# Patient Record
Sex: Male | Born: 1940
Health system: Southern US, Community
[De-identification: ages and names within clinical notes are randomized; demographics above are authoritative.]

## PROBLEM LIST (undated history)

## (undated) DIAGNOSIS — I4891 Unspecified atrial fibrillation: Secondary | ICD-10-CM

## (undated) DIAGNOSIS — N419 Inflammatory disease of prostate, unspecified: Secondary | ICD-10-CM

## (undated) DIAGNOSIS — I509 Heart failure, unspecified: Secondary | ICD-10-CM

## (undated) DIAGNOSIS — I639 Cerebral infarction, unspecified: Secondary | ICD-10-CM

## (undated) DIAGNOSIS — I8390 Asymptomatic varicose veins of unspecified lower extremity: Secondary | ICD-10-CM

## (undated) DIAGNOSIS — I2510 Atherosclerotic heart disease of native coronary artery without angina pectoris: Secondary | ICD-10-CM

## (undated) DIAGNOSIS — I714 Abdominal aortic aneurysm, without rupture, unspecified: Secondary | ICD-10-CM

## (undated) DIAGNOSIS — I10 Essential (primary) hypertension: Secondary | ICD-10-CM

## (undated) DIAGNOSIS — E079 Disorder of thyroid, unspecified: Secondary | ICD-10-CM

## (undated) DIAGNOSIS — I499 Cardiac arrhythmia, unspecified: Secondary | ICD-10-CM

## (undated) HISTORY — PX: INGUINAL HERNIA REPAIR: SUR1180

## (undated) HISTORY — DX: Atherosclerotic heart disease of native coronary artery without angina pectoris: I25.10

## (undated) HISTORY — DX: Asymptomatic varicose veins of unspecified lower extremity: I83.90

## (undated) HISTORY — DX: Unspecified atrial fibrillation: I48.91

## (undated) HISTORY — DX: Abdominal aortic aneurysm, without rupture, unspecified: I71.40

## (undated) HISTORY — DX: Inflammatory disease of prostate, unspecified: N41.9

## (undated) HISTORY — PX: ABLATION: SHX5711

## (undated) HISTORY — DX: Disorder of thyroid, unspecified: E07.9

## (undated) HISTORY — PX: TONSILLECTOMY AND ADENOIDECTOMY: SUR1326

## (undated) HISTORY — DX: Cardiac arrhythmia, unspecified: I49.9

## (undated) HISTORY — PX: ABDOMINAL AORTIC ANEURYSM REPAIR: SUR1152

## (undated) HISTORY — DX: Cerebral infarction, unspecified: I63.9

## (undated) HISTORY — DX: Heart failure, unspecified: I50.9

## (undated) HISTORY — DX: Abdominal aortic aneurysm, without rupture: I71.4

## (undated) HISTORY — DX: Essential (primary) hypertension: I10

---

## 1967-09-05 DIAGNOSIS — I639 Cerebral infarction, unspecified: Secondary | ICD-10-CM

## 1967-09-05 HISTORY — DX: Cerebral infarction, unspecified: I63.9

## 2006-10-15 ENCOUNTER — Ambulatory Visit: Payer: Self-pay | Admitting: Vascular Surgery

## 2006-11-20 ENCOUNTER — Ambulatory Visit: Payer: Self-pay | Admitting: Vascular Surgery

## 2006-11-21 ENCOUNTER — Inpatient Hospital Stay (HOSPITAL_COMMUNITY): Admission: RE | Admit: 2006-11-21 | Discharge: 2006-11-22 | Payer: Self-pay | Admitting: Vascular Surgery

## 2006-11-21 ENCOUNTER — Ambulatory Visit: Payer: Self-pay | Admitting: Vascular Surgery

## 2006-11-21 HISTORY — PX: CAROTID ENDARTERECTOMY: SUR193

## 2006-11-28 ENCOUNTER — Ambulatory Visit: Payer: Self-pay | Admitting: Vascular Surgery

## 2006-11-30 ENCOUNTER — Ambulatory Visit: Payer: Self-pay | Admitting: Vascular Surgery

## 2006-12-31 ENCOUNTER — Encounter: Admission: RE | Admit: 2006-12-31 | Discharge: 2006-12-31 | Payer: Self-pay | Admitting: Vascular Surgery

## 2006-12-31 ENCOUNTER — Ambulatory Visit: Payer: Self-pay | Admitting: Vascular Surgery

## 2007-02-12 ENCOUNTER — Ambulatory Visit: Payer: Self-pay | Admitting: Vascular Surgery

## 2007-06-28 ENCOUNTER — Encounter: Admission: RE | Admit: 2007-06-28 | Discharge: 2007-06-28 | Payer: Self-pay | Admitting: Vascular Surgery

## 2007-06-28 ENCOUNTER — Ambulatory Visit: Payer: Self-pay | Admitting: Vascular Surgery

## 2008-06-26 ENCOUNTER — Encounter: Admission: RE | Admit: 2008-06-26 | Discharge: 2008-06-26 | Payer: Self-pay

## 2008-06-26 ENCOUNTER — Ambulatory Visit: Payer: Self-pay | Admitting: Vascular Surgery

## 2009-07-16 ENCOUNTER — Ambulatory Visit: Payer: Self-pay | Admitting: Vascular Surgery

## 2011-01-17 NOTE — Procedures (Signed)
ENDOVASCULAR STENT GRAFT EXAM   INDICATION:  Followup of an abdominal aortic aneurysm repair endo stent.   HISTORY:                           DUPLEX EVALUATION   AAA Sac Size:                 5.5 CM AP             4.6 CM TRV  Previous Sac Size:            5.2 (CT) CM AP        CM TRV  Evidence of an endoleak?      No                    No   Velocity Criteria:  Proximal Aorta                66 cm/sec  Proximal Stent Graft          57 cm/sec  Main Body Stent Graft-Mid     55 cm/sec  Right Limb-Proximal           82 cm/sec  Right Limb-Distal             102 cm/sec  Left Limb-Proximal            87 cm/sec  Left Limb-Distal              88 cm/sec  Patent Renal Arteries?        Yes                   Yes   IMPRESSION:  1. No evidence of endo leak.  2. Patent graft with no evidence of stenosis.  3. Ankle brachial indices are within normal limits.   ___________________________________________  Larina Earthly, M.D.   CJ/MEDQ  D:  07/16/2009  T:  07/16/2009  Job:  [id number]

## 2011-01-17 NOTE — Assessment & Plan Note (Signed)
OFFICE VISIT   [name], [name]  DOB:  August 27, 1941                                       07/16/2009  ZOXWR#:[id number]   Allen [name] presents today for followup of his infrarenal abdominal  aortic aneurysm stent graft repair.  Surgery was in March 2008.  He has  no symptoms referable to his aneurysm.  He does have continued  difficulty with atrial fibrillation and is on chronic Coumadin for this.  He did have a TIA at that time of attempted ablation in the past as  well.  He also has history regarding thyroid management.  His review of  systems is otherwise unchanged.  He does not smoke having quit 15 years  ago.  He does have 2-4 alcohol drinks per week.   PHYSICAL EXAM:  Well-developed well-nourished white male appearing  stated age of 76.  He is in no acute distress.  Blood pressure is 127/84 pulse 59, temperature is 98.0.  HEENT:  His pupils are equal, round, and reactive to light.  His  extraocular movements are intact.  Conjunctivae are nonicteric.  NECK:  No JVD.  He has no bruits present.  He has no adenopathy.  LUNGS:  Clear bilaterally.  No rales, rhonchi, or wheezes.  CARDIOVASCULAR:  Heart is irregular with no murmurs present.  He does  have 2+ radial, 2+ femoral, 2+ popliteal, and 2+ dorsalis pedis pulses  bilaterally.  ABDOMINAL EXAM:  Soft, nontender.  No masses.  I do not feel an aneurysm  pulsation.  NEUROLOGICAL:  He is grossly intact with no focal weakness.   He did have questions regarding apparent .  She did have marked  calcification in his arteries at the time of cardiac catheterization.  I  explained that as long as he is not having deposits of plaque inside the  arteries that although this is not normal this should not cause any  difficulty.  He did undergo ultrasound today and I have reviewed this  and discussed this at length with [name].  I explained if this  shows no evidence of endoleak with all flow inside the  graft, he is not  having significant change his aneurysm size, and his ankle-arm index is  normal bilaterally.  I explained the significance of the ongoing  surveillance of his aneurysm and have recommended that we see him again  in 1 year with CAT scan for further evaluation.  He does have some calf  cramping and I explained that with normal ankle-arm indices that this is  not related to claudication.  He will see [place] again in 1 year.   Larina Earthly, M.D.  Electronically Signed   TFE/MEDQ  D:  07/16/2009  T:  07/19/2009  Job:  [id number]   cc:   Desmond Dike, M.D.

## 2011-01-17 NOTE — Assessment & Plan Note (Signed)
OFFICE VISIT   Allen, [name]  DOB:  1941-06-20                                       06/28/2007  ZOXWR#:[id number]   Allen [name] is in today for follow-up of his gore stent graft repair  of his abdominal aortic aneurysm 11/21/06. He continues to do quite  well. He reports that he has had some cardiac issues.  Underwent a  cardiac catheterization revealing a severe calcification but no stenosis  in his coronary vessels.  He has had no symptoms related to his  aneurysm.   PHYSICAL EXAMINATION:  Blood pressure is 117/77, pulse 72, respirations  16.  His radial pulses are 2+, femoral pulses are 2+, he has 2+  posterior tibial pulses bilaterally.  His popliteal pulses are normal  with no evidence of popliteal artery aneurysms.  Abdominal exam reveals  no evidence of pulsatile mass and no tenderness.  I reviewed his CAT  scan with him.  This shows maximal size of his aneurysm sack at 5.3 cm  which is slightly down from his preoperative study.  His stent graft is  in good positioning with no evidence of endo leak.  I am quite pleased  with his early follow-up and will see him again in one year with repeat  CAT scan at that time.   Larina Earthly, M.D.  Electronically Signed   TFE/MEDQ  D:  06/28/2007  T:  07/02/2007  Job:  [id number]   cc:   Debroah Baller, M.D.  Desmond Dike

## 2011-01-17 NOTE — Assessment & Plan Note (Signed)
OFFICE VISIT   Allen [name], Allen [name]  DOB:  1941-07-04                                       06/26/2008  NWGNF#:[id number]   The patient presents today for continued followup of his stent graft  repair of his infrarenal abdominal aortic aneurysm with a Gore Excluder  graft.  This was placed in March 2008.  Since my last visit with him, he  has had difficulty with cardiac arrhythmia, underwent percutaneous  treatment of this, but suffered slight stroke.  He reports that he has  had a finding of severe coronary calcific plaque, but no focal stenosis.  He is seen to be treated for hypertension.  He has no symptoms referable  to his aneurysm.   SOCIAL HISTORY:  He is divorced.  He is a retired Psychologist, occupational.  He quit  smoking 14 years ago and has 2 to 4 drinks per week.   REVIEW OF SYSTEMS:  Otherwise, negative.  He has no known allergies.   PHYSICAL EXAM:  He is a well developed, well nourished white male  appearing stated age of 62.  Blood pressure is 126/81, pulse 62,  respirations 18.  His groin incisions are well-healed bilaterally.  He  has 2+ femoral, 2+ popliteal, and 2+ posterior tibial pulses.  Abdominal  exam reveals no tenderness and no palpable masses.   He did undergo CAT scan today and I have reviewed this with him.  This  shows no evidence of endoleak.  He does have good positioning of his  stent graft and no enlargement of his aneurysm sac.  There does appear  to be slight shrinkage down to 5.2 cm.  I discussed all of this with the  patient.  I am quite pleased with his initial result.  We will see him  again in 1 year with ultrasound review of his aorta, and then in 2 years  for repeat CT scan.   Larina Earthly, M.D.  Electronically Signed   TFE/MEDQ  D:  06/26/2008  T:  06/29/2008  Job:  [id number]   cc:   Debroah Baller, M.D.  Dr. Desmond Dike

## 2011-01-20 NOTE — Op Note (Signed)
NAME:  ATHAN, CASALINO NO.:  [id number]   MEDICAL RECORD NO.:  [id number]           PATIENT TYPE:   LOCATION:                                 FACILITY:   PHYSICIAN:  Larina Earthly, M.D.         DATE OF BIRTH:   DATE OF PROCEDURE:  DATE OF DISCHARGE:                               OPERATIVE REPORT   PREOPERATIVE DIAGNOSIS:  Infrarenal abdominal aortic aneurysm.   POSTOPERATIVE DIAGNOSIS:  Infrarenal abdominal aortic aneurysm.   PROCEDURE:  Excluder stent graft repair of abdominal aortic aneurysm  using a 2-piece device.   SURGEON:  Dr. Tawanna Cooler Early.   ASSISTANT:  Dr. Fabienne Bruns.   ANESTHESIA:  General endotracheal.   COMPLICATIONS:  None.   DISPOSITION:  To recovery room in stable condition.   PROCEDURE IN DETAIL:  The patient was taken to the operating room and  placed in the supine position where the abdomen and both groins were  prepped and draped in the usual sterile fashion.  Oblique incisions were  made over the inguinal ligaments bilateral and carried down to isolate  the common femoral arteries bilaterally.  The left femoral cutdown  exposure and repair will be dictated as a separate note by Dr. Fabienne Bruns.  An 8-French sheath was passed through, with a Seldinger  technique, up to the level of the suprarenal aorta with a guidewire from  the right groin.  A marker pigtail catheter was positioned at the level  of the renal arteries and arteriogram was obtained showing positioning  of the renal arteries and the iliac artery and the iliac artery  bifurcation bilaterally.  A 26 diameter x 14-mm x 16-cm main body graft  was chosen.  This was positioned through the right groin.  A pigtail  catheter had an Amplatz Super Stiff wire placed through it and the  pigtail catheter was removed.  An 18-French sheath was passed over the  Amplatz Super Stiff wire and the main body device was positioned at the  level of the renal arteries.  A pigtail catheter  was positioned through  the left groin and again the arteriogram was exposed to show the level  of the renal arteries.  The main body device was deployed at the level  of the left renal artery which was the lowest renal artery.  Next the  pigtail catheter was pulled back down into the aneurysmal sac and a  pigtail catheter was exchanged for a headhunter catheter and a guidewire  and the contra-lateral gate was cannulated with the guidewire.  A  pigtail catheter was positioned over this and twirled in the main body  of the graft to confirm that this was indeed in the graft.  The contra-  lateral limb was 16-mm diameter x 11.5-cm length.  This was positioned  with the appropriate overlap and was deployed into the left iliac.  The  positioning of the hypogastric had been determined through a sheath  injection.  A CODA balloon was used to gently dilate the proximal distal  and junction anastomosis.  Completion of arteriogram  was obtained with  the pigtail catheter showing excellent positioning with good flow into  the native left renal artery and no evidence of Type 1 endo leak.  There  was a late blush from collaterals in the aneurysm sac.  The guidewire  and sheath were removed and the common femoral arteries were repaired  bilaterally with a running 5-0 and 6-0 Prolene sutures.  After  completion of this, the patient had been given  6000 units of intravenous heparin and this was reversed with 50 mg of  Protamine.  Wounds were irrigated with saline.  Excess cautery wounds  were closed with 3-0 Vicryl in the subcutaneous and subcuticular tissue.  Benzoin and Steri-Strips were applied.  The patient was taken to the  recovery room in stable condition.      Larina Earthly, M.D.  Electronically Signed     TFE/MEDQ  D:  11/21/2006  T:  11/21/2006  Job:  [id number]   cc:   Debroah Baller, M.D.  Norman Herrlich, MD  Desmond Dike, MD

## 2011-01-20 NOTE — Discharge Summary (Signed)
NAME:  Allen [name], STAN NO.:  [id number]   MEDICAL RECORD NO.:  [id number]          PATIENT TYPE:  INP   LOCATION:  2008                         FACILITY:  [facility]   PHYSICIAN:  Larina Earthly, M.D.    DATE OF BIRTH:  01/19/1941   DATE OF ADMISSION:  11/21/2006  DATE OF DISCHARGE:                               DISCHARGE SUMMARY   HISTORY OF PRESENT ILLNESS:  The patient is a 70 year old white male was  seen by Dr. Arbie Cookey on October 15, 2006, for evaluation of abdominal  aortic aneurysm, which was found on incidental findings during an  evaluation for chronic prostatitis.  A CT scan revealed an infrarenal  5.2 cm aneurysm.  It permeated at the level of the aortic bifurcation.  The patient elected after full discussion of all of his options to  undergo a skin graft repair and he was admitted this hospitalization for  the procedure.   PAST MEDICAL HISTORY:  1. Abdominal aortic aneurysm.  2. Chronic prostatitis.  3. Hypertension.  4. History of congestive heart failure.  5. Hypothyroidism.   PAST SURGICAL HISTORY:  1. Right inguinal hernia repair x4.  2. Tonsillectomy and adenoidectomy.  3. Circumcision.  4. History of some time of ablation that is nonspecific in the history      and physical.   ALLERGIES:  NO KNOWN DRUG ALLERGIES.   MEDICATIONS:  Prior to admission:  1. Lisinopril 20 mg daily.  2. Flomax 0.4 mg daily.  3. Aspirin 81 mg daily.  4. Metamucil 1 tablespoon q.day.  5. Synthroid 0.15 mg daily.  6. Valtrex 500 mg q.day.  7. Metoprolol 25 mg daily.   Family history, social history, review of systems, and physical  examination, please see the history and physical done at the time of  admission.   HOSPITAL COURSE:  The patient was admitted on November 21, 2006, taken to  the operating room at which time he underwent the placement of an  abdominal aortic aneurysm, gore excluder endoprosthetic stent graft.  This procedure was performed by Dr. Arbie Cookey,  tolerated well, and he was  taken to the post anesthesia care unit in stable condition.   POSTOPERATIVE HOSPITAL COURSE:  The patient has done remarkably well.  He has remained hemodynamically stable.   LABORATORY DATA:  Show a white blood cell count 13.4, hemoglobin and  hematocrit of 12 and 35 respectively with a platelet count of 213,000  postop day 1.  Electrolytes, BUN, and creatinine are all within normal  limits.  The incisions are healing well without evidence of infection or  hematoma.  The patient has palpable pedal pulses.  He has tolerated  routine advancement activity for level of postoperative convalescence  using standard protocols.  His oxygen has been weaned and he maintains  excellent saturations on room air.  He is afebrile.  He is tolerating  diet.  He is passing urine following the removal of his Foley catheter.  His overall status is felt to be stable for discharge on today's date  November 22, 2006.   CONDITION ON DISCHARGE:  Stable and improving.  DISCHARGE MEDICATIONS:  1. Include lisinopril 20 mg daily.  2. Synthroid 0.15 mg daily.  3. Valtrex 500 mg daily.  4. Metoprolol 25 mg daily.  5. Aspirin 1 daily as previously dosed.  6. Metamucil home dose.  7. Flomax 0.4 mg daily for pain.  8. Ultram 50 mg every 6 hours p.r.n.   FOLLOWUP:  Followup will be arranged for the patient to see Dr. Arbie Cookey at  the [facility] office.  This appointment will be called for the patient.   FINAL DIAGNOSIS:  Infrarenal abdominal aortic aneurysm now status post  stent graft repair.  Other diagnoses as previously left per the history.      Rowe Clack, P.A.-C.      Larina Earthly, M.D.  Electronically Signed    WEG/MEDQ  D:  11/22/2006  T:  11/22/2006  Job:  [id number]   cc:   Aundra Dubin. Revankar, M.D.  Geanie Berlin, M.D.

## 2011-01-20 NOTE — H&P (Signed)
NAME:  Allen [name], Allen [name] NO.:  [id number]   MEDICAL RECORD NO.:  [id number]          PATIENT TYPE:  INP   LOCATION:  NA                           FACILITY:  [facility]   PHYSICIAN:  Constance Holster, PA    DATE OF BIRTH:  [birth year]   DATE OF ADMISSION:  DATE OF DISCHARGE:                              HISTORY & PHYSICAL   PRIMARY DOCTOR:  Dr. Loralee Pacas.   CARDIOLOGIST:  Dr. Tomie China.   CHIEF COMPLAINT:  Abdominal aortic aneurysm.   HISTORY AND PHYSICAL:  This is a 70 year old Caucasian male who is seen  by Dr. Arbie Cookey on October 15, 2006, for evaluation of his abdominal  aortic aneurysm.  This was an incidental finding from a workup for  chronic prostatitis.  The patient was having a CT scan done which  revealed a 5.2-cm infrarenal abdominal aortic aneurysm.  This terminates  at the level of the aortic bifurcation.  Dr. Arbie Cookey recommended elective  repair of his abdominal aortic aneurysm.  After discussing open versus  stent graft, the decision was made for stent graft repair.  The patient  remains generally asymptomatic.  He denies any abdominal pain, no  nausea, no vomiting, hematochezia, hematemesis, claudication symptoms,  peripheral edema, dysuria, hematuria, reflux symptoms, angina,  palpitations, and TIA/CVA symptoms.  The patient does state that he has  had back pain for the past 10 years.  He notes this back pain when his  blood pressure is high.  He does not know if this is related to the  aneurysm or not.  The patient does also complain of constipation.  He  does have a history of arrhythmia.  The patient is unclear of the  arrhythmia.  However, it is thought to be atrial fibrillation, as he is  status post ablation.   The patient underwent a stress echocardiogram on October 24, 2006, at  [facility] which was negative inducible ischemia.  His left  ventricular function was within normal limits.  The patient presents  today for history and physical to  undergo elective repair of his  abdominal aortic aneurysm.  He will also have carotid Dopplers and ABIs  today in our office.   PAST MEDICAL HISTORY:  1. Abdominal aortic aneurysm.  2. Chronic prostatitis.  3. Hypertension.  4. Mild coronary artery disease with normal stress echocardiogram in      February of 2008.  5. History of congestive heart failure.  6. Hypothyroidism.   PAST SURGICAL HISTORY:  1. Right inguinal hernia repair times 4.  2. T and A.  3. Circumcision at age 39.  4. Ablation.   ALLERGIES:  No known drug allergies.   MEDICATIONS:  1. Lisinopril 20 mg p.o. daily.  2. Flomax 0.4 mg p.o. daily.  3. Aspirin 81 mg p.o. daily.  4. Metamucil 1 tablespoon daily.  5. Synthroid 0.15 mcg daily.  6. Valtrex 500 mg p.o. daily.  7. Metoprolol 25 mg p.o. daily.   REVIEW OF SYSTEMS:  See HPI for pertinent positive's and negative's.  Otherwise, negative for chronic obstructive pulmonary disease,  cerebrovascular accident, and myocardial infarction.  SOCIAL HISTORY:  The patient is single and lives alone.  He is a former  tobacco user.  However, he quit 12 years ago.  The patient does drink 4-  5 drinks per week.  He does still drive, and he lives in [place], [place].   FAMILY HISTORY:  Positive for cerebral aneurysm in his father.   PHYSICAL EXAMINATION:  VITAL SIGNS:  Blood pressure 103/80, heart rate  72, respirations 16.  GENERAL:  This is a 70 year old Caucasian male in non-acute distress.  HEENT:  Normocephalic and atraumatic.  Pupils are equal, round, and  reactive to light and accommodation.  Extraocular movements are intact.  Oral mucosa is pink and moist.  Sclerae are anicteric.  NECK:  Supple.  No carotid bruits heard on auscultation.  RESPIRATORY:  Symmetrical on aspiration.  Clear to auscultation  bilaterally.  CARDIAC:  Regular rate and rhythm.  No murmurs, rubs, or gallops.  ABDOMEN:  Soft, nontender, and nondistended.  Normoactive bowel  sounds  times 4.  GU/RECTAL:  Deferred.  EXTREMITIES:  No lower extremity edema.  Lower extremities are warm  bilaterally.  He has 2+ radial, femoral, popliteal, and posterior tibial  pulses bilaterally.  NEUROLOGIC:  Nonfocal.  Alert and oriented times 4.  Gait is steady.  Muscle strength is 5+ bilaterally and throughout.  Deep tendon reflexes  are 2+ and symmetrical.   ASSESSMENT:  A 5.2-cm infrarenal abdominal aortic aneurysm,  asymptomatic.   PLAN:  1. We will admit the patient to [facility] on November 21, 2006      under Dr. Bosie Helper service.  The patient will undergo abdominal      aortic Gore stent graft repair.  2. The risks and benefits were explained to the patient in great      detail.  Dr. Arbie Cookey has seen and evaluated the patient prior to his      admission and is in agreement with the above.      Constance Holster, PA     JMW/MEDQ  D:  11/20/2006  T:  11/20/2006  Job:  [id number]   cc:   Larina Earthly, M.D.

## 2011-01-20 NOTE — Op Note (Signed)
NAME:  Allen [name], Allen [name] NO.:  [id number]   MEDICAL RECORD NO.:  [id number]          PATIENT TYPE:  INP   LOCATION:  2550                         FACILITY:  [facility]   PHYSICIAN:  Janetta Hora. Fields, MD  DATE OF BIRTH:  12-14-1940   DATE OF PROCEDURE:  11/21/2006  DATE OF DISCHARGE:                               OPERATIVE REPORT   PROCEDURE:  Exposure and repair of left common femoral artery for  endovascular stent grafting.   PREOPERATIVE DIAGNOSIS:  Abdominal aortic aneurysm.   POSTOPERATIVE DIAGNOSIS:  Abdominal aortic aneurysm.   ANESTHESIA:  General.   OPERATIVE DETAILS:  After obtaining informed consent, the patient was  taken to the operating room.  The patient was placed in the supine  position on the operating table.  After induction of general anesthesia  and endotracheal intubation, a Foley catheter was placed.  Next, the  patient was prepped from the nipples to the knees.  An oblique incision  was made in the left groin just above the inguinal crease.  The incision  was carried down through the subcutaneous tissues down to the level of  the left common femoral artery.  This was dissected free  circumferentially.  Vessel loops were placed proximal and distal to the  site of cannulation.  Next, the left common femoral artery was  cannulated with the Majestic needle.  Guidewires and sheaths were placed  during the endovascular stent graft repair.  This is dictated separately  as an operative note by Dr. Arbie Cookey.  Next, after all sheaths and  guidewires were removed, the left common femoral artery was clamped  proximally with a Henley clamp.  It was controlled distally with a  vessel loop.  The arteriotomy was repaired using a running 6-0 Prolene  suture.  Just prior to completion of the anastomosis, this was forward  bled, back bled, and thoroughly flushed.  The anastomosis was secured,  clamps were released. There was good pulsatile flow in the femoral  artery immediately.  One additional 6-0 Prolene suture was placed as a  repair suture.  Next, the groin was closed in multiple layers with 2-0  and 3-0 Vicryl sutures.  The skin was then closed with 4-0 Vicryl  subcuticular stitch.  The patient tolerated the procedure well and there  were no complications.  Instrument, sponge and needle counts were  correct at the end of the case.  The patient was taken to the recovery  room in stable condition.      Janetta Hora. Fields, MD  Electronically Signed     CEF/MEDQ  D:  11/21/2006  T:  11/21/2006  Job:  [id number]

## 2011-09-05 HISTORY — PX: INGUINAL HERNIA REPAIR: SUR1180

## 2011-09-06 DIAGNOSIS — Z1211 Encounter for screening for malignant neoplasm of colon: Secondary | ICD-10-CM | POA: Diagnosis not present

## 2011-09-07 DIAGNOSIS — R109 Unspecified abdominal pain: Secondary | ICD-10-CM | POA: Diagnosis not present

## 2011-09-07 DIAGNOSIS — I2510 Atherosclerotic heart disease of native coronary artery without angina pectoris: Secondary | ICD-10-CM | POA: Diagnosis not present

## 2011-09-07 DIAGNOSIS — K4090 Unilateral inguinal hernia, without obstruction or gangrene, not specified as recurrent: Secondary | ICD-10-CM | POA: Diagnosis not present

## 2011-09-11 DIAGNOSIS — L820 Inflamed seborrheic keratosis: Secondary | ICD-10-CM | POA: Diagnosis not present

## 2011-09-11 DIAGNOSIS — D649 Anemia, unspecified: Secondary | ICD-10-CM | POA: Diagnosis not present

## 2011-09-20 DIAGNOSIS — R109 Unspecified abdominal pain: Secondary | ICD-10-CM | POA: Diagnosis not present

## 2011-09-20 DIAGNOSIS — Z79899 Other long term (current) drug therapy: Secondary | ICD-10-CM | POA: Diagnosis not present

## 2011-09-20 DIAGNOSIS — I10 Essential (primary) hypertension: Secondary | ICD-10-CM | POA: Diagnosis not present

## 2011-09-20 DIAGNOSIS — R339 Retention of urine, unspecified: Secondary | ICD-10-CM | POA: Diagnosis not present

## 2011-09-20 DIAGNOSIS — Z7901 Long term (current) use of anticoagulants: Secondary | ICD-10-CM | POA: Diagnosis not present

## 2011-09-20 DIAGNOSIS — K4091 Unilateral inguinal hernia, without obstruction or gangrene, recurrent: Secondary | ICD-10-CM | POA: Diagnosis not present

## 2011-09-20 DIAGNOSIS — R110 Nausea: Secondary | ICD-10-CM | POA: Diagnosis not present

## 2011-09-20 DIAGNOSIS — I4891 Unspecified atrial fibrillation: Secondary | ICD-10-CM | POA: Diagnosis not present

## 2011-09-25 DIAGNOSIS — N419 Inflammatory disease of prostate, unspecified: Secondary | ICD-10-CM | POA: Diagnosis not present

## 2011-09-25 DIAGNOSIS — R338 Other retention of urine: Secondary | ICD-10-CM | POA: Diagnosis not present

## 2011-09-25 DIAGNOSIS — N400 Enlarged prostate without lower urinary tract symptoms: Secondary | ICD-10-CM | POA: Diagnosis not present

## 2011-10-02 DIAGNOSIS — Z7901 Long term (current) use of anticoagulants: Secondary | ICD-10-CM | POA: Diagnosis not present

## 2011-10-02 DIAGNOSIS — I4892 Unspecified atrial flutter: Secondary | ICD-10-CM | POA: Diagnosis not present

## 2011-10-09 DIAGNOSIS — Z7901 Long term (current) use of anticoagulants: Secondary | ICD-10-CM | POA: Diagnosis not present

## 2011-10-09 DIAGNOSIS — I4891 Unspecified atrial fibrillation: Secondary | ICD-10-CM | POA: Diagnosis not present

## 2011-10-09 DIAGNOSIS — I4892 Unspecified atrial flutter: Secondary | ICD-10-CM | POA: Diagnosis not present

## 2011-10-23 DIAGNOSIS — Z7901 Long term (current) use of anticoagulants: Secondary | ICD-10-CM | POA: Diagnosis not present

## 2011-10-23 DIAGNOSIS — I4892 Unspecified atrial flutter: Secondary | ICD-10-CM | POA: Diagnosis not present

## 2011-11-20 DIAGNOSIS — Z7901 Long term (current) use of anticoagulants: Secondary | ICD-10-CM | POA: Diagnosis not present

## 2011-11-20 DIAGNOSIS — I4891 Unspecified atrial fibrillation: Secondary | ICD-10-CM | POA: Diagnosis not present

## 2011-11-27 DIAGNOSIS — N401 Enlarged prostate with lower urinary tract symptoms: Secondary | ICD-10-CM | POA: Diagnosis not present

## 2011-11-27 DIAGNOSIS — R21 Rash and other nonspecific skin eruption: Secondary | ICD-10-CM | POA: Diagnosis not present

## 2011-11-27 DIAGNOSIS — N411 Chronic prostatitis: Secondary | ICD-10-CM | POA: Diagnosis not present

## 2011-12-15 DIAGNOSIS — I4891 Unspecified atrial fibrillation: Secondary | ICD-10-CM | POA: Diagnosis not present

## 2011-12-15 DIAGNOSIS — Z7901 Long term (current) use of anticoagulants: Secondary | ICD-10-CM | POA: Diagnosis not present

## 2012-01-18 DIAGNOSIS — I4891 Unspecified atrial fibrillation: Secondary | ICD-10-CM | POA: Diagnosis not present

## 2012-01-18 DIAGNOSIS — Z7901 Long term (current) use of anticoagulants: Secondary | ICD-10-CM | POA: Diagnosis not present

## 2012-02-15 DIAGNOSIS — Z7901 Long term (current) use of anticoagulants: Secondary | ICD-10-CM | POA: Diagnosis not present

## 2012-02-15 DIAGNOSIS — I4892 Unspecified atrial flutter: Secondary | ICD-10-CM | POA: Diagnosis not present

## 2012-02-29 DIAGNOSIS — N401 Enlarged prostate with lower urinary tract symptoms: Secondary | ICD-10-CM | POA: Diagnosis not present

## 2012-02-29 DIAGNOSIS — N419 Inflammatory disease of prostate, unspecified: Secondary | ICD-10-CM | POA: Diagnosis not present

## 2012-02-29 DIAGNOSIS — Z125 Encounter for screening for malignant neoplasm of prostate: Secondary | ICD-10-CM | POA: Diagnosis not present

## 2012-03-04 DIAGNOSIS — H2510 Age-related nuclear cataract, unspecified eye: Secondary | ICD-10-CM | POA: Diagnosis not present

## 2012-03-18 DIAGNOSIS — I4891 Unspecified atrial fibrillation: Secondary | ICD-10-CM | POA: Diagnosis not present

## 2012-03-18 DIAGNOSIS — I4892 Unspecified atrial flutter: Secondary | ICD-10-CM | POA: Diagnosis not present

## 2012-03-18 DIAGNOSIS — I428 Other cardiomyopathies: Secondary | ICD-10-CM | POA: Diagnosis not present

## 2012-03-18 DIAGNOSIS — E785 Hyperlipidemia, unspecified: Secondary | ICD-10-CM | POA: Diagnosis not present

## 2012-03-19 DIAGNOSIS — I10 Essential (primary) hypertension: Secondary | ICD-10-CM | POA: Diagnosis not present

## 2012-03-19 DIAGNOSIS — I4892 Unspecified atrial flutter: Secondary | ICD-10-CM | POA: Diagnosis not present

## 2012-03-19 DIAGNOSIS — E785 Hyperlipidemia, unspecified: Secondary | ICD-10-CM | POA: Diagnosis not present

## 2012-04-10 DIAGNOSIS — D126 Benign neoplasm of colon, unspecified: Secondary | ICD-10-CM | POA: Diagnosis not present

## 2012-04-10 DIAGNOSIS — Z1211 Encounter for screening for malignant neoplasm of colon: Secondary | ICD-10-CM | POA: Diagnosis not present

## 2012-04-10 DIAGNOSIS — Z86010 Personal history of colonic polyps: Secondary | ICD-10-CM | POA: Diagnosis not present

## 2012-04-18 DIAGNOSIS — Z7901 Long term (current) use of anticoagulants: Secondary | ICD-10-CM | POA: Diagnosis not present

## 2012-04-18 DIAGNOSIS — I4892 Unspecified atrial flutter: Secondary | ICD-10-CM | POA: Diagnosis not present

## 2012-04-22 DIAGNOSIS — I4892 Unspecified atrial flutter: Secondary | ICD-10-CM | POA: Diagnosis not present

## 2012-04-22 DIAGNOSIS — Z7901 Long term (current) use of anticoagulants: Secondary | ICD-10-CM | POA: Diagnosis not present

## 2012-04-24 DIAGNOSIS — K921 Melena: Secondary | ICD-10-CM | POA: Diagnosis not present

## 2012-04-29 DIAGNOSIS — K921 Melena: Secondary | ICD-10-CM | POA: Diagnosis not present

## 2012-05-13 DIAGNOSIS — Z7901 Long term (current) use of anticoagulants: Secondary | ICD-10-CM | POA: Diagnosis not present

## 2012-05-13 DIAGNOSIS — I4891 Unspecified atrial fibrillation: Secondary | ICD-10-CM | POA: Diagnosis not present

## 2012-05-28 DIAGNOSIS — Z7901 Long term (current) use of anticoagulants: Secondary | ICD-10-CM | POA: Diagnosis not present

## 2012-05-28 DIAGNOSIS — I4891 Unspecified atrial fibrillation: Secondary | ICD-10-CM | POA: Diagnosis not present

## 2012-05-28 DIAGNOSIS — G459 Transient cerebral ischemic attack, unspecified: Secondary | ICD-10-CM | POA: Diagnosis not present

## 2012-05-31 DIAGNOSIS — K5900 Constipation, unspecified: Secondary | ICD-10-CM | POA: Diagnosis not present

## 2012-05-31 DIAGNOSIS — N419 Inflammatory disease of prostate, unspecified: Secondary | ICD-10-CM | POA: Diagnosis not present

## 2012-05-31 DIAGNOSIS — N401 Enlarged prostate with lower urinary tract symptoms: Secondary | ICD-10-CM | POA: Diagnosis not present

## 2012-06-11 DIAGNOSIS — Z7901 Long term (current) use of anticoagulants: Secondary | ICD-10-CM | POA: Diagnosis not present

## 2012-06-11 DIAGNOSIS — I4891 Unspecified atrial fibrillation: Secondary | ICD-10-CM | POA: Diagnosis not present

## 2012-06-11 DIAGNOSIS — I4892 Unspecified atrial flutter: Secondary | ICD-10-CM | POA: Diagnosis not present

## 2012-07-02 DIAGNOSIS — I4891 Unspecified atrial fibrillation: Secondary | ICD-10-CM | POA: Diagnosis not present

## 2012-07-02 DIAGNOSIS — Z7901 Long term (current) use of anticoagulants: Secondary | ICD-10-CM | POA: Diagnosis not present

## 2012-07-09 DIAGNOSIS — I4892 Unspecified atrial flutter: Secondary | ICD-10-CM | POA: Diagnosis not present

## 2012-07-09 DIAGNOSIS — Z7901 Long term (current) use of anticoagulants: Secondary | ICD-10-CM | POA: Diagnosis not present

## 2012-07-09 DIAGNOSIS — I4891 Unspecified atrial fibrillation: Secondary | ICD-10-CM | POA: Diagnosis not present

## 2012-07-15 ENCOUNTER — Encounter: Payer: Self-pay | Admitting: Vascular Surgery

## 2012-07-16 DIAGNOSIS — Z23 Encounter for immunization: Secondary | ICD-10-CM | POA: Diagnosis not present

## 2012-07-16 DIAGNOSIS — E039 Hypothyroidism, unspecified: Secondary | ICD-10-CM | POA: Diagnosis not present

## 2012-07-23 ENCOUNTER — Telehealth: Payer: Self-pay | Admitting: Vascular Surgery

## 2012-07-23 DIAGNOSIS — I4892 Unspecified atrial flutter: Secondary | ICD-10-CM | POA: Diagnosis not present

## 2012-07-23 DIAGNOSIS — I4891 Unspecified atrial fibrillation: Secondary | ICD-10-CM | POA: Diagnosis not present

## 2012-07-23 DIAGNOSIS — Z7901 Long term (current) use of anticoagulants: Secondary | ICD-10-CM | POA: Diagnosis not present

## 2012-07-23 NOTE — Telephone Encounter (Addendum)
Message copied by Shari Prows on Tue Jul 23, 2012  3:39 PM ------      Message from: Standley Dakins M      Created: Tue Jul 23, 2012 10:40 AM      Regarding: Schedule CTA       As per TFE/Chart audit pt needs a cta abd/pelvis follow up EVAR.  Patient is expecting your call.                  Juliette Alcide  I scheduled an appt for the above pt for 08/20/12 at 1:30pm. I have not scheduled the cta because I need further information from the patient. And also requested an order be placed into epic by Joyce Gross. I left a message for this patient regarding the above information as well as his insurance information,allergies,weight and diabetes as are required to schedule a cta. AWT

## 2012-07-24 ENCOUNTER — Other Ambulatory Visit: Payer: Self-pay | Admitting: *Deleted

## 2012-07-24 DIAGNOSIS — Z48812 Encounter for surgical aftercare following surgery on the circulatory system: Secondary | ICD-10-CM

## 2012-07-24 DIAGNOSIS — I714 Abdominal aortic aneurysm, without rupture: Secondary | ICD-10-CM

## 2012-07-29 DIAGNOSIS — I714 Abdominal aortic aneurysm, without rupture: Secondary | ICD-10-CM | POA: Diagnosis not present

## 2012-08-13 ENCOUNTER — Ambulatory Visit: Payer: Self-pay | Admitting: Vascular Surgery

## 2012-08-14 ENCOUNTER — Encounter: Payer: Self-pay | Admitting: Vascular Surgery

## 2012-08-19 ENCOUNTER — Encounter: Payer: Self-pay | Admitting: Vascular Surgery

## 2012-08-20 ENCOUNTER — Ambulatory Visit (INDEPENDENT_AMBULATORY_CARE_PROVIDER_SITE_OTHER): Payer: Medicare Other | Admitting: Vascular Surgery

## 2012-08-20 ENCOUNTER — Other Ambulatory Visit: Payer: Self-pay | Admitting: *Deleted

## 2012-08-20 ENCOUNTER — Ambulatory Visit
Admission: RE | Admit: 2012-08-20 | Discharge: 2012-08-20 | Disposition: A | Discharge status: Home / Self Care | Payer: Medicare Other | Source: Ambulatory Visit | Attending: Vascular Surgery | Admitting: Vascular Surgery

## 2012-08-20 ENCOUNTER — Encounter: Payer: Self-pay | Admitting: Vascular Surgery

## 2012-08-20 VITALS — BP 120/83 | HR 70 | Resp 18 | Ht 72.0 in | Wt 202.8 lb

## 2012-08-20 DIAGNOSIS — Z48812 Encounter for surgical aftercare following surgery on the circulatory system: Secondary | ICD-10-CM | POA: Diagnosis not present

## 2012-08-20 DIAGNOSIS — I714 Abdominal aortic aneurysm, without rupture, unspecified: Secondary | ICD-10-CM | POA: Insufficient documentation

## 2012-08-20 DIAGNOSIS — I83893 Varicose veins of bilateral lower extremities with other complications: Secondary | ICD-10-CM

## 2012-08-20 HISTORY — DX: Abdominal aortic aneurysm, without rupture, unspecified: I71.40

## 2012-08-20 MED ORDER — IOHEXOL 350 MG/ML SOLN
80.0000 mL | Freq: Once | INTRAVENOUS | Status: AC | PRN
  Administered 2012-08-20: 80 mL via INTRAVENOUS

## 2012-08-20 NOTE — Progress Notes (Signed)
The patient has today for followup of his stent graft repair of abdominal aortic aneurysm in 2008. He underwent a CT scan this morning and he is here for discussion of this today. He has no symptoms referable to this. His main problem is been recurrent inguinal hernias he has had multiple surgeries bilaterally regarding this. He has no symptoms regarding his aneurysm.  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 He reports these have become progressively painful over the last several years. He does have discomfort over these with prolonged standing and reports an achy sensation specifically over the veins at night after he has been on his feet for a great deal of time. He has not worn compression garments. He does not have any history of DVT or other difficulties. He does have some swelling in the left leg.  Past Medical History  Diagnosis Date  . Hypertension   . CVA (cerebral infarction) 1969  . Irregular heart beat   . AAA (abdominal aortic aneurysm)   . CHF (congestive heart failure)   . Prostatitis   . CAD (coronary artery disease)   . Thyroid disease     Hypothyrodidism  . Atrial fibrillation   . Varicose veins     History  Substance Use Topics  . Smoking status: Former Smoker    Types: Cigarettes    Quit date: 09/04/1993  . Smokeless tobacco: Never Used  . Alcohol Use: 0.0 oz/week    2-4 Glasses of wine per week    Family History  Problem Relation Age of Onset  . Heart disease Father     No Known Allergies  Current outpatient prescriptions:diclofenac (VOLTAREN) 75 MG EC tablet, Take 75 mg by mouth 2 (two) times daily. Takes 1 tablet 1 day after intercourse., Disp: , Rfl: ;  Levothyroxine Sodium (SYNTHROID PO), Take 175 mcg by mouth daily. , Disp: , Rfl: ;  lisinopril (PRINIVIL,ZESTRIL) 10 MG tablet, Take 10 mg by mouth daily., Disp: , Rfl: ;  metoprolol tartrate (LOPRESSOR) 25 MG tablet, Take 25 mg by mouth 2 (two) times daily.,  Disp: , Rfl:  sildenafil (VIAGRA) 25 MG tablet, Take 25 mg by mouth as needed., Disp: , Rfl: ;  silodosin (RAPAFLO) 8 MG CAPS capsule, Take 8 mg by mouth daily with breakfast. Pt. States he takes 1 tablet daily for 3 days after intercourse., Disp: , Rfl: ;  simvastatin (ZOCOR) 10 MG tablet, Take 10 mg by mouth at bedtime., Disp: , Rfl: ;  Sulfamethoxazole-Trimethoprim (BACTRIM PO), Take by mouth daily., Disp: , Rfl:  valACYclovir (VALTREX) 500 MG tablet, Take 400 mg by mouth 2 (two) times daily. Takes 1/2 tablet twice daily., Disp: , Rfl: ;  warfarin (COUMADIN) 4 MG tablet, Take 4 mg by mouth daily., Disp: , Rfl: ;  aspirin 81 MG tablet, Take 81 mg by mouth daily., Disp: , Rfl: ;  psyllium (METAMUCIL) 58.6 % powder, Take 1 packet by mouth 3 (three) times daily., Disp: , Rfl: ;  Tamsulosin HCl (FLOMAX) 0.4 MG CAPS, Take by mouth., Disp: , Rfl:   BP 120/83  Pulse 70  Resp 18  Ht 6' (1.829 m)  Wt 202 lb 12.8 oz (91.989 kg)  BMI 27.50 kg/m2  Body mass index is 27.50 kg/(m^2).       Physical exam: Well-developed well-nourished white male appearing stated age in no acute distress Heart regular rhythm without murmur Chest clear bilaterally without wheezes Neurologically he is grossly intact Abdomen soft nontender  he does have a nonpulsatile a palpable aneurysm. Femoral and popliteal pulses are 2+ bilaterally with no evidence of peripheral aneurysm He does have marked varicosities in his medial and posterior thigh extending to the popliteal space and down into his leg  CT scan was reviewed with the patient. This reveals continued diminished size and is negative aneurysm sac. This is now down to 3.9. He was 5.3 initially. There is no evidence of endoleak  Impression and plan stable followup of stent graft repair of abdominal aneurysm. I have recommended that we drop back to every 2 years CT scan for evaluation of this.  Patient does have symptomatic venous hypertension varicosities in his left  leg. We fitted today with thigh-high graduated compression garments 2030 mm mercury and instructed him on the use of these. We will see him again in 3 months to determine if this is successful treatment. He will also undergo formal venous duplex of his left leg at that time for further evaluation

## 2012-08-21 DIAGNOSIS — I4891 Unspecified atrial fibrillation: Secondary | ICD-10-CM | POA: Diagnosis not present

## 2012-08-21 DIAGNOSIS — Z7901 Long term (current) use of anticoagulants: Secondary | ICD-10-CM | POA: Diagnosis not present

## 2012-08-21 NOTE — Addendum Note (Signed)
Addended by: Sharee Pimple on: 08/21/2012 09:47 AM   Modules accepted: Orders

## 2012-09-05 DIAGNOSIS — Z79899 Other long term (current) drug therapy: Secondary | ICD-10-CM | POA: Diagnosis not present

## 2012-09-05 DIAGNOSIS — Z7901 Long term (current) use of anticoagulants: Secondary | ICD-10-CM | POA: Diagnosis not present

## 2012-09-05 DIAGNOSIS — I4891 Unspecified atrial fibrillation: Secondary | ICD-10-CM | POA: Diagnosis not present

## 2012-09-05 DIAGNOSIS — I4892 Unspecified atrial flutter: Secondary | ICD-10-CM | POA: Diagnosis not present

## 2012-09-09 DIAGNOSIS — N401 Enlarged prostate with lower urinary tract symptoms: Secondary | ICD-10-CM | POA: Diagnosis not present

## 2012-09-09 DIAGNOSIS — N419 Inflammatory disease of prostate, unspecified: Secondary | ICD-10-CM | POA: Diagnosis not present

## 2012-09-10 DIAGNOSIS — E039 Hypothyroidism, unspecified: Secondary | ICD-10-CM | POA: Diagnosis not present

## 2012-09-10 DIAGNOSIS — E7800 Pure hypercholesterolemia, unspecified: Secondary | ICD-10-CM | POA: Diagnosis not present

## 2012-09-10 DIAGNOSIS — I10 Essential (primary) hypertension: Secondary | ICD-10-CM | POA: Diagnosis not present

## 2012-09-26 DIAGNOSIS — I10 Essential (primary) hypertension: Secondary | ICD-10-CM | POA: Diagnosis not present

## 2012-09-26 DIAGNOSIS — I4891 Unspecified atrial fibrillation: Secondary | ICD-10-CM | POA: Diagnosis not present

## 2012-09-26 DIAGNOSIS — I4892 Unspecified atrial flutter: Secondary | ICD-10-CM | POA: Diagnosis not present

## 2012-09-26 DIAGNOSIS — E785 Hyperlipidemia, unspecified: Secondary | ICD-10-CM | POA: Diagnosis not present

## 2012-09-26 DIAGNOSIS — I8390 Asymptomatic varicose veins of unspecified lower extremity: Secondary | ICD-10-CM | POA: Diagnosis not present

## 2012-10-07 DIAGNOSIS — I4891 Unspecified atrial fibrillation: Secondary | ICD-10-CM | POA: Diagnosis not present

## 2012-10-07 DIAGNOSIS — Z7901 Long term (current) use of anticoagulants: Secondary | ICD-10-CM | POA: Diagnosis not present

## 2012-10-07 DIAGNOSIS — I4892 Unspecified atrial flutter: Secondary | ICD-10-CM | POA: Diagnosis not present

## 2012-10-28 DIAGNOSIS — I4892 Unspecified atrial flutter: Secondary | ICD-10-CM | POA: Diagnosis not present

## 2012-10-28 DIAGNOSIS — I4891 Unspecified atrial fibrillation: Secondary | ICD-10-CM | POA: Diagnosis not present

## 2012-10-28 DIAGNOSIS — Z7901 Long term (current) use of anticoagulants: Secondary | ICD-10-CM | POA: Diagnosis not present

## 2012-11-11 DIAGNOSIS — N419 Inflammatory disease of prostate, unspecified: Secondary | ICD-10-CM | POA: Diagnosis not present

## 2012-11-11 DIAGNOSIS — R3911 Hesitancy of micturition: Secondary | ICD-10-CM | POA: Diagnosis not present

## 2012-11-11 DIAGNOSIS — R351 Nocturia: Secondary | ICD-10-CM | POA: Diagnosis not present

## 2012-11-18 ENCOUNTER — Encounter: Payer: Self-pay | Admitting: Vascular Surgery

## 2012-11-19 ENCOUNTER — Ambulatory Visit (INDEPENDENT_AMBULATORY_CARE_PROVIDER_SITE_OTHER): Payer: Medicare Other | Admitting: Vascular Surgery

## 2012-11-19 ENCOUNTER — Encounter (INDEPENDENT_AMBULATORY_CARE_PROVIDER_SITE_OTHER): Payer: Medicare Other | Admitting: *Deleted

## 2012-11-19 ENCOUNTER — Encounter: Payer: Self-pay | Admitting: Vascular Surgery

## 2012-11-19 ENCOUNTER — Encounter: Payer: Medicare Other | Admitting: *Deleted

## 2012-11-19 ENCOUNTER — Other Ambulatory Visit: Payer: Self-pay | Admitting: *Deleted

## 2012-11-19 VITALS — BP 131/78 | HR 59 | Resp 18 | Ht 72.0 in | Wt 204.2 lb

## 2012-11-19 DIAGNOSIS — I83893 Varicose veins of bilateral lower extremities with other complications: Secondary | ICD-10-CM | POA: Diagnosis not present

## 2012-11-19 HISTORY — DX: Varicose veins of bilateral lower extremities with other complications: I83.893

## 2012-11-19 NOTE — Progress Notes (Signed)
Problems with Activities of Daily Living Secondary to Leg Pain  1. Allen [name] states that prolonged standing is very difficult for him due to leg pain.    2. Allen [name] states that he has difficulty falling asleep due to leg pain.  3.  Allen [name] states that traveling in automobile is very difficult due to leg pain.   Failure of  Conservative Therapy:  1. Worn 20-30 mm Hg thigh high compression hose >3 months with no relief of symptoms.  2. Frequently elevates legs-no relief of symptoms  3. Taken Ibuprofen 600 Mg TID with no relief of symptoms.  Patient tends to have bilateral lower surety discomfort despite graduated compression garment use. This is mainly over the large varicosities in his left medial thigh and right pretibial region. This causes him discomfort with prolonged standing and also an achy sensation when he goes to bed at night following long day of standing.  In reviewing his bilateral duplex. He does have gross reflux throughout his enlarged saphenous veins bilaterally and into these varicosities bilaterally.  I have recommended staged bilateral laser ablation of his great saphenous vein. The left leg is more symptomatic so we would proceed with this first. He understands this is under local anesthesia with tumescent anesthesia. He understands the slight risk of DVT associated with the procedure. He may require a staged Phlebectomy of the in large varicosities if he does not have complete resolution of his symptoms. We'll schedule this at his earliest convenience

## 2012-11-21 ENCOUNTER — Other Ambulatory Visit: Payer: Self-pay | Admitting: *Deleted

## 2012-11-21 DIAGNOSIS — I83893 Varicose veins of bilateral lower extremities with other complications: Secondary | ICD-10-CM

## 2012-11-26 ENCOUNTER — Other Ambulatory Visit: Payer: Self-pay | Admitting: *Deleted

## 2012-11-26 DIAGNOSIS — N401 Enlarged prostate with lower urinary tract symptoms: Secondary | ICD-10-CM | POA: Diagnosis not present

## 2012-11-26 DIAGNOSIS — R21 Rash and other nonspecific skin eruption: Secondary | ICD-10-CM | POA: Diagnosis not present

## 2012-11-26 DIAGNOSIS — I83893 Varicose veins of bilateral lower extremities with other complications: Secondary | ICD-10-CM

## 2012-11-27 ENCOUNTER — Encounter: Payer: Self-pay | Admitting: Vascular Surgery

## 2012-11-28 ENCOUNTER — Encounter: Payer: Self-pay | Admitting: Vascular Surgery

## 2012-11-28 ENCOUNTER — Ambulatory Visit (INDEPENDENT_AMBULATORY_CARE_PROVIDER_SITE_OTHER): Payer: Medicare Other | Admitting: Vascular Surgery

## 2012-11-28 ENCOUNTER — Other Ambulatory Visit: Payer: Self-pay | Admitting: *Deleted

## 2012-11-28 VITALS — BP 134/81 | HR 60 | Resp 16 | Ht 72.0 in | Wt 204.0 lb

## 2012-11-28 DIAGNOSIS — I83893 Varicose veins of bilateral lower extremities with other complications: Secondary | ICD-10-CM

## 2012-11-28 HISTORY — PX: ENDOVENOUS ABLATION SAPHENOUS VEIN W/ LASER: SUR449

## 2012-11-28 NOTE — Progress Notes (Signed)
Laser Ablation Procedure      Date: 11/28/2012    Giles Currie DOB:1941/05/19  Consent signed: Yes  Surgeon:T.F. Dwana Garin  Procedure: Laser Ablation: left Greater Saphenous Vein  BP 134/81  Pulse 60  Resp 16  Ht 6' (1.829 m)  Wt 204 lb (92.534 kg)  BMI 27.66 kg/m2  Start time: 8:35am   End time: 9:35am  Tumescent Anesthesia: 450 cc 0.9% NaCl with 50 cc Lidocaine HCL with 1% Epi and 15 cc 8.4% NaHCO3  Local Anesthesia: 3 cc Lidocaine HCL and NaHCO3 (ratio 2:1)  Continuous Mode: 15 WATTS [employer] 2667 JOULES Total Time2:57       Patient tolerated procedure well: Yes  Notes: Mr. Frith has not taken Coumadin since 11-23-2012. His last dose of Coumadin was on 11-22-2012.  Description of Procedure:  After marking the course of the saphenous vein and the secondary varicosities in the standing position, the patient was placed on the operating table in the supine position, and the left leg was prepped and draped in sterile fashion. Local anesthetic was administered, and under ultrasound guidance the saphenous vein was accessed with a micro needle and guide wire; then the micro puncture sheath was placed. A guide wire was inserted to the saphenofemoral junction, followed by a 5 french sheath.  The position of the sheath and then the laser fiber below the junction was confirmed using the ultrasound and visualization of the aiming beam.  Tumescent anesthesia was administered along the course of the saphenous vein using ultrasound guidance. Protective laser glasses were placed on the patient, and the laser was fired at at 15 watt continuous mode.  For a total of 2667 joules.  A steri strip was applied to the puncture site.       ABD pads and thigh high compression stockings were applied.  Ace wrap bandages were applied  at the top of the saphenofemoral junction.  Blood loss was less than 15 cc.  The patient ambulated out of the operating room having tolerated the procedure  well.  Uneventful laser ablation from proximal calf to just below the saphenofemoral junction

## 2012-11-29 ENCOUNTER — Telehealth: Payer: Self-pay | Admitting: *Deleted

## 2012-11-29 NOTE — Telephone Encounter (Signed)
11/29/2012  Time: 10:33 AM   Patient Name: Allen [name]  Patient of: T.F. Early  Procedure:Laser Ablation left greater saphenous vein  11-28-2012  Reached patient at home and checked  His status  Yes    Comments/Actions Taken: Mr. Arya states no problems with pain, bleeding, or swelling.  Reviewed post procedural instructions with him.  Reminded him of post laser ablation duplex and follow up appointment with Dr. Arbie Cookey on 12-05-2012.  Confirmed procedure date for endovenous laser ablation of second (right) leg for 12-26-2012.     @SIGNATURE @

## 2012-12-04 ENCOUNTER — Encounter: Payer: Self-pay | Admitting: Vascular Surgery

## 2012-12-05 ENCOUNTER — Ambulatory Visit (INDEPENDENT_AMBULATORY_CARE_PROVIDER_SITE_OTHER): Payer: Medicare Other | Admitting: Vascular Surgery

## 2012-12-05 ENCOUNTER — Encounter (INDEPENDENT_AMBULATORY_CARE_PROVIDER_SITE_OTHER): Payer: Medicare Other | Admitting: *Deleted

## 2012-12-05 ENCOUNTER — Encounter: Payer: Self-pay | Admitting: Vascular Surgery

## 2012-12-05 VITALS — BP 128/77 | HR 50 | Ht 72.0 in | Wt 205.7 lb

## 2012-12-05 DIAGNOSIS — I83893 Varicose veins of bilateral lower extremities with other complications: Secondary | ICD-10-CM

## 2012-12-05 DIAGNOSIS — Z48812 Encounter for surgical aftercare following surgery on the circulatory system: Secondary | ICD-10-CM | POA: Diagnosis not present

## 2012-12-05 NOTE — Progress Notes (Signed)
Patient presents today for followup of laser ablation of his left great saphenous vein from mid calf to saphenofemoral junction one week ago. He has been compliant with his graduated compression garments and use of ibuprofen. Has minimal discomfort associated with this  Past Medical History  Diagnosis Date  . Hypertension   . CVA (cerebral infarction) 1969  . Irregular heart beat   . AAA (abdominal aortic aneurysm)   . CHF (congestive heart failure)   . Prostatitis   . CAD (coronary artery disease)   . Thyroid disease     Hypothyrodidism  . Atrial fibrillation   . Varicose veins     History  Substance Use Topics  . Smoking status: Former Smoker    Types: Cigarettes    Quit date: 09/04/1993  . Smokeless tobacco: Never Used  . Alcohol Use: 0.0 oz/week    2-4 Glasses of wine per week    Family History  Problem Relation Age of Onset  . Heart disease Father     No Known Allergies  Current outpatient prescriptions:acyclovir (ZOVIRAX) 800 MG tablet, Take 400 mg by mouth 2 (two) times daily., Disp: , Rfl: ;  aspirin 81 MG tablet, Take 81 mg by mouth daily., Disp: , Rfl: ;  diclofenac (VOLTAREN) 75 MG EC tablet, Take 75 mg by mouth 2 (two) times daily. Takes 1 tablet 1 day after intercourse., Disp: , Rfl: ;  Levothyroxine Sodium (SYNTHROID PO), Take 175 mcg by mouth daily. , Disp: , Rfl:  lisinopril (PRINIVIL,ZESTRIL) 10 MG tablet, Take 20 mg by mouth daily. , Disp: , Rfl: ;  metoprolol tartrate (LOPRESSOR) 25 MG tablet, Take 25 mg by mouth 2 (two) times daily., Disp: , Rfl: ;  sildenafil (VIAGRA) 25 MG tablet, Take 25 mg by mouth as needed., Disp: , Rfl: ;  simvastatin (ZOCOR) 10 MG tablet, Take 10 mg by mouth at bedtime., Disp: , Rfl:  valACYclovir (VALTREX) 500 MG tablet, Take 400 mg by mouth 2 (two) times daily. Takes 1/2 tablet twice daily., Disp: , Rfl: ;  warfarin (COUMADIN) 4 MG tablet, Take 4 mg by mouth daily., Disp: , Rfl: ;  amoxicillin-clavulanate (AUGMENTIN) 500-125 MG per  tablet, Take 1 tablet by mouth 2 (two) times daily., Disp: , Rfl: ;  FINASTERIDE PO, Take 5 mg by mouth daily., Disp: , Rfl:  psyllium (METAMUCIL) 58.6 % powder, Take 1 packet by mouth 3 (three) times daily., Disp: , Rfl: ;  silodosin (RAPAFLO) 8 MG CAPS capsule, Take 8 mg by mouth daily with breakfast. Pt. States he takes 1 tablet daily for 3 days after intercourse., Disp: , Rfl: ;  Sulfamethoxazole-Trimethoprim (BACTRIM PO), Take by mouth daily., Disp: , Rfl: ;  Tamsulosin HCl (FLOMAX) 0.4 MG CAPS, Take by mouth., Disp: , Rfl:   BP 128/77  Pulse 50  Ht 6' (1.829 m)  Wt 205 lb 11.2 oz (93.305 kg)  BMI 27.89 kg/m2  SpO2 97%  Body mass index is 27.89 kg/(m^2).       Physical exam: Well-developed well-nourished gentleman in no acute distress. His ablation site shows minimal bruising and very mild erythema. The insertion site for the IV at the mid calf is healed.  Venous duplex today reveals closure of the saphenous vein from the insertion site mid calf to approximate 1 cm below the saphenofemoral junction. There is no evidence of DVT  Impression and plan: Excellent [facility] result from ablation of left great saphenous vein. He does have some decompression in the varicosities in his medial  thigh. He will continue his compression garment for one additional week. He is scheduled for similar treatment to the right great saphenous vein on 12/26/2012.

## 2012-12-09 DIAGNOSIS — N419 Inflammatory disease of prostate, unspecified: Secondary | ICD-10-CM | POA: Diagnosis not present

## 2012-12-09 DIAGNOSIS — N401 Enlarged prostate with lower urinary tract symptoms: Secondary | ICD-10-CM | POA: Diagnosis not present

## 2012-12-25 ENCOUNTER — Encounter: Payer: Self-pay | Admitting: Vascular Surgery

## 2012-12-26 ENCOUNTER — Ambulatory Visit (INDEPENDENT_AMBULATORY_CARE_PROVIDER_SITE_OTHER): Payer: Medicare Other | Admitting: Vascular Surgery

## 2012-12-26 ENCOUNTER — Encounter: Payer: Self-pay | Admitting: Vascular Surgery

## 2012-12-26 VITALS — BP 129/77 | HR 56 | Resp 16 | Ht 72.5 in | Wt 204.0 lb

## 2012-12-26 DIAGNOSIS — I83893 Varicose veins of bilateral lower extremities with other complications: Secondary | ICD-10-CM

## 2012-12-26 HISTORY — PX: ENDOVENOUS ABLATION SAPHENOUS VEIN W/ LASER: SUR449

## 2012-12-26 NOTE — Progress Notes (Signed)
Laser Ablation Procedure      Date: 12/26/2012    Allen [name] DOB:1940/09/14  Consent signed: Yes  Surgeon:T.F. Taige Housman  Procedure: Laser Ablation: right Greater Saphenous Vein  BP 129/77  Pulse 56  Resp 16  Ht 6' 0.5" (1.842 m)  Wt 204 lb (92.534 kg)  BMI 27.27 kg/m2  Start time: 8:40AM   End time: 9:35AM  Tumescent Anesthesia: 475 cc 0.9% NaCl with 50 cc Lidocaine HCL with 1% Epi and 15 cc 8.4% NaHCO3  Local Anesthesia: 4 cc Lidocaine HCL and NaHCO3 (ratio 2:1)  Continuous Mode: 15 Watts [employer] 2506 Joules Total Time2:47       Patient tolerated procedure well: Yes  Notes: [name] last dose of Coumadin was on 12-20-2012.  Description of Procedure:  After marking the course of the saphenous vein and the secondary varicosities in the standing position, the patient was placed on the operating table in the supine position, and the right leg was prepped and draped in sterile fashion. Local anesthetic was administered, and under ultrasound guidance the saphenous vein was accessed with a micro needle and guide wire; then the micro puncture sheath was placed. A guide wire was inserted to the saphenofemoral junction, followed by a 5 french sheath.  The position of the sheath and then the laser fiber below the junction was confirmed using the ultrasound and visualization of the aiming beam.  Tumescent anesthesia was administered along the course of the saphenous vein using ultrasound guidance. Protective laser glasses were placed on the patient, and the laser was fired at at 15 watt continuous mode.  For a total of 2506 joules.  A steri strip was applied to the puncture site.      ABD pads and thigh high compression stockings were applied.  Ace wrap bandages were applied  at the top of the saphenofemoral junction.  Blood loss was less than 15 cc.  The patient ambulated out of the operating room having tolerated the procedure well.  Followup Uneventful ablation of his  right great saphenous vein. He had similar treatment of left leg several weeks ago with no difficulty. This was from his proximal calf to just below the saphenofemoral junction. Will be seen again in one week for

## 2012-12-31 ENCOUNTER — Telehealth: Payer: Self-pay | Admitting: *Deleted

## 2012-12-31 NOTE — Telephone Encounter (Signed)
12/31/2012  Time: 1:50 PM   Patient Name: Allen [name]  Patient of: T.F. Early  Procedure:Laser Ablation right greater saphenous vein  12-26-2012  Reached patient at home and checked  His status  Yes    Comments/Actions Taken: [name] states that he has no problems with pain, swelling, or bleeding of right leg.  States he slept well last night.  Reviewed post procedural instructions and reminded him of follow up post laser ablation duplex and appointment with Dr. Arbie Cookey on 01-02-2013.    @SIGNATURE @

## 2013-01-01 ENCOUNTER — Encounter: Payer: Self-pay | Admitting: Vascular Surgery

## 2013-01-02 ENCOUNTER — Encounter (INDEPENDENT_AMBULATORY_CARE_PROVIDER_SITE_OTHER): Payer: Medicare Other | Admitting: *Deleted

## 2013-01-02 ENCOUNTER — Encounter: Payer: Self-pay | Admitting: Vascular Surgery

## 2013-01-02 ENCOUNTER — Ambulatory Visit (INDEPENDENT_AMBULATORY_CARE_PROVIDER_SITE_OTHER): Payer: Medicare Other | Admitting: Vascular Surgery

## 2013-01-02 VITALS — BP 142/89 | HR 56 | Resp 18 | Ht 72.5 in | Wt 204.0 lb

## 2013-01-02 DIAGNOSIS — Z48812 Encounter for surgical aftercare following surgery on the circulatory system: Secondary | ICD-10-CM

## 2013-01-02 DIAGNOSIS — I83893 Varicose veins of bilateral lower extremities with other complications: Secondary | ICD-10-CM | POA: Diagnosis not present

## 2013-01-02 DIAGNOSIS — I4891 Unspecified atrial fibrillation: Secondary | ICD-10-CM | POA: Diagnosis not present

## 2013-01-02 DIAGNOSIS — Z7901 Long term (current) use of anticoagulants: Secondary | ICD-10-CM | POA: Diagnosis not present

## 2013-01-02 NOTE — Progress Notes (Signed)
The patient presents today for followup of his laser ablation of his right great saphenous vein. He undergone similar treatment to his left great saphenous vein several weeks prior. He has minimal discomfort associated with this. He has been compliant with his compression garments.  Distal exam he has a minimal bruising associated with this. He does continue to have some tributary varicosities in his left thigh.  Venous duplex today reveals successful ablation of his right great saphenous vein from the distal insertion to just below the saphenofemoral junction.  Reviewed the study with the patient. He will wear his compression for one additional week and then an as-needed basis. He is still having some discomfort over tributary varicosities on the left. We will see him again in 3 months to determine if stab phlebectomy will be required.

## 2013-01-06 DIAGNOSIS — I4891 Unspecified atrial fibrillation: Secondary | ICD-10-CM | POA: Diagnosis not present

## 2013-01-06 DIAGNOSIS — Z7901 Long term (current) use of anticoagulants: Secondary | ICD-10-CM | POA: Diagnosis not present

## 2013-01-09 DIAGNOSIS — Z7901 Long term (current) use of anticoagulants: Secondary | ICD-10-CM | POA: Diagnosis not present

## 2013-01-09 DIAGNOSIS — R351 Nocturia: Secondary | ICD-10-CM | POA: Diagnosis not present

## 2013-01-09 DIAGNOSIS — N419 Inflammatory disease of prostate, unspecified: Secondary | ICD-10-CM | POA: Diagnosis not present

## 2013-01-09 DIAGNOSIS — I4891 Unspecified atrial fibrillation: Secondary | ICD-10-CM | POA: Diagnosis not present

## 2013-01-13 DIAGNOSIS — I4891 Unspecified atrial fibrillation: Secondary | ICD-10-CM | POA: Diagnosis not present

## 2013-01-13 DIAGNOSIS — Z7901 Long term (current) use of anticoagulants: Secondary | ICD-10-CM | POA: Diagnosis not present

## 2013-01-20 DIAGNOSIS — Z7901 Long term (current) use of anticoagulants: Secondary | ICD-10-CM | POA: Diagnosis not present

## 2013-01-20 DIAGNOSIS — I4892 Unspecified atrial flutter: Secondary | ICD-10-CM | POA: Diagnosis not present

## 2013-01-20 DIAGNOSIS — I4891 Unspecified atrial fibrillation: Secondary | ICD-10-CM | POA: Diagnosis not present

## 2013-02-03 DIAGNOSIS — Z7901 Long term (current) use of anticoagulants: Secondary | ICD-10-CM | POA: Diagnosis not present

## 2013-02-03 DIAGNOSIS — I4891 Unspecified atrial fibrillation: Secondary | ICD-10-CM | POA: Diagnosis not present

## 2013-03-03 ENCOUNTER — Encounter: Payer: Self-pay | Admitting: Vascular Surgery

## 2013-03-03 DIAGNOSIS — Z125 Encounter for screening for malignant neoplasm of prostate: Secondary | ICD-10-CM | POA: Diagnosis not present

## 2013-03-03 DIAGNOSIS — N401 Enlarged prostate with lower urinary tract symptoms: Secondary | ICD-10-CM | POA: Diagnosis not present

## 2013-03-03 DIAGNOSIS — N419 Inflammatory disease of prostate, unspecified: Secondary | ICD-10-CM | POA: Diagnosis not present

## 2013-03-04 ENCOUNTER — Encounter: Payer: Self-pay | Admitting: Vascular Surgery

## 2013-03-04 ENCOUNTER — Ambulatory Visit (INDEPENDENT_AMBULATORY_CARE_PROVIDER_SITE_OTHER): Payer: Medicare Other | Admitting: Vascular Surgery

## 2013-03-04 VITALS — BP 125/79 | HR 58 | Resp 18 | Ht 72.5 in | Wt 206.2 lb

## 2013-03-04 DIAGNOSIS — R109 Unspecified abdominal pain: Secondary | ICD-10-CM | POA: Diagnosis not present

## 2013-03-04 DIAGNOSIS — I83893 Varicose veins of bilateral lower extremities with other complications: Secondary | ICD-10-CM | POA: Diagnosis not present

## 2013-03-04 DIAGNOSIS — T148XXA Other injury of unspecified body region, initial encounter: Secondary | ICD-10-CM | POA: Diagnosis not present

## 2013-03-04 NOTE — Progress Notes (Signed)
Here today for followup of staged bilateral laser ablation of great saphenous veins. He has remained quite active. He does have improvement of his symptoms but continues to have a large plexus varicosities in his left posterior thigh extending past the popliteal space into his calf. This is causing him discomfort with prolonged standing despite continued use of elevation and compression. He has no symptoms of DVT.  Past Medical History  Diagnosis Date  . Hypertension   . CVA (cerebral infarction) 1969  . Irregular heart beat   . AAA (abdominal aortic aneurysm)   . CHF (congestive heart failure)   . Prostatitis   . CAD (coronary artery disease)   . Thyroid disease     Hypothyrodidism  . Atrial fibrillation   . Varicose veins     History  Substance Use Topics  . Smoking status: Former Smoker    Types: Cigarettes    Quit date: 09/04/1993  . Smokeless tobacco: Never Used  . Alcohol Use: 0.0 oz/week    2-4 Glasses of wine per week    Family History  Problem Relation Age of Onset  . Heart disease Father     No Known Allergies  Current outpatient prescriptions:acyclovir (ZOVIRAX) 800 MG tablet, Take 400 mg by mouth 2 (two) times daily., Disp: , Rfl: ;  FINASTERIDE PO, Take 5 mg by mouth daily., Disp: , Rfl: ;  Levothyroxine Sodium (SYNTHROID PO), Take 175 mcg by mouth daily. , Disp: , Rfl: ;  lisinopril (PRINIVIL,ZESTRIL) 10 MG tablet, Take 20 mg by mouth daily. , Disp: , Rfl:  metoprolol tartrate (LOPRESSOR) 25 MG tablet, Take 25 mg by mouth 2 (two) times daily., Disp: , Rfl: ;  sildenafil (VIAGRA) 25 MG tablet, Take 25 mg by mouth as needed., Disp: , Rfl: ;  silodosin (RAPAFLO) 8 MG CAPS capsule, Take 8 mg by mouth daily with breakfast. Pt. States he takes 1 tablet daily for 3 days after intercourse., Disp: , Rfl: ;  simvastatin (ZOCOR) 10 MG tablet, Take 10 mg by mouth at bedtime., Disp: , Rfl:  warfarin (COUMADIN) 4 MG tablet, Take 4 mg by mouth daily., Disp: , Rfl: ;   amoxicillin-clavulanate (AUGMENTIN) 500-125 MG per tablet, Take 1 tablet by mouth 2 (two) times daily., Disp: , Rfl: ;  aspirin 81 MG tablet, Take 81 mg by mouth daily., Disp: , Rfl: ;  diclofenac (VOLTAREN) 75 MG EC tablet, Take 75 mg by mouth 2 (two) times daily. Takes 1 tablet 1 day after intercourse., Disp: , Rfl:  psyllium (METAMUCIL) 58.6 % powder, Take 1 packet by mouth 3 (three) times daily., Disp: , Rfl: ;  Sulfamethoxazole-Trimethoprim (BACTRIM PO), Take by mouth daily., Disp: , Rfl: ;  Tamsulosin HCl (FLOMAX) 0.4 MG CAPS, Take by mouth., Disp: , Rfl: ;  valACYclovir (VALTREX) 500 MG tablet, Take 400 mg by mouth 2 (two) times daily. Takes 1/2 tablet twice daily., Disp: , Rfl:   BP 125/79  Pulse 58  Resp 18  Ht 6' 0.5" (1.842 m)  Wt 206 lb 3.2 oz (93.532 kg)  BMI 27.57 kg/m2  Body mass index is 27.57 kg/(m^2).       Physical exam well-developed gentleman in no acute distress Respirations nonlabored Neurologically he is grossly intact Palpable 2+ dorsalis pedis pulses bilaterally Large plexus of veins in the posterior left thigh popliteal space and calf  Impression and plan improved venous hypertension symptoms related to his laser ablation with persistent pain over the tributary varicosities. I have recommended stab phlebectomy of  these varicosities for relief of his symptoms. He understands this is an outpatient procedure. He will hold his Coumadin treatment for several days around the procedure. We will coordinate this at his earliest convenience

## 2013-03-05 DIAGNOSIS — H2589 Other age-related cataract: Secondary | ICD-10-CM | POA: Diagnosis not present

## 2013-03-06 DIAGNOSIS — I4891 Unspecified atrial fibrillation: Secondary | ICD-10-CM | POA: Diagnosis not present

## 2013-03-06 DIAGNOSIS — I4892 Unspecified atrial flutter: Secondary | ICD-10-CM | POA: Diagnosis not present

## 2013-03-06 DIAGNOSIS — Z7901 Long term (current) use of anticoagulants: Secondary | ICD-10-CM | POA: Diagnosis not present

## 2013-03-12 ENCOUNTER — Encounter: Payer: Self-pay | Admitting: Vascular Surgery

## 2013-03-13 ENCOUNTER — Encounter: Payer: Self-pay | Admitting: Vascular Surgery

## 2013-03-13 ENCOUNTER — Ambulatory Visit (INDEPENDENT_AMBULATORY_CARE_PROVIDER_SITE_OTHER): Payer: Medicare Other | Admitting: Vascular Surgery

## 2013-03-13 VITALS — BP 123/79 | HR 57 | Resp 16 | Ht 72.0 in | Wt 204.0 lb

## 2013-03-13 DIAGNOSIS — I83893 Varicose veins of bilateral lower extremities with other complications: Secondary | ICD-10-CM | POA: Diagnosis not present

## 2013-03-13 HISTORY — PX: OTHER SURGICAL HISTORY: SHX169

## 2013-03-13 NOTE — Progress Notes (Signed)
Stab Phlebectomy Procedure      Date: 03/13/2013    Allen [name] DOB:09-10-1940  Consent signed: Yes  Surgeon:T.F. Early    BP 123/79  Pulse 57  Resp 16  Ht 6' (1.829 m)  Wt 204 lb (92.534 kg)  BMI 27.66 kg/m2  Start time: 8:35am   End time: 9:30am  Tumescent Anesthesia: 200 cc 0.9% NaCl with 50 cc Lidocaine HCL with 1% Epi and 15 cc 8.4% NaHCO3  Local Anesthesia: 1 cc Lidocaine HCL and NaHCO3 (ratio 2:1)      Stab Phlebectomy: 10-20 incisions Sites: Thigh and Calf left leg  Patient tolerated procedure well: Yes  Notes: [name] states that his last dose of Coumadin was on 03-06-2013.  Description of Procedure:  After marking the course of the saphenous vein and the secondary varicosities in the standing position, the patient was placed on the operating table in the prone position, and the left leg was prepped and draped in sterile fashion. Local anesthetic was administered, The patient was then put into Trendelenburg position.  Local anesthetic was utilized overlying the marked varicosities.  Greater than 10-20 stab wounds were made using the tip of an 11 blade; and using the vein hook,  The phlebectomies were performed using a hemostat to avulse these varicosities.  Adequate hemostasis was achieved, and steri strips were applied to the stab wound.      ABD pads and thigh high compression stockings were applied.  Ace wrap bandages were applied over the phlebectomy sites.   Blood loss was less than 15 cc.  The patient ambulated out of the operating room having tolerated the procedure well.

## 2013-03-14 ENCOUNTER — Encounter: Payer: Self-pay | Admitting: Vascular Surgery

## 2013-03-20 DIAGNOSIS — I4891 Unspecified atrial fibrillation: Secondary | ICD-10-CM | POA: Diagnosis not present

## 2013-03-20 DIAGNOSIS — Z7901 Long term (current) use of anticoagulants: Secondary | ICD-10-CM | POA: Diagnosis not present

## 2013-03-24 ENCOUNTER — Telehealth: Payer: Self-pay | Admitting: *Deleted

## 2013-03-24 NOTE — Telephone Encounter (Signed)
03/24/2013  Time: 4:23 PM   Patient Name: Allen [name]  Patient of: T.F. Early  Procedure:stab phlebectomy 10-20 incisions left leg left  Reached patient at home and checked  His status  Yes    Comments/Actions Taken: [name] states that he is having no problems with bleeding or oozing. He states that he is experiencing mild/moderate pain around stab phlebectomy sites left leg. Encouraged him to elevate his left leg, keep compression dressing on left leg as directed, take Tylenol as needed for discomfort, use ice compression on left leg prn pain. Reviewed post procedural instructions with him and reminded him of follow up appointment with Dr. Arbie Cookey on 03-25-2013.     @SIGNATURE @

## 2013-03-25 ENCOUNTER — Ambulatory Visit: Payer: Medicare Other | Admitting: Vascular Surgery

## 2013-03-25 DIAGNOSIS — I4891 Unspecified atrial fibrillation: Secondary | ICD-10-CM | POA: Diagnosis not present

## 2013-03-25 DIAGNOSIS — Z7901 Long term (current) use of anticoagulants: Secondary | ICD-10-CM | POA: Diagnosis not present

## 2013-04-02 DIAGNOSIS — E785 Hyperlipidemia, unspecified: Secondary | ICD-10-CM | POA: Diagnosis not present

## 2013-04-02 DIAGNOSIS — Z7901 Long term (current) use of anticoagulants: Secondary | ICD-10-CM | POA: Diagnosis not present

## 2013-04-02 DIAGNOSIS — I4891 Unspecified atrial fibrillation: Secondary | ICD-10-CM | POA: Diagnosis not present

## 2013-04-02 DIAGNOSIS — I10 Essential (primary) hypertension: Secondary | ICD-10-CM | POA: Diagnosis not present

## 2013-04-03 DIAGNOSIS — I2510 Atherosclerotic heart disease of native coronary artery without angina pectoris: Secondary | ICD-10-CM | POA: Diagnosis not present

## 2013-04-03 DIAGNOSIS — R109 Unspecified abdominal pain: Secondary | ICD-10-CM | POA: Diagnosis not present

## 2013-04-03 DIAGNOSIS — I10 Essential (primary) hypertension: Secondary | ICD-10-CM | POA: Diagnosis not present

## 2013-04-03 DIAGNOSIS — J069 Acute upper respiratory infection, unspecified: Secondary | ICD-10-CM | POA: Diagnosis not present

## 2013-04-03 DIAGNOSIS — E039 Hypothyroidism, unspecified: Secondary | ICD-10-CM | POA: Diagnosis not present

## 2013-04-08 ENCOUNTER — Ambulatory Visit: Payer: Medicare Other | Admitting: Vascular Surgery

## 2013-04-15 DIAGNOSIS — Z7901 Long term (current) use of anticoagulants: Secondary | ICD-10-CM | POA: Diagnosis not present

## 2013-04-15 DIAGNOSIS — I4891 Unspecified atrial fibrillation: Secondary | ICD-10-CM | POA: Diagnosis not present

## 2013-04-16 DIAGNOSIS — D126 Benign neoplasm of colon, unspecified: Secondary | ICD-10-CM | POA: Diagnosis not present

## 2013-04-16 DIAGNOSIS — K5730 Diverticulosis of large intestine without perforation or abscess without bleeding: Secondary | ICD-10-CM | POA: Diagnosis not present

## 2013-04-16 DIAGNOSIS — Z86010 Personal history of colonic polyps: Secondary | ICD-10-CM | POA: Diagnosis not present

## 2013-04-24 DIAGNOSIS — N401 Enlarged prostate with lower urinary tract symptoms: Secondary | ICD-10-CM | POA: Diagnosis not present

## 2013-04-24 DIAGNOSIS — N419 Inflammatory disease of prostate, unspecified: Secondary | ICD-10-CM | POA: Diagnosis not present

## 2013-04-28 DIAGNOSIS — Z7901 Long term (current) use of anticoagulants: Secondary | ICD-10-CM | POA: Diagnosis not present

## 2013-04-28 DIAGNOSIS — I4891 Unspecified atrial fibrillation: Secondary | ICD-10-CM | POA: Diagnosis not present

## 2013-05-08 DIAGNOSIS — N419 Inflammatory disease of prostate, unspecified: Secondary | ICD-10-CM | POA: Diagnosis not present

## 2013-05-08 DIAGNOSIS — N401 Enlarged prostate with lower urinary tract symptoms: Secondary | ICD-10-CM | POA: Diagnosis not present

## 2013-05-09 DIAGNOSIS — Z7901 Long term (current) use of anticoagulants: Secondary | ICD-10-CM | POA: Diagnosis not present

## 2013-05-09 DIAGNOSIS — I4891 Unspecified atrial fibrillation: Secondary | ICD-10-CM | POA: Diagnosis not present

## 2013-06-03 DIAGNOSIS — Z23 Encounter for immunization: Secondary | ICD-10-CM | POA: Diagnosis not present

## 2013-06-07 DIAGNOSIS — R079 Chest pain, unspecified: Secondary | ICD-10-CM | POA: Diagnosis not present

## 2013-06-07 DIAGNOSIS — I4891 Unspecified atrial fibrillation: Secondary | ICD-10-CM | POA: Diagnosis not present

## 2013-06-07 DIAGNOSIS — R002 Palpitations: Secondary | ICD-10-CM | POA: Diagnosis not present

## 2013-06-07 DIAGNOSIS — E7800 Pure hypercholesterolemia, unspecified: Secondary | ICD-10-CM | POA: Diagnosis not present

## 2013-06-07 DIAGNOSIS — R5381 Other malaise: Secondary | ICD-10-CM | POA: Diagnosis not present

## 2013-06-07 DIAGNOSIS — I10 Essential (primary) hypertension: Secondary | ICD-10-CM | POA: Diagnosis not present

## 2013-06-09 DIAGNOSIS — Z7901 Long term (current) use of anticoagulants: Secondary | ICD-10-CM | POA: Diagnosis not present

## 2013-06-09 DIAGNOSIS — I4891 Unspecified atrial fibrillation: Secondary | ICD-10-CM | POA: Diagnosis not present

## 2013-06-12 DIAGNOSIS — R52 Pain, unspecified: Secondary | ICD-10-CM | POA: Diagnosis not present

## 2013-06-12 DIAGNOSIS — I10 Essential (primary) hypertension: Secondary | ICD-10-CM | POA: Diagnosis not present

## 2013-06-12 DIAGNOSIS — I4891 Unspecified atrial fibrillation: Secondary | ICD-10-CM | POA: Diagnosis not present

## 2013-06-25 DIAGNOSIS — I4891 Unspecified atrial fibrillation: Secondary | ICD-10-CM | POA: Diagnosis not present

## 2013-07-03 DIAGNOSIS — E039 Hypothyroidism, unspecified: Secondary | ICD-10-CM | POA: Diagnosis not present

## 2013-07-07 DIAGNOSIS — Z7901 Long term (current) use of anticoagulants: Secondary | ICD-10-CM | POA: Diagnosis not present

## 2013-07-07 DIAGNOSIS — I4891 Unspecified atrial fibrillation: Secondary | ICD-10-CM | POA: Diagnosis not present

## 2013-07-14 DIAGNOSIS — I4891 Unspecified atrial fibrillation: Secondary | ICD-10-CM | POA: Diagnosis not present

## 2013-07-16 DIAGNOSIS — Z7901 Long term (current) use of anticoagulants: Secondary | ICD-10-CM | POA: Diagnosis not present

## 2013-07-16 DIAGNOSIS — I4891 Unspecified atrial fibrillation: Secondary | ICD-10-CM | POA: Diagnosis not present

## 2013-08-07 DIAGNOSIS — N419 Inflammatory disease of prostate, unspecified: Secondary | ICD-10-CM | POA: Diagnosis not present

## 2013-08-07 DIAGNOSIS — R351 Nocturia: Secondary | ICD-10-CM | POA: Diagnosis not present

## 2013-08-13 DIAGNOSIS — I4891 Unspecified atrial fibrillation: Secondary | ICD-10-CM | POA: Diagnosis not present

## 2013-08-13 DIAGNOSIS — Z7901 Long term (current) use of anticoagulants: Secondary | ICD-10-CM | POA: Diagnosis not present

## 2013-09-10 DIAGNOSIS — I4891 Unspecified atrial fibrillation: Secondary | ICD-10-CM | POA: Diagnosis not present

## 2013-09-10 DIAGNOSIS — I4892 Unspecified atrial flutter: Secondary | ICD-10-CM | POA: Diagnosis not present

## 2013-09-10 DIAGNOSIS — Z7901 Long term (current) use of anticoagulants: Secondary | ICD-10-CM | POA: Diagnosis not present

## 2013-09-12 DIAGNOSIS — E785 Hyperlipidemia, unspecified: Secondary | ICD-10-CM | POA: Diagnosis not present

## 2013-09-12 DIAGNOSIS — I10 Essential (primary) hypertension: Secondary | ICD-10-CM | POA: Diagnosis not present

## 2013-09-12 DIAGNOSIS — I4891 Unspecified atrial fibrillation: Secondary | ICD-10-CM | POA: Diagnosis not present

## 2013-09-12 DIAGNOSIS — E039 Hypothyroidism, unspecified: Secondary | ICD-10-CM | POA: Diagnosis not present

## 2013-09-12 DIAGNOSIS — I4892 Unspecified atrial flutter: Secondary | ICD-10-CM | POA: Diagnosis not present

## 2013-09-12 DIAGNOSIS — Z8673 Personal history of transient ischemic attack (TIA), and cerebral infarction without residual deficits: Secondary | ICD-10-CM | POA: Diagnosis not present

## 2013-09-18 DIAGNOSIS — I4891 Unspecified atrial fibrillation: Secondary | ICD-10-CM | POA: Diagnosis not present

## 2013-09-24 DIAGNOSIS — I4891 Unspecified atrial fibrillation: Secondary | ICD-10-CM | POA: Diagnosis not present

## 2013-10-08 DIAGNOSIS — I4891 Unspecified atrial fibrillation: Secondary | ICD-10-CM | POA: Diagnosis not present

## 2013-10-08 DIAGNOSIS — Z7901 Long term (current) use of anticoagulants: Secondary | ICD-10-CM | POA: Diagnosis not present

## 2013-10-08 DIAGNOSIS — I959 Hypotension, unspecified: Secondary | ICD-10-CM | POA: Diagnosis not present

## 2013-10-08 DIAGNOSIS — E785 Hyperlipidemia, unspecified: Secondary | ICD-10-CM | POA: Diagnosis not present

## 2013-10-08 DIAGNOSIS — E039 Hypothyroidism, unspecified: Secondary | ICD-10-CM | POA: Diagnosis not present

## 2013-10-08 DIAGNOSIS — I10 Essential (primary) hypertension: Secondary | ICD-10-CM | POA: Diagnosis not present

## 2013-10-10 DIAGNOSIS — L570 Actinic keratosis: Secondary | ICD-10-CM | POA: Diagnosis not present

## 2013-10-10 DIAGNOSIS — L820 Inflamed seborrheic keratosis: Secondary | ICD-10-CM | POA: Diagnosis not present

## 2013-10-10 DIAGNOSIS — L988 Other specified disorders of the skin and subcutaneous tissue: Secondary | ICD-10-CM | POA: Diagnosis not present

## 2013-10-16 DIAGNOSIS — E785 Hyperlipidemia, unspecified: Secondary | ICD-10-CM | POA: Diagnosis not present

## 2013-10-16 DIAGNOSIS — I428 Other cardiomyopathies: Secondary | ICD-10-CM | POA: Diagnosis not present

## 2013-10-18 DIAGNOSIS — I4891 Unspecified atrial fibrillation: Secondary | ICD-10-CM | POA: Diagnosis not present

## 2013-10-18 DIAGNOSIS — R079 Chest pain, unspecified: Secondary | ICD-10-CM | POA: Diagnosis not present

## 2013-10-20 DIAGNOSIS — I4891 Unspecified atrial fibrillation: Secondary | ICD-10-CM | POA: Diagnosis not present

## 2013-10-23 DIAGNOSIS — I4891 Unspecified atrial fibrillation: Secondary | ICD-10-CM | POA: Diagnosis not present

## 2013-10-23 DIAGNOSIS — Z7901 Long term (current) use of anticoagulants: Secondary | ICD-10-CM | POA: Diagnosis not present

## 2013-11-03 DIAGNOSIS — I4891 Unspecified atrial fibrillation: Secondary | ICD-10-CM | POA: Diagnosis not present

## 2013-11-03 DIAGNOSIS — Z7901 Long term (current) use of anticoagulants: Secondary | ICD-10-CM | POA: Diagnosis not present

## 2013-11-06 DIAGNOSIS — H547 Unspecified visual loss: Secondary | ICD-10-CM | POA: Diagnosis not present

## 2013-11-13 DIAGNOSIS — I4892 Unspecified atrial flutter: Secondary | ICD-10-CM | POA: Diagnosis not present

## 2013-11-13 DIAGNOSIS — E785 Hyperlipidemia, unspecified: Secondary | ICD-10-CM | POA: Diagnosis not present

## 2013-11-13 DIAGNOSIS — E039 Hypothyroidism, unspecified: Secondary | ICD-10-CM | POA: Diagnosis not present

## 2013-11-13 DIAGNOSIS — I959 Hypotension, unspecified: Secondary | ICD-10-CM | POA: Diagnosis not present

## 2013-11-13 DIAGNOSIS — I10 Essential (primary) hypertension: Secondary | ICD-10-CM | POA: Diagnosis not present

## 2013-11-13 DIAGNOSIS — I4891 Unspecified atrial fibrillation: Secondary | ICD-10-CM | POA: Diagnosis not present

## 2013-11-25 DIAGNOSIS — Z7901 Long term (current) use of anticoagulants: Secondary | ICD-10-CM | POA: Diagnosis not present

## 2013-11-25 DIAGNOSIS — I4891 Unspecified atrial fibrillation: Secondary | ICD-10-CM | POA: Diagnosis not present

## 2013-11-26 DIAGNOSIS — N419 Inflammatory disease of prostate, unspecified: Secondary | ICD-10-CM | POA: Diagnosis not present

## 2013-11-26 DIAGNOSIS — K5900 Constipation, unspecified: Secondary | ICD-10-CM | POA: Diagnosis not present

## 2013-11-26 DIAGNOSIS — N138 Other obstructive and reflux uropathy: Secondary | ICD-10-CM | POA: Diagnosis not present

## 2013-11-26 DIAGNOSIS — N401 Enlarged prostate with lower urinary tract symptoms: Secondary | ICD-10-CM | POA: Diagnosis not present

## 2013-12-23 DIAGNOSIS — I4891 Unspecified atrial fibrillation: Secondary | ICD-10-CM | POA: Diagnosis not present

## 2013-12-23 DIAGNOSIS — Z7901 Long term (current) use of anticoagulants: Secondary | ICD-10-CM | POA: Diagnosis not present

## 2014-01-01 DIAGNOSIS — I10 Essential (primary) hypertension: Secondary | ICD-10-CM | POA: Diagnosis not present

## 2014-01-01 DIAGNOSIS — R109 Unspecified abdominal pain: Secondary | ICD-10-CM | POA: Diagnosis not present

## 2014-01-01 DIAGNOSIS — Z8673 Personal history of transient ischemic attack (TIA), and cerebral infarction without residual deficits: Secondary | ICD-10-CM | POA: Diagnosis not present

## 2014-01-01 DIAGNOSIS — R1084 Generalized abdominal pain: Secondary | ICD-10-CM | POA: Diagnosis not present

## 2014-01-07 DIAGNOSIS — R1031 Right lower quadrant pain: Secondary | ICD-10-CM | POA: Diagnosis not present

## 2014-01-07 DIAGNOSIS — K5730 Diverticulosis of large intestine without perforation or abscess without bleeding: Secondary | ICD-10-CM | POA: Diagnosis not present

## 2014-01-07 DIAGNOSIS — N400 Enlarged prostate without lower urinary tract symptoms: Secondary | ICD-10-CM | POA: Diagnosis not present

## 2014-01-08 DIAGNOSIS — R1031 Right lower quadrant pain: Secondary | ICD-10-CM | POA: Diagnosis not present

## 2014-01-08 DIAGNOSIS — R109 Unspecified abdominal pain: Secondary | ICD-10-CM | POA: Diagnosis not present

## 2014-01-15 DIAGNOSIS — R1031 Right lower quadrant pain: Secondary | ICD-10-CM | POA: Diagnosis not present

## 2014-01-15 DIAGNOSIS — R109 Unspecified abdominal pain: Secondary | ICD-10-CM | POA: Diagnosis not present

## 2014-01-20 DIAGNOSIS — I4891 Unspecified atrial fibrillation: Secondary | ICD-10-CM | POA: Diagnosis not present

## 2014-01-20 DIAGNOSIS — Z7901 Long term (current) use of anticoagulants: Secondary | ICD-10-CM | POA: Diagnosis not present

## 2014-02-19 DIAGNOSIS — I4891 Unspecified atrial fibrillation: Secondary | ICD-10-CM | POA: Diagnosis not present

## 2014-02-19 DIAGNOSIS — R109 Unspecified abdominal pain: Secondary | ICD-10-CM | POA: Diagnosis not present

## 2014-02-19 DIAGNOSIS — R1031 Right lower quadrant pain: Secondary | ICD-10-CM | POA: Diagnosis not present

## 2014-02-19 DIAGNOSIS — Z7901 Long term (current) use of anticoagulants: Secondary | ICD-10-CM | POA: Diagnosis not present

## 2014-02-26 DIAGNOSIS — N401 Enlarged prostate with lower urinary tract symptoms: Secondary | ICD-10-CM | POA: Diagnosis not present

## 2014-02-26 DIAGNOSIS — N138 Other obstructive and reflux uropathy: Secondary | ICD-10-CM | POA: Diagnosis not present

## 2014-02-26 DIAGNOSIS — N419 Inflammatory disease of prostate, unspecified: Secondary | ICD-10-CM | POA: Diagnosis not present

## 2014-02-26 DIAGNOSIS — R351 Nocturia: Secondary | ICD-10-CM | POA: Diagnosis not present

## 2014-03-05 DIAGNOSIS — Z125 Encounter for screening for malignant neoplasm of prostate: Secondary | ICD-10-CM | POA: Diagnosis not present

## 2014-03-19 DIAGNOSIS — I4891 Unspecified atrial fibrillation: Secondary | ICD-10-CM | POA: Diagnosis not present

## 2014-03-19 DIAGNOSIS — Z7901 Long term (current) use of anticoagulants: Secondary | ICD-10-CM | POA: Diagnosis not present

## 2014-03-26 DIAGNOSIS — I4891 Unspecified atrial fibrillation: Secondary | ICD-10-CM | POA: Diagnosis not present

## 2014-03-28 DIAGNOSIS — R5381 Other malaise: Secondary | ICD-10-CM | POA: Diagnosis not present

## 2014-03-28 DIAGNOSIS — R5383 Other fatigue: Secondary | ICD-10-CM | POA: Diagnosis not present

## 2014-03-28 DIAGNOSIS — M79609 Pain in unspecified limb: Secondary | ICD-10-CM | POA: Diagnosis not present

## 2014-03-30 DIAGNOSIS — E785 Hyperlipidemia, unspecified: Secondary | ICD-10-CM | POA: Diagnosis not present

## 2014-03-30 DIAGNOSIS — M25519 Pain in unspecified shoulder: Secondary | ICD-10-CM | POA: Diagnosis not present

## 2014-03-30 DIAGNOSIS — Z7901 Long term (current) use of anticoagulants: Secondary | ICD-10-CM | POA: Diagnosis not present

## 2014-03-30 DIAGNOSIS — I10 Essential (primary) hypertension: Secondary | ICD-10-CM | POA: Diagnosis not present

## 2014-03-30 DIAGNOSIS — I4891 Unspecified atrial fibrillation: Secondary | ICD-10-CM | POA: Diagnosis not present

## 2014-04-01 DIAGNOSIS — I10 Essential (primary) hypertension: Secondary | ICD-10-CM | POA: Diagnosis not present

## 2014-04-04 DIAGNOSIS — R0789 Other chest pain: Secondary | ICD-10-CM | POA: Diagnosis not present

## 2014-04-20 DIAGNOSIS — I4892 Unspecified atrial flutter: Secondary | ICD-10-CM | POA: Diagnosis not present

## 2014-04-20 DIAGNOSIS — I428 Other cardiomyopathies: Secondary | ICD-10-CM | POA: Diagnosis not present

## 2014-04-20 DIAGNOSIS — Z7901 Long term (current) use of anticoagulants: Secondary | ICD-10-CM | POA: Diagnosis not present

## 2014-04-20 DIAGNOSIS — I4891 Unspecified atrial fibrillation: Secondary | ICD-10-CM | POA: Diagnosis not present

## 2014-05-18 DIAGNOSIS — Z7901 Long term (current) use of anticoagulants: Secondary | ICD-10-CM | POA: Diagnosis not present

## 2014-05-18 DIAGNOSIS — I4891 Unspecified atrial fibrillation: Secondary | ICD-10-CM | POA: Diagnosis not present

## 2014-05-19 ENCOUNTER — Ambulatory Visit: Payer: Medicare Other | Admitting: Family

## 2014-05-25 ENCOUNTER — Encounter: Payer: Self-pay | Admitting: Family

## 2014-05-26 ENCOUNTER — Ambulatory Visit (INDEPENDENT_AMBULATORY_CARE_PROVIDER_SITE_OTHER): Payer: Medicare Other | Admitting: Family

## 2014-05-26 ENCOUNTER — Encounter: Payer: Self-pay | Admitting: Family

## 2014-05-26 VITALS — BP 132/80 | HR 48 | Resp 14 | Ht 72.0 in | Wt 207.0 lb

## 2014-05-26 DIAGNOSIS — I83893 Varicose veins of bilateral lower extremities with other complications: Secondary | ICD-10-CM

## 2014-05-26 NOTE — Progress Notes (Addendum)
Established Varicose Veins, S/P EVAR  History of Present Illness  Allen [name] is a 73 y.o. (11-16-1940) male patient of Dr. Donnetta Hutching who is s/p stab Phlebectomy: 10-20 incisions Sites: Thigh and Calf left leg on 03/13/13. He is also s/p EVAR several years ago. Pt does not know why he is here other than he was advised that he needs to be seen. After some investigation it appears that it has been over 2 years since he has been evaluated re his EVAR and needs a medical evaluation before his health insurance will cover the needed CTA of abdomen/pelvis. The patient denies abdominal or back pain, denies claudication symptoms with walking, denies non healing wounds. He does not have diabetes, he quit smoking in 1995. He takes coumadin, noted history of atrial fibrillation. He takes a daily statin.   Past Medical History  Diagnosis Date  . Hypertension   . CVA (cerebral infarction) 1969  . Irregular heart beat   . AAA (abdominal aortic aneurysm)   . CHF (congestive heart failure)   . Prostatitis   . CAD (coronary artery disease)   . Thyroid disease     Hypothyrodidism  . Atrial fibrillation   . Varicose veins     Social History History  Substance Use Topics  . Smoking status: Former Smoker    Types: Cigarettes    Quit date: 09/04/1993  . Smokeless tobacco: Never Used  . Alcohol Use: 0.0 oz/week    2-4 Glasses of wine per week    Family History Family History  Problem Relation Age of Onset  . Stroke Father   . Angina Mother   . Hypertension Sister   . Heart disease Sister     Surgical History Past Surgical History  Procedure Laterality Date  . Carotid endarterectomy  November 21, 2006    Left cea  . Inguinal hernia repair      X's  4  . Tonsillectomy and adenoidectomy    . Ablation    . Abdominal aortic aneurysm repair    . Inguinal hernia repair  09-2011    left side  . Endovenous ablation saphenous vein w/ laser Left 11-28-2012    left greater saphenous vein by  Curt Jews MD  . Endovenous ablation saphenous vein w/ laser Right 12-26-2012    right gretaer saphenous vein by Curt Jews MD  . Stab phlebectomy Left 03-13-2013    10-20 incisions by Curt Jews MD    No Known Allergies  Current Outpatient Prescriptions  Medication Sig Dispense Refill  . acyclovir (ZOVIRAX) 800 MG tablet Take 400 mg by mouth 2 (two) times daily.      . Levothyroxine Sodium (SYNTHROID PO) Take 175 mcg by mouth daily.       . metoprolol tartrate (LOPRESSOR) 25 MG tablet Take 25 mg by mouth 2 (two) times daily.      . propafenone (RYTHMOL) 150 MG tablet Take 150 mg by mouth 2 (two) times daily.      . sildenafil (VIAGRA) 25 MG tablet Take 25 mg by mouth as needed.      . silodosin (RAPAFLO) 8 MG CAPS capsule Take 8 mg by mouth daily with breakfast. Pt. States he takes 1 tablet daily for 3 days after intercourse.      . simvastatin (ZOCOR) 10 MG tablet Take 10 mg by mouth at bedtime.      Marland Kitchen warfarin (COUMADIN) 4 MG tablet Take 4 mg by mouth daily.      Marland Kitchen  amoxicillin-clavulanate (AUGMENTIN) 500-125 MG per tablet Take 1 tablet by mouth 2 (two) times daily.      Marland Kitchen aspirin 81 MG tablet Take 81 mg by mouth daily.      . diclofenac (VOLTAREN) 75 MG EC tablet Take 75 mg by mouth 2 (two) times daily. Takes 1 tablet 1 day after intercourse.      Marland Kitchen FINASTERIDE PO Take 5 mg by mouth daily.      Marland Kitchen lisinopril (PRINIVIL,ZESTRIL) 10 MG tablet Take 20 mg by mouth daily.       . psyllium (METAMUCIL) 58.6 % powder Take 1 packet by mouth 3 (three) times daily.      . Sulfamethoxazole-Trimethoprim (BACTRIM PO) Take by mouth daily.      . Tamsulosin HCl (FLOMAX) 0.4 MG CAPS Take by mouth.      . valACYclovir (VALTREX) 500 MG tablet Take 400 mg by mouth 2 (two) times daily. Takes 1/2 tablet twice daily.       No current facility-administered medications for this visit.    On ROS today: See HPI for pertinent positives and negatives.  Physical Examination  Filed Vitals:   05/26/14 1135    BP: 132/80  Pulse: 48  Resp: 14  Height: 6' (1.829 m)  Weight: 207 lb (93.895 kg)  SpO2: 99%   Body mass index is 28.07 kg/(m^2).  General: A&O x 3, WD.  Pulmonary: Sym exp, good air movt, CTAB, no rales, rhonchi, or wheezing.  Cardiac: RRR, Nl S1, S2, no detected murmur.  Vascular: Vessel Right Left  Radial 2+Palpable 2+Palpable  Carotid palpable without bruit Palpable without bruit  Aorta Not palpable N/A  Femoral 2+Palpable 2+Palpable  Popliteal Not palpable Not palpable  PT 2+Palpable 2+Palpable  DP 2+Palpable 2+Palpable   Gastrointestinal: soft, NTND, -G/R, - HSM, - masses, - CVAT B.  Musculoskeletal: M/S 5/5 throughout, Extremities without ischemic changes.  Neurologic: Pain and light touch intact in extremities, Motor exam as listed above.   Medical Decision Making  Allen [name] is a 73 y.o. male who presents s/p EVAR several years ago and needs routine surveillance of his EVAR site by CTA that is already scheduled for 09/01/14. After some effort, CTA of abdomen/pelvis performed on 01/07/14 at [facility] result was obtained which revealed stable post op changes are seen from stent graft repair of abdominal aortic aneurysm. The native aneurysm show further decrease in size measuring 3.7 cm compared to 4.0 cm previously on 08/20/12. Post op changes in the suprapubic anterior abdominal wall are also stable. This result was reviewed by and discussed with Dr. Donnetta Hutching. Patient will be notified that his stent graft repair of the AAA has decreased in size, is stable, and he will not need to return until May, 2017 at which time he is advised to schedule a Duplex of the stent graft and an appointment with me this same day following the Duplex to discuss the results and allow me to evaluate him.   Clemon Chambers, RN, MSN, FNP-C [facility] [place] Office: [phone]  Clinic MD: Early  05/26/2014, 11:52 AM

## 2014-05-29 ENCOUNTER — Telehealth: Payer: Self-pay | Admitting: Vascular Surgery

## 2014-05-29 ENCOUNTER — Encounter: Payer: Self-pay | Admitting: *Deleted

## 2014-05-29 ENCOUNTER — Other Ambulatory Visit: Payer: Self-pay | Admitting: *Deleted

## 2014-05-29 DIAGNOSIS — I714 Abdominal aortic aneurysm, without rupture, unspecified: Secondary | ICD-10-CM

## 2014-05-29 DIAGNOSIS — N419 Inflammatory disease of prostate, unspecified: Secondary | ICD-10-CM | POA: Diagnosis not present

## 2014-05-29 DIAGNOSIS — N401 Enlarged prostate with lower urinary tract symptoms: Secondary | ICD-10-CM | POA: Diagnosis not present

## 2014-05-29 DIAGNOSIS — Z48812 Encounter for surgical aftercare following surgery on the circulatory system: Secondary | ICD-10-CM

## 2014-05-29 NOTE — Telephone Encounter (Signed)
I cancelled the CT and f/u visit for 08/2014 and scheduled his EVAR duplex and f/u exam for 01/04/2016.  A letter was mailed to Allen [name] today.

## 2014-05-29 NOTE — Telephone Encounter (Signed)
Message copied by Rufina Falco on Fri May 29, 2014 10:45 AM ------      Message from: Viann Fish      Created: Thu May 28, 2014  7:38 PM      Regarding: cancel CTA abdomen/pelvis for for 09/01/14, etc       Zigmund Daniel,       Please call pt and let him know that Dr. Donnetta Hutching reviewed the CT result of his abdomen/pelvis done May of this year, and that the aortic stent graft site is stable, the aneurysm sac has decreased in size which is a good thing.      He will not need to have another CT in December, this needs to be canceled, and he does not need to return to see me or Dr. Donnetta Hutching in December.      He should follow up in May, 2017, with a stent graft Duplex of his abdomen here in our office non invasive vascular lab, and see me afterward to discuss the result and allow me to evaluate him.            Rip Harbour,      Please schedule his Duplex as above,            Thank you,      Vinnie Level                   ------

## 2014-06-12 DIAGNOSIS — Z7901 Long term (current) use of anticoagulants: Secondary | ICD-10-CM | POA: Diagnosis not present

## 2014-06-12 DIAGNOSIS — I4891 Unspecified atrial fibrillation: Secondary | ICD-10-CM | POA: Diagnosis not present

## 2014-06-22 DIAGNOSIS — Z23 Encounter for immunization: Secondary | ICD-10-CM | POA: Diagnosis not present

## 2014-06-23 DIAGNOSIS — I10 Essential (primary) hypertension: Secondary | ICD-10-CM | POA: Diagnosis not present

## 2014-06-23 DIAGNOSIS — Z8673 Personal history of transient ischemic attack (TIA), and cerebral infarction without residual deficits: Secondary | ICD-10-CM | POA: Diagnosis not present

## 2014-06-23 DIAGNOSIS — I4891 Unspecified atrial fibrillation: Secondary | ICD-10-CM | POA: Diagnosis not present

## 2014-06-23 DIAGNOSIS — I4892 Unspecified atrial flutter: Secondary | ICD-10-CM | POA: Diagnosis not present

## 2014-06-23 IMAGING — CT CT CTA ABD/PEL W/CM AND/OR W/O CM
2 of 9 series · 13 of 46 positions shown, 17 images · IV contrast (80CC OMNI 350)
Comparison: CT abdomen pelvis - 08/22/2011;

CLINICAL DATA: Post stent graft repair of AAA 7 years ago

CT ANGIOGRAPHY ABDOMEN AND PELVIS WITH CONTRAST AND WITHOUT
CONTRAST

[Series 4: angio · axial · 0.81mm/px · z∈[-455,-28]mm · 11 of 259 slices shown, 15 images]
[im 17/259  soft-tissue]
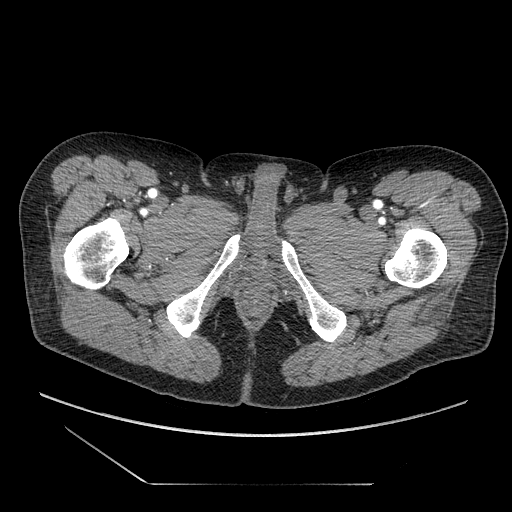
[im 17/259  bone]
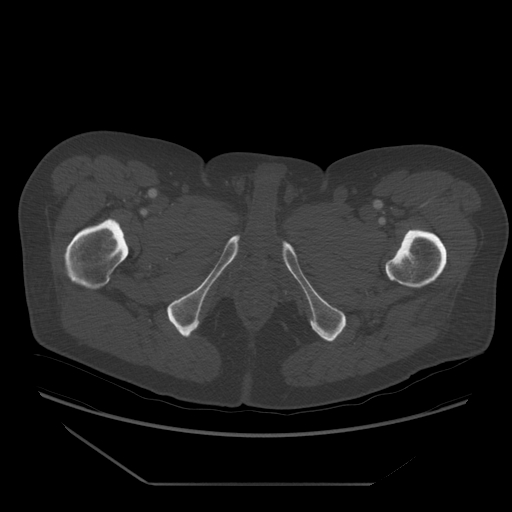
[im 49/259  soft-tissue]
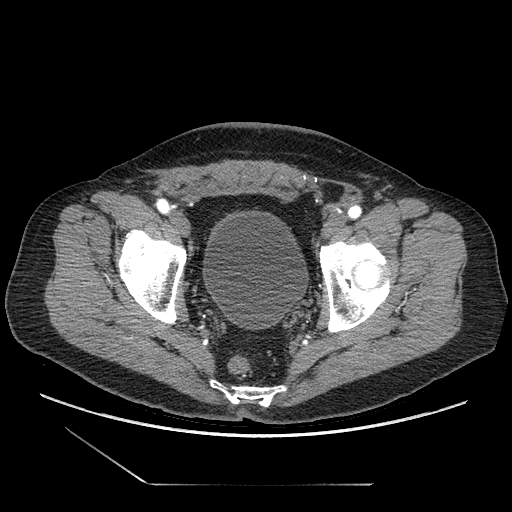
[im 81/259  soft-tissue]
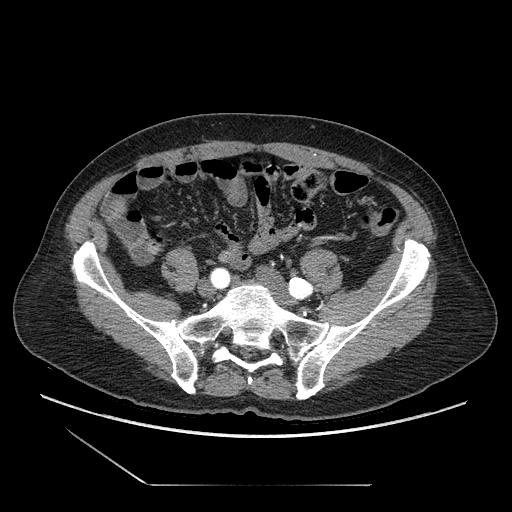
[im 97/259  soft-tissue]
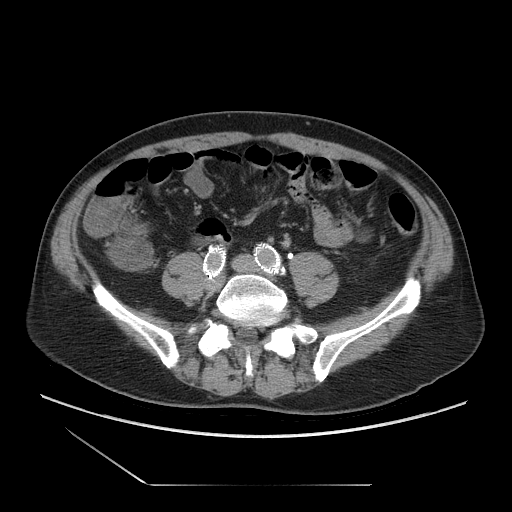
[im 130/259  soft-tissue]
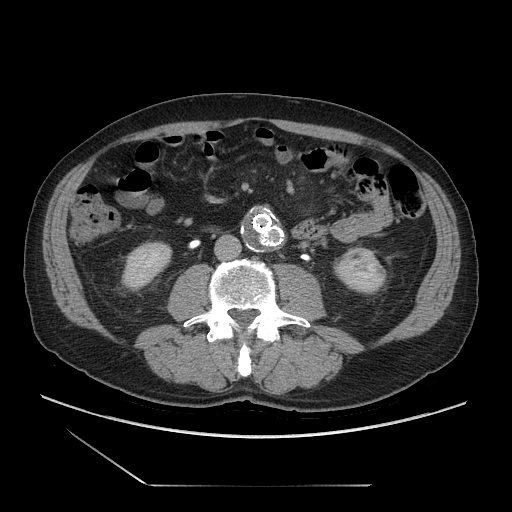
[im 162/259  soft-tissue]
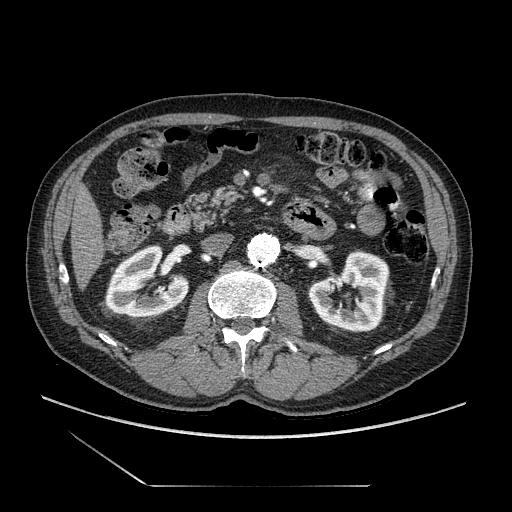
[im 178/259  soft-tissue]
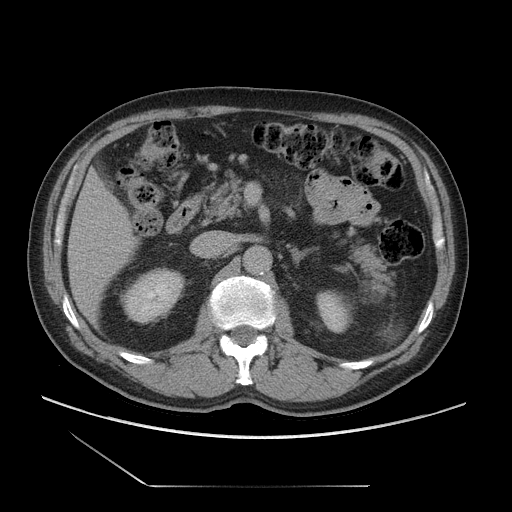
[im 194/259  lung]
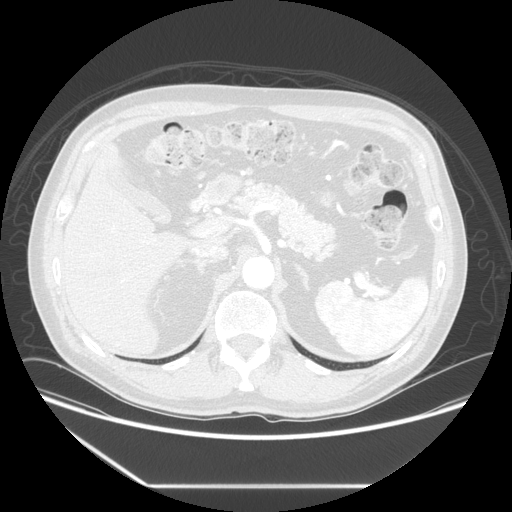
[im 210/259  soft-tissue]
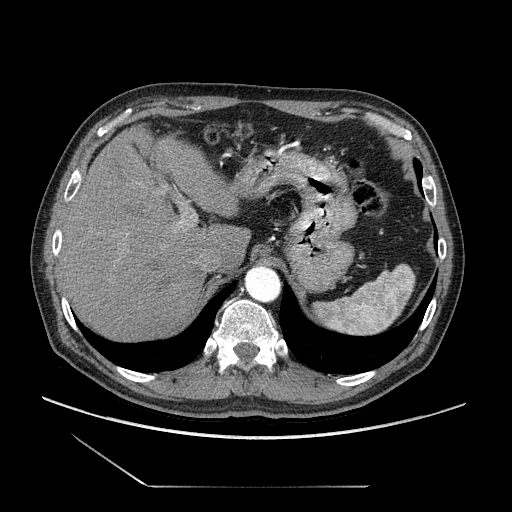
[im 210/259  lung]
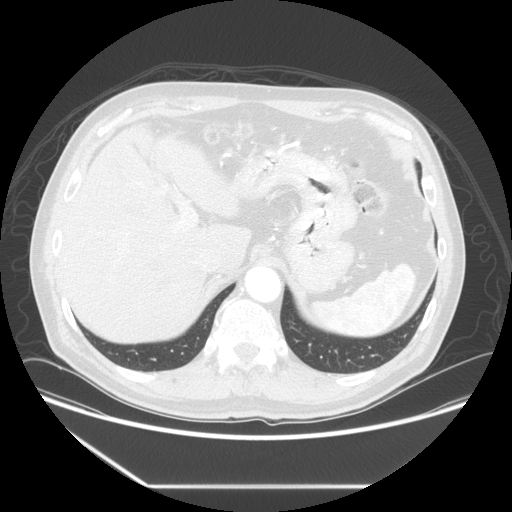
[im 226/259  lung]
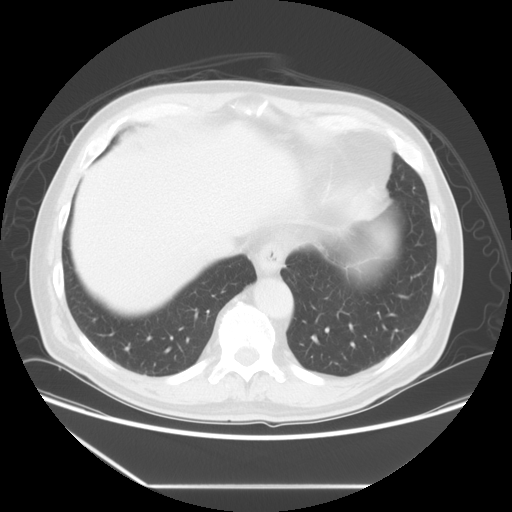
[im 242/259  soft-tissue]
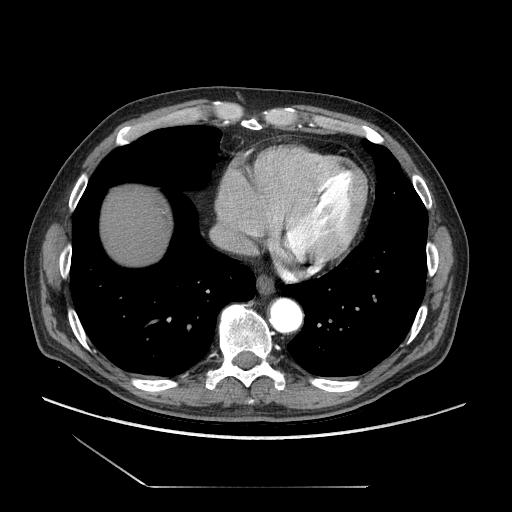
[im 242/259  lung]
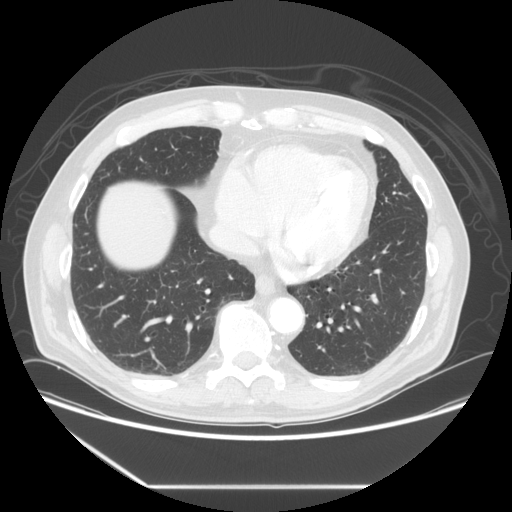
[im 242/259  bone]
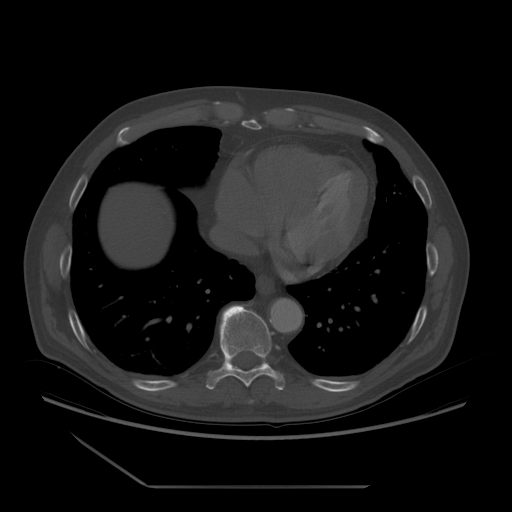

[Series 303: cor · coronal · 0.92mm/px · 2 of 115 slices shown]
[im 39/115  soft-tissue]
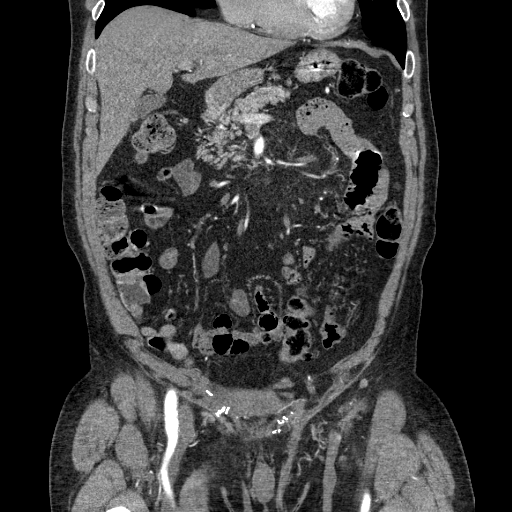
[im 77/115  soft-tissue]
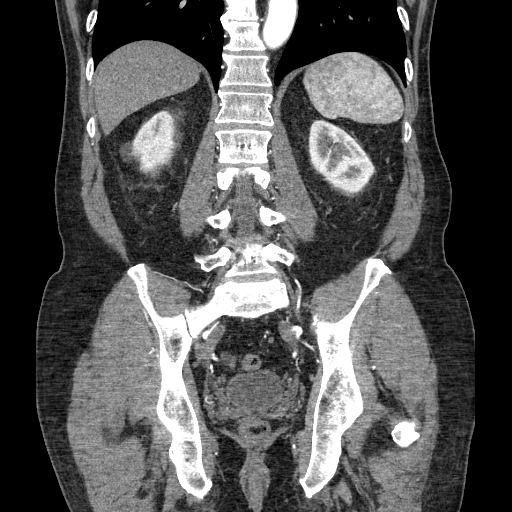

[13 of 46 positions shown; findings below may reference images not displayed]

06/26/2008

Vascular Findings:

Abdominal aorta: Stable sequela of endovascular stent graft repair
of infrarenal abdominal aortic aneurysm.  The stent graft is widely
patent.  The cranial and caudal ends of the stent graft are well
apposed against the respective walls of the infrarenal abdominal
aorta and bilateral common iliac arteries.  The excluded caliber of
the caliber of the excluded infrarenal abdominal aortic aneurysm
sac has continued to decrease in size, currently measuring
approximately 3.38 x 4.0 cm in greatest oblique axial dimension
(image 85, series four) previously, 4.9 x 5.3 cm.  There is no
evidence of endoleak.  No periaortic stranding.

Celiac artery:  Widely patent; conventional branching pattern.

SMA:  Widely patent; classical branching pattern.

Right renal artery:  Solitary; widely patent.

Solitary; widely patent.

IMA:  Occluded at its origin with early reconstitution via
hypertrophied collateral supply from the marginal artery Terence.

Pelvic vasculature:  Widely patent without hemodynamically
significant narrowing.

-------------------------------------------------------

Nonvascular findings:

Normal hepatic contour.  No discrete hepatic lesions.  The
gallbladder is decompressed but otherwise normal.  No definite
intra or extrahepatic biliary ductal dilatation.  No ascites.

There is symmetric enhancement and excretion of the bilateral
kidneys.  No renal stones.  Sub centimeter hypoattenuating lesions
within the inferior pole left kidney (coronal images 64 and 66,
series 303) are too small to accurately characterize.  Grossly
unchanged grossly symmetric bilateral likely a dilated perinephric
stranding.  No urinary obstruction.  Normal appearance of the
bilateral adrenal glands, pancreas and spleen.

There is extensive colonic diverticulosis without evidence of
diverticulitis.  The bowel is otherwise normal in appearance of
course and caliber without evidence of wall thickening or
obstruction.  Normal appearance of appendix.  No pneumoperitoneum,
pneumatosis or portal venous gas. There is unchanged mild haziness
of the abdominal mesentery with adenopathy.  No retroperitoneal,
mesenteric, pelvic or inguinal lymphadenopathy.

The prostate is enlarged of mass effect upon the undersurface of
the bladder.  No free fluid in the pelvis.

Limited visualization of the lower thorax demonstrates minimal
bibasilar subpleural atelectasis, right greater than left.  No
focal airspace opacities.  No pleural effusion.

Normal heart size.  Coronary artery calcifications is suspected.
No pericardial effusion.

No acute or aggressive osseous abnormalities.  Facet degenerative
change, worst at L4 - L5 and L5 - S1 bilaterally. Bone island
within the right pubic bone. Post bilateral inguinal hernia repair.
IMPRESSION: 1.  Stable stent graft repair of infrarenal abdominal aortic
aneurysm without evidence of endoleak.  Continued decrease in size
of excluded native abdominal aortic aneurysm sac, currently
measuring 3.3 x 4.0 cm, previously, 4.9 x 5.3 cm.
2.   Enlarged prostate with mass effect upon the undersurface of
the bladder.
3.  Extensive colonic diverticulosis without evidence of
diverticulitis.

## 2014-07-13 DIAGNOSIS — Z7901 Long term (current) use of anticoagulants: Secondary | ICD-10-CM | POA: Diagnosis not present

## 2014-07-13 DIAGNOSIS — I4891 Unspecified atrial fibrillation: Secondary | ICD-10-CM | POA: Diagnosis not present

## 2014-07-16 ENCOUNTER — Encounter: Payer: Self-pay | Admitting: Cardiology

## 2014-08-10 DIAGNOSIS — Z7901 Long term (current) use of anticoagulants: Secondary | ICD-10-CM | POA: Diagnosis not present

## 2014-08-14 ENCOUNTER — Encounter: Payer: Self-pay | Admitting: Cardiology

## 2014-08-25 ENCOUNTER — Ambulatory Visit: Payer: Medicare Other | Admitting: Vascular Surgery

## 2014-08-25 ENCOUNTER — Other Ambulatory Visit: Payer: Medicare Other

## 2014-09-01 ENCOUNTER — Other Ambulatory Visit: Payer: Medicare Other

## 2014-09-01 ENCOUNTER — Ambulatory Visit: Payer: Medicare Other | Admitting: Vascular Surgery

## 2014-09-08 DIAGNOSIS — Z0000 Encounter for general adult medical examination without abnormal findings: Secondary | ICD-10-CM | POA: Diagnosis not present

## 2014-09-08 DIAGNOSIS — L918 Other hypertrophic disorders of the skin: Secondary | ICD-10-CM | POA: Diagnosis not present

## 2014-09-08 DIAGNOSIS — D210 Benign neoplasm of connective and other soft tissue of head, face and neck: Secondary | ICD-10-CM | POA: Diagnosis not present

## 2014-09-08 DIAGNOSIS — L570 Actinic keratosis: Secondary | ICD-10-CM | POA: Diagnosis not present

## 2014-09-08 DIAGNOSIS — E039 Hypothyroidism, unspecified: Secondary | ICD-10-CM | POA: Diagnosis not present

## 2014-09-08 DIAGNOSIS — Z7901 Long term (current) use of anticoagulants: Secondary | ICD-10-CM | POA: Diagnosis not present

## 2014-09-09 DIAGNOSIS — N401 Enlarged prostate with lower urinary tract symptoms: Secondary | ICD-10-CM | POA: Diagnosis not present

## 2014-09-09 DIAGNOSIS — N419 Inflammatory disease of prostate, unspecified: Secondary | ICD-10-CM | POA: Diagnosis not present

## 2014-09-09 DIAGNOSIS — K5900 Constipation, unspecified: Secondary | ICD-10-CM | POA: Diagnosis not present

## 2014-09-21 DIAGNOSIS — Z23 Encounter for immunization: Secondary | ICD-10-CM | POA: Diagnosis not present

## 2014-10-06 DIAGNOSIS — Z7901 Long term (current) use of anticoagulants: Secondary | ICD-10-CM | POA: Diagnosis not present

## 2014-10-09 ENCOUNTER — Encounter: Payer: Self-pay | Admitting: Cardiology

## 2014-10-21 DIAGNOSIS — I7102 Dissection of abdominal aorta: Secondary | ICD-10-CM | POA: Diagnosis not present

## 2014-10-22 DIAGNOSIS — E784 Other hyperlipidemia: Secondary | ICD-10-CM | POA: Diagnosis not present

## 2014-10-22 DIAGNOSIS — I480 Paroxysmal atrial fibrillation: Secondary | ICD-10-CM | POA: Diagnosis not present

## 2014-10-27 DIAGNOSIS — N419 Inflammatory disease of prostate, unspecified: Secondary | ICD-10-CM | POA: Diagnosis not present

## 2014-10-27 DIAGNOSIS — N401 Enlarged prostate with lower urinary tract symptoms: Secondary | ICD-10-CM | POA: Diagnosis not present

## 2014-10-27 DIAGNOSIS — K5900 Constipation, unspecified: Secondary | ICD-10-CM | POA: Diagnosis not present

## 2014-11-04 DIAGNOSIS — Z7901 Long term (current) use of anticoagulants: Secondary | ICD-10-CM | POA: Diagnosis not present

## 2014-11-12 DIAGNOSIS — Z0000 Encounter for general adult medical examination without abnormal findings: Secondary | ICD-10-CM | POA: Diagnosis not present

## 2014-11-12 DIAGNOSIS — Z79899 Other long term (current) drug therapy: Secondary | ICD-10-CM | POA: Diagnosis not present

## 2014-11-12 DIAGNOSIS — E039 Hypothyroidism, unspecified: Secondary | ICD-10-CM | POA: Diagnosis not present

## 2014-12-02 DIAGNOSIS — Z7901 Long term (current) use of anticoagulants: Secondary | ICD-10-CM | POA: Diagnosis not present

## 2014-12-11 DIAGNOSIS — K5900 Constipation, unspecified: Secondary | ICD-10-CM | POA: Diagnosis not present

## 2014-12-11 DIAGNOSIS — G9611 Dural tear: Secondary | ICD-10-CM | POA: Diagnosis not present

## 2014-12-11 DIAGNOSIS — N401 Enlarged prostate with lower urinary tract symptoms: Secondary | ICD-10-CM | POA: Diagnosis not present

## 2014-12-11 DIAGNOSIS — N419 Inflammatory disease of prostate, unspecified: Secondary | ICD-10-CM | POA: Diagnosis not present

## 2015-01-01 DIAGNOSIS — I7102 Dissection of abdominal aorta: Secondary | ICD-10-CM | POA: Diagnosis not present

## 2015-01-01 DIAGNOSIS — Z7901 Long term (current) use of anticoagulants: Secondary | ICD-10-CM | POA: Diagnosis not present

## 2015-01-18 DIAGNOSIS — N419 Inflammatory disease of prostate, unspecified: Secondary | ICD-10-CM | POA: Diagnosis not present

## 2015-01-18 DIAGNOSIS — N401 Enlarged prostate with lower urinary tract symptoms: Secondary | ICD-10-CM | POA: Diagnosis not present

## 2015-01-29 DIAGNOSIS — I482 Chronic atrial fibrillation: Secondary | ICD-10-CM | POA: Diagnosis not present

## 2015-02-12 DIAGNOSIS — I482 Chronic atrial fibrillation: Secondary | ICD-10-CM | POA: Diagnosis not present

## 2015-02-12 DIAGNOSIS — Z7901 Long term (current) use of anticoagulants: Secondary | ICD-10-CM | POA: Diagnosis not present

## 2015-02-26 DIAGNOSIS — N419 Inflammatory disease of prostate, unspecified: Secondary | ICD-10-CM | POA: Diagnosis not present

## 2015-02-26 DIAGNOSIS — R3911 Hesitancy of micturition: Secondary | ICD-10-CM | POA: Diagnosis not present

## 2015-02-26 DIAGNOSIS — N401 Enlarged prostate with lower urinary tract symptoms: Secondary | ICD-10-CM | POA: Diagnosis not present

## 2015-03-05 DIAGNOSIS — H2513 Age-related nuclear cataract, bilateral: Secondary | ICD-10-CM | POA: Diagnosis not present

## 2015-03-09 DIAGNOSIS — R972 Elevated prostate specific antigen [PSA]: Secondary | ICD-10-CM | POA: Diagnosis not present

## 2015-03-09 DIAGNOSIS — Z125 Encounter for screening for malignant neoplasm of prostate: Secondary | ICD-10-CM | POA: Diagnosis not present

## 2015-03-09 DIAGNOSIS — N401 Enlarged prostate with lower urinary tract symptoms: Secondary | ICD-10-CM | POA: Diagnosis not present

## 2015-03-12 DIAGNOSIS — I482 Chronic atrial fibrillation: Secondary | ICD-10-CM | POA: Diagnosis not present

## 2015-03-12 DIAGNOSIS — Z7901 Long term (current) use of anticoagulants: Secondary | ICD-10-CM | POA: Diagnosis not present

## 2015-04-09 DIAGNOSIS — I482 Chronic atrial fibrillation: Secondary | ICD-10-CM | POA: Diagnosis not present

## 2015-05-06 DIAGNOSIS — I482 Chronic atrial fibrillation: Secondary | ICD-10-CM | POA: Diagnosis not present

## 2015-05-06 DIAGNOSIS — E785 Hyperlipidemia, unspecified: Secondary | ICD-10-CM | POA: Diagnosis not present

## 2015-05-06 DIAGNOSIS — I480 Paroxysmal atrial fibrillation: Secondary | ICD-10-CM | POA: Diagnosis not present

## 2015-05-06 DIAGNOSIS — Z7901 Long term (current) use of anticoagulants: Secondary | ICD-10-CM | POA: Diagnosis not present

## 2015-05-06 DIAGNOSIS — I10 Essential (primary) hypertension: Secondary | ICD-10-CM | POA: Diagnosis not present

## 2015-05-07 DIAGNOSIS — E039 Hypothyroidism, unspecified: Secondary | ICD-10-CM | POA: Diagnosis not present

## 2015-05-07 DIAGNOSIS — E785 Hyperlipidemia, unspecified: Secondary | ICD-10-CM | POA: Diagnosis not present

## 2015-05-14 DIAGNOSIS — N401 Enlarged prostate with lower urinary tract symptoms: Secondary | ICD-10-CM | POA: Diagnosis not present

## 2015-05-14 DIAGNOSIS — N419 Inflammatory disease of prostate, unspecified: Secondary | ICD-10-CM | POA: Diagnosis not present

## 2015-05-14 DIAGNOSIS — R351 Nocturia: Secondary | ICD-10-CM | POA: Diagnosis not present

## 2015-05-20 DIAGNOSIS — Z23 Encounter for immunization: Secondary | ICD-10-CM | POA: Diagnosis not present

## 2015-05-20 DIAGNOSIS — I480 Paroxysmal atrial fibrillation: Secondary | ICD-10-CM | POA: Diagnosis not present

## 2015-05-20 DIAGNOSIS — E039 Hypothyroidism, unspecified: Secondary | ICD-10-CM | POA: Diagnosis not present

## 2015-05-20 DIAGNOSIS — L820 Inflamed seborrheic keratosis: Secondary | ICD-10-CM | POA: Diagnosis not present

## 2015-06-07 DIAGNOSIS — Z7901 Long term (current) use of anticoagulants: Secondary | ICD-10-CM | POA: Diagnosis not present

## 2015-06-07 DIAGNOSIS — I480 Paroxysmal atrial fibrillation: Secondary | ICD-10-CM | POA: Diagnosis not present

## 2015-07-07 DIAGNOSIS — I480 Paroxysmal atrial fibrillation: Secondary | ICD-10-CM | POA: Diagnosis not present

## 2015-07-20 DIAGNOSIS — E039 Hypothyroidism, unspecified: Secondary | ICD-10-CM | POA: Diagnosis not present

## 2015-08-04 DIAGNOSIS — Z7901 Long term (current) use of anticoagulants: Secondary | ICD-10-CM | POA: Diagnosis not present

## 2015-08-04 DIAGNOSIS — I480 Paroxysmal atrial fibrillation: Secondary | ICD-10-CM | POA: Diagnosis not present

## 2015-08-13 DIAGNOSIS — N401 Enlarged prostate with lower urinary tract symptoms: Secondary | ICD-10-CM | POA: Diagnosis not present

## 2015-08-13 DIAGNOSIS — N419 Inflammatory disease of prostate, unspecified: Secondary | ICD-10-CM | POA: Diagnosis not present

## 2015-08-18 DIAGNOSIS — I480 Paroxysmal atrial fibrillation: Secondary | ICD-10-CM | POA: Diagnosis not present

## 2015-08-18 DIAGNOSIS — Z7901 Long term (current) use of anticoagulants: Secondary | ICD-10-CM | POA: Diagnosis not present

## 2015-09-02 DIAGNOSIS — Z7901 Long term (current) use of anticoagulants: Secondary | ICD-10-CM | POA: Diagnosis not present

## 2015-09-02 DIAGNOSIS — I480 Paroxysmal atrial fibrillation: Secondary | ICD-10-CM | POA: Diagnosis not present

## 2015-09-09 DIAGNOSIS — Z7901 Long term (current) use of anticoagulants: Secondary | ICD-10-CM | POA: Diagnosis not present

## 2015-09-09 DIAGNOSIS — I480 Paroxysmal atrial fibrillation: Secondary | ICD-10-CM | POA: Diagnosis not present

## 2015-09-23 DIAGNOSIS — Z7901 Long term (current) use of anticoagulants: Secondary | ICD-10-CM | POA: Diagnosis not present

## 2015-09-23 DIAGNOSIS — I480 Paroxysmal atrial fibrillation: Secondary | ICD-10-CM | POA: Diagnosis not present

## 2015-10-06 DIAGNOSIS — Z7901 Long term (current) use of anticoagulants: Secondary | ICD-10-CM | POA: Diagnosis not present

## 2015-10-06 DIAGNOSIS — Z79899 Other long term (current) drug therapy: Secondary | ICD-10-CM | POA: Diagnosis not present

## 2015-10-06 DIAGNOSIS — E785 Hyperlipidemia, unspecified: Secondary | ICD-10-CM | POA: Diagnosis not present

## 2015-10-06 DIAGNOSIS — I480 Paroxysmal atrial fibrillation: Secondary | ICD-10-CM | POA: Diagnosis not present

## 2015-10-06 DIAGNOSIS — I481 Persistent atrial fibrillation: Secondary | ICD-10-CM | POA: Diagnosis not present

## 2015-11-03 DIAGNOSIS — I480 Paroxysmal atrial fibrillation: Secondary | ICD-10-CM | POA: Diagnosis not present

## 2015-11-03 DIAGNOSIS — E785 Hyperlipidemia, unspecified: Secondary | ICD-10-CM | POA: Diagnosis not present

## 2015-11-05 DIAGNOSIS — E039 Hypothyroidism, unspecified: Secondary | ICD-10-CM | POA: Diagnosis not present

## 2015-11-09 DIAGNOSIS — L820 Inflamed seborrheic keratosis: Secondary | ICD-10-CM | POA: Diagnosis not present

## 2015-11-09 DIAGNOSIS — E039 Hypothyroidism, unspecified: Secondary | ICD-10-CM | POA: Diagnosis not present

## 2015-11-09 DIAGNOSIS — Z0000 Encounter for general adult medical examination without abnormal findings: Secondary | ICD-10-CM | POA: Diagnosis not present

## 2015-11-16 DIAGNOSIS — N419 Inflammatory disease of prostate, unspecified: Secondary | ICD-10-CM | POA: Diagnosis not present

## 2015-11-16 DIAGNOSIS — N401 Enlarged prostate with lower urinary tract symptoms: Secondary | ICD-10-CM | POA: Diagnosis not present

## 2015-11-26 DIAGNOSIS — I480 Paroxysmal atrial fibrillation: Secondary | ICD-10-CM | POA: Diagnosis not present

## 2015-11-26 DIAGNOSIS — R079 Chest pain, unspecified: Secondary | ICD-10-CM | POA: Diagnosis not present

## 2015-11-26 DIAGNOSIS — Z7901 Long term (current) use of anticoagulants: Secondary | ICD-10-CM | POA: Diagnosis not present

## 2015-12-21 DIAGNOSIS — K5900 Constipation, unspecified: Secondary | ICD-10-CM | POA: Diagnosis not present

## 2015-12-21 DIAGNOSIS — R351 Nocturia: Secondary | ICD-10-CM | POA: Diagnosis not present

## 2015-12-21 DIAGNOSIS — N419 Inflammatory disease of prostate, unspecified: Secondary | ICD-10-CM | POA: Diagnosis not present

## 2015-12-30 ENCOUNTER — Encounter: Payer: Self-pay | Admitting: Family

## 2015-12-31 DIAGNOSIS — Z7901 Long term (current) use of anticoagulants: Secondary | ICD-10-CM | POA: Diagnosis not present

## 2015-12-31 DIAGNOSIS — I480 Paroxysmal atrial fibrillation: Secondary | ICD-10-CM | POA: Diagnosis not present

## 2016-01-04 ENCOUNTER — Ambulatory Visit (INDEPENDENT_AMBULATORY_CARE_PROVIDER_SITE_OTHER): Payer: Medicare Other | Admitting: Family

## 2016-01-04 ENCOUNTER — Encounter: Payer: Self-pay | Admitting: Family

## 2016-01-04 ENCOUNTER — Ambulatory Visit (HOSPITAL_COMMUNITY)
Admission: RE | Admit: 2016-01-04 | Discharge: 2016-01-04 | Disposition: A | Discharge status: Home / Self Care | Payer: Medicare Other | Source: Ambulatory Visit | Attending: Family | Admitting: Family

## 2016-01-04 VITALS — BP 135/83 | HR 49 | Ht 72.0 in | Wt 202.0 lb

## 2016-01-04 DIAGNOSIS — I714 Abdominal aortic aneurysm, without rupture, unspecified: Secondary | ICD-10-CM

## 2016-01-04 DIAGNOSIS — Z4889 Encounter for other specified surgical aftercare: Secondary | ICD-10-CM

## 2016-01-04 DIAGNOSIS — Z95828 Presence of other vascular implants and grafts: Secondary | ICD-10-CM

## 2016-01-04 DIAGNOSIS — Z87891 Personal history of nicotine dependence: Secondary | ICD-10-CM

## 2016-01-04 DIAGNOSIS — Z48812 Encounter for surgical aftercare following surgery on the circulatory system: Secondary | ICD-10-CM

## 2016-01-04 NOTE — Progress Notes (Signed)
[facility] [place]  Established EVAR  History of Present Illness  Allen [name] is a 75 y.o. (1941/08/23) male patient of Dr. Donnetta Hutching who is s/p stab Phlebectomy: 10-20 incisions Sites: Thigh and Calf left leg on 03/13/13. He is also s/p EVAR  In 2008. He returns today for 2 year surveillance after a CTA of his EVAR at [facility] in May 2015. He reports chronic lower abdominal pain from numerous hernia repairs in his lower abdomen, hurts more with standing for a long period of time. He denies back pain.  The patient denies claudication symptoms with walking, denies non healing wounds.  He reports a stroke in 2007 while he was being prepared for a cardiac ablation; denies any subsequent stroke or TIA. He takes coumadin, noted history of atrial fibrillation. He takes a daily statin.   Most recent CTA (Date: May, 2015 at [facility]) demonstrates: no endoleak and 3.7 cm sac size.  Pt Diabetic: No Pt smoker: former smoker, quit in 1996   Past Medical History  Diagnosis Date  . Hypertension   . CVA (cerebral infarction) 1969  . Irregular heart beat   . AAA (abdominal aortic aneurysm) ([place])   . CHF (congestive heart failure) ([place])   . Prostatitis   . CAD (coronary artery disease)   . Thyroid disease     Hypothyrodidism  . Atrial fibrillation ([place])   . Varicose veins    Past Surgical History  Procedure Laterality Date  . Carotid endarterectomy  November 21, 2006    Left cea  . Inguinal hernia repair      X's  4  . Tonsillectomy and adenoidectomy    . Ablation    . Abdominal aortic aneurysm repair    . Inguinal hernia repair  09-2011    left side  . Endovenous ablation saphenous vein w/ laser Left 11-28-2012    left greater saphenous vein by Curt Jews MD  . Endovenous ablation saphenous vein w/ laser Right 12-26-2012    right gretaer saphenous vein by Curt Jews MD  . Stab phlebectomy Left 03-13-2013    10-20 incisions by Curt Jews MD    Social History Social History  Substance Use Topics  . Smoking status: Former Smoker    Types: Cigarettes    Quit date: 09/04/1993  . Smokeless tobacco: Never Used  . Alcohol Use: 0.0 oz/week    2-4 Glasses of wine per week   Family History Family History  Problem Relation Age of Onset  . Stroke Father   . Angina Mother   . Hypertension Sister   . Heart disease Sister    Current Outpatient Prescriptions on File Prior to Visit  Medication Sig Dispense Refill  . acyclovir (ZOVIRAX) 800 MG tablet Take 400 mg by mouth 2 (two) times daily.    Marland Kitchen amoxicillin-clavulanate (AUGMENTIN) 500-125 MG per tablet Take 1 tablet by mouth 2 (two) times daily. Reported on 01/04/2016    . aspirin 81 MG tablet Take 81 mg by mouth daily. Reported on 01/04/2016    . diclofenac (VOLTAREN) 75 MG EC tablet Take 75 mg by mouth 2 (two) times daily. Reported on 01/04/2016    . FINASTERIDE PO Take 5 mg by mouth daily.    . Levothyroxine Sodium (SYNTHROID PO) Take 175 mcg by mouth daily.     Marland Kitchen lisinopril (PRINIVIL,ZESTRIL) 10 MG tablet Take 20 mg by mouth daily. Reported on 01/04/2016    . metoprolol tartrate (LOPRESSOR) 25 MG tablet Take 25  mg by mouth 2 (two) times daily.    . propafenone (RYTHMOL) 150 MG tablet Take 150 mg by mouth 2 (two) times daily.    . psyllium (METAMUCIL) 58.6 % powder Take 1 packet by mouth 3 (three) times daily. Reported on 01/04/2016    . sildenafil (VIAGRA) 25 MG tablet Take 25 mg by mouth as needed. Reported on 01/04/2016    . silodosin (RAPAFLO) 8 MG CAPS capsule Take 8 mg by mouth daily with breakfast. Pt. States he takes 1 tablet daily for 3 days after intercourse.    . simvastatin (ZOCOR) 10 MG tablet Take 10 mg by mouth at bedtime. Reported on 01/04/2016    . Sulfamethoxazole-Trimethoprim (BACTRIM PO) Take by mouth daily. Reported on 01/04/2016    . Tamsulosin HCl (FLOMAX) 0.4 MG CAPS Take by mouth. Reported on 01/04/2016    . valACYclovir (VALTREX) 500 MG tablet Take 400 mg by mouth 2 (two)  times daily. Takes 1/2 tablet twice daily.    Marland Kitchen warfarin (COUMADIN) 4 MG tablet Take 4 mg by mouth daily.     No current facility-administered medications on file prior to visit.   No Known Allergies   ROS: See HPI for pertinent positives and negatives.  Physical Examination  Filed Vitals:   01/04/16 0824  BP: 135/83  Pulse: 49  Height: 6' (1.829 m)  Weight: 202 lb (91.627 kg)  SpO2: 98%   Body mass index is 27.39 kg/(m^2).  General: A&O x 3, WD.  Pulmonary: Sym exp, respirations are non labored, good air movt, CTAB Cardiac: RRR, Nl S1, S2, no detected murmur.  Vascular: Vessel Right Left  Radial 2+Palpable 2+Palpable  Carotid palpable without bruit Palpable without bruit  Aorta Not palpable N/A  Femoral 2+Palpable 2+Palpable  Popliteal Not palpable Not palpable  PT 2+Palpable 2+Palpable  DP 2+Palpable 2+Palpable   Gastrointestinal: soft, NTND, -G/R, - HSM, - palpable masses, - CVAT B.  Musculoskeletal: M/S 5/5 throughout, Extremities without ischemic changes.  Neurologic: Pain and light touch intact in extremities, CN 2-12 grossly intact, Motor exam as listed above. [place].          Non-Invasive Vascular Imaging  EVAR Duplex (Date: 01/04/2016)  ABDOMINAL AORTA DUPLEX EVALUATION - POST ENDOVASCULAR REPAIR    INDICATION: Follow up EVAR.    PREVIOUS INTERVENTION(S): EVAR 11/21/2006    DUPLEX EXAM:      DIAMETER AP (cm) DIAMETER TRANSVERSE (cm) VELOCITIES (cm/sec)  Aorta 3.8 3.5 55  Right Common Iliac 1.7 1.5 73  Left Common Iliac 1.3 1.4 104    Comparison Study  No previous studies at this facility     Date DIAMETER AP (cm) DIAMETER TRANSVERSE (cm)         ADDITIONAL FINDINGS:     IMPRESSION: No evidence of endoleak or significant stenosis.      Compared to the previous exam:  There are no previous examinations available for comparison.     Medical Decision Making  Allen [name] is a 75 y.o. male who presents  s/p EVAR in 2008. CTA of abdomen/pelvis performed on 01/07/14 at [facility] Radiology result was obtained which revealed stable post op changes are from stent graft repair of abdominal aortic aneurysm. The native aneurysm shows further decrease in size measuring 3.7 cm compared to 4.0 cm previously on 08/20/12. Post op changes in the suprapubic anterior abdominal wall are also stable. This result was reviewed by and discussed with Dr. Donnetta Hutching at pt's 05/26/14 visit.  Today's EVAR duplex indicates no endo  leak present and sac size is stable compared to CTA in May 2015: 3.8 cm.  He stopped smoking in 1996, his blood pressure remains in good control, and he does not have DM.   The patient will return in a year for EVAR duplex; if EVAR status remains stable at that time will consider stretching duplex surveillance to 2 years.  I emphasized the importance of maximal medical management including strict control of blood pressure, blood glucose, and lipid levels, antiplatelet agents, obtaining regular exercise, and cessation of smoking.   Thank you for allowing [place] to participate in this patient's care.  Clemon Chambers, RN, MSN, FNP-C [facility] [place] Office: [phone]  Clinic Physician: Early  01/04/2016, 8:48 AM

## 2016-01-11 DIAGNOSIS — K429 Umbilical hernia without obstruction or gangrene: Secondary | ICD-10-CM | POA: Diagnosis not present

## 2016-01-11 DIAGNOSIS — I10 Essential (primary) hypertension: Secondary | ICD-10-CM | POA: Diagnosis not present

## 2016-01-11 DIAGNOSIS — I25119 Atherosclerotic heart disease of native coronary artery with unspecified angina pectoris: Secondary | ICD-10-CM | POA: Diagnosis not present

## 2016-01-17 DIAGNOSIS — G5701 Lesion of sciatic nerve, right lower limb: Secondary | ICD-10-CM | POA: Diagnosis not present

## 2016-01-17 DIAGNOSIS — L820 Inflamed seborrheic keratosis: Secondary | ICD-10-CM | POA: Diagnosis not present

## 2016-01-24 DIAGNOSIS — L570 Actinic keratosis: Secondary | ICD-10-CM | POA: Diagnosis not present

## 2016-01-24 DIAGNOSIS — L820 Inflamed seborrheic keratosis: Secondary | ICD-10-CM | POA: Diagnosis not present

## 2016-01-24 DIAGNOSIS — L821 Other seborrheic keratosis: Secondary | ICD-10-CM | POA: Diagnosis not present

## 2016-01-24 DIAGNOSIS — L578 Other skin changes due to chronic exposure to nonionizing radiation: Secondary | ICD-10-CM | POA: Diagnosis not present

## 2016-01-27 DIAGNOSIS — K922 Gastrointestinal hemorrhage, unspecified: Secondary | ICD-10-CM | POA: Diagnosis not present

## 2016-01-28 DIAGNOSIS — I480 Paroxysmal atrial fibrillation: Secondary | ICD-10-CM | POA: Diagnosis not present

## 2016-01-28 DIAGNOSIS — Z7901 Long term (current) use of anticoagulants: Secondary | ICD-10-CM | POA: Diagnosis not present

## 2016-02-03 DIAGNOSIS — Z7901 Long term (current) use of anticoagulants: Secondary | ICD-10-CM | POA: Diagnosis not present

## 2016-02-04 DIAGNOSIS — K2950 Unspecified chronic gastritis without bleeding: Secondary | ICD-10-CM | POA: Diagnosis not present

## 2016-02-04 DIAGNOSIS — K922 Gastrointestinal hemorrhage, unspecified: Secondary | ICD-10-CM | POA: Diagnosis not present

## 2016-02-04 DIAGNOSIS — K2901 Acute gastritis with bleeding: Secondary | ICD-10-CM | POA: Diagnosis not present

## 2016-02-04 DIAGNOSIS — K921 Melena: Secondary | ICD-10-CM | POA: Diagnosis not present

## 2016-02-08 NOTE — Addendum Note (Signed)
Addended by: Thresa Ross C on: 02/08/2016 01:13 PM   Modules accepted: Orders

## 2016-02-11 DIAGNOSIS — I480 Paroxysmal atrial fibrillation: Secondary | ICD-10-CM | POA: Diagnosis not present

## 2016-02-11 DIAGNOSIS — Z7901 Long term (current) use of anticoagulants: Secondary | ICD-10-CM | POA: Diagnosis not present

## 2016-02-18 DIAGNOSIS — N419 Inflammatory disease of prostate, unspecified: Secondary | ICD-10-CM | POA: Diagnosis not present

## 2016-02-18 DIAGNOSIS — N401 Enlarged prostate with lower urinary tract symptoms: Secondary | ICD-10-CM | POA: Diagnosis not present

## 2016-02-21 DIAGNOSIS — H9319 Tinnitus, unspecified ear: Secondary | ICD-10-CM | POA: Diagnosis not present

## 2016-02-21 DIAGNOSIS — K429 Umbilical hernia without obstruction or gangrene: Secondary | ICD-10-CM | POA: Diagnosis not present

## 2016-02-25 DIAGNOSIS — I480 Paroxysmal atrial fibrillation: Secondary | ICD-10-CM | POA: Diagnosis not present

## 2016-02-25 DIAGNOSIS — Z7901 Long term (current) use of anticoagulants: Secondary | ICD-10-CM | POA: Diagnosis not present

## 2016-02-28 DIAGNOSIS — I480 Paroxysmal atrial fibrillation: Secondary | ICD-10-CM | POA: Diagnosis not present

## 2016-02-28 DIAGNOSIS — Z7901 Long term (current) use of anticoagulants: Secondary | ICD-10-CM | POA: Diagnosis not present

## 2016-03-03 DIAGNOSIS — H2513 Age-related nuclear cataract, bilateral: Secondary | ICD-10-CM | POA: Diagnosis not present

## 2016-03-06 DIAGNOSIS — Z86010 Personal history of colonic polyps: Secondary | ICD-10-CM | POA: Diagnosis not present

## 2016-03-06 DIAGNOSIS — K2901 Acute gastritis with bleeding: Secondary | ICD-10-CM | POA: Diagnosis not present

## 2016-03-06 DIAGNOSIS — K922 Gastrointestinal hemorrhage, unspecified: Secondary | ICD-10-CM | POA: Diagnosis not present

## 2016-03-13 DIAGNOSIS — Z7901 Long term (current) use of anticoagulants: Secondary | ICD-10-CM | POA: Diagnosis not present

## 2016-03-13 DIAGNOSIS — I480 Paroxysmal atrial fibrillation: Secondary | ICD-10-CM | POA: Diagnosis not present

## 2016-04-11 DIAGNOSIS — Z7901 Long term (current) use of anticoagulants: Secondary | ICD-10-CM | POA: Diagnosis not present

## 2016-04-12 DIAGNOSIS — K635 Polyp of colon: Secondary | ICD-10-CM | POA: Diagnosis not present

## 2016-04-12 DIAGNOSIS — Z1211 Encounter for screening for malignant neoplasm of colon: Secondary | ICD-10-CM | POA: Diagnosis not present

## 2016-04-12 DIAGNOSIS — K5730 Diverticulosis of large intestine without perforation or abscess without bleeding: Secondary | ICD-10-CM | POA: Diagnosis not present

## 2016-04-12 DIAGNOSIS — D123 Benign neoplasm of transverse colon: Secondary | ICD-10-CM | POA: Diagnosis not present

## 2016-04-12 DIAGNOSIS — Z86010 Personal history of colonic polyps: Secondary | ICD-10-CM | POA: Diagnosis not present

## 2016-04-17 DIAGNOSIS — I10 Essential (primary) hypertension: Secondary | ICD-10-CM | POA: Diagnosis not present

## 2016-04-17 DIAGNOSIS — Z79899 Other long term (current) drug therapy: Secondary | ICD-10-CM | POA: Diagnosis not present

## 2016-04-17 DIAGNOSIS — I482 Chronic atrial fibrillation: Secondary | ICD-10-CM | POA: Diagnosis not present

## 2016-04-17 DIAGNOSIS — I480 Paroxysmal atrial fibrillation: Secondary | ICD-10-CM | POA: Diagnosis not present

## 2016-04-17 DIAGNOSIS — E785 Hyperlipidemia, unspecified: Secondary | ICD-10-CM | POA: Diagnosis not present

## 2016-04-17 DIAGNOSIS — Z7901 Long term (current) use of anticoagulants: Secondary | ICD-10-CM | POA: Diagnosis not present

## 2016-04-24 DIAGNOSIS — Z7901 Long term (current) use of anticoagulants: Secondary | ICD-10-CM | POA: Diagnosis not present

## 2016-04-24 DIAGNOSIS — I482 Chronic atrial fibrillation: Secondary | ICD-10-CM | POA: Diagnosis not present

## 2016-05-04 ENCOUNTER — Other Ambulatory Visit: Payer: Self-pay

## 2016-05-09 DIAGNOSIS — I482 Chronic atrial fibrillation: Secondary | ICD-10-CM | POA: Diagnosis not present

## 2016-05-09 DIAGNOSIS — Z7901 Long term (current) use of anticoagulants: Secondary | ICD-10-CM | POA: Diagnosis not present

## 2016-05-16 DIAGNOSIS — K439 Ventral hernia without obstruction or gangrene: Secondary | ICD-10-CM | POA: Diagnosis not present

## 2016-05-16 DIAGNOSIS — R1033 Periumbilical pain: Secondary | ICD-10-CM

## 2016-05-16 HISTORY — DX: Ventral hernia without obstruction or gangrene: K43.9

## 2016-05-16 HISTORY — DX: Periumbilical pain: R10.33

## 2016-05-19 DIAGNOSIS — L219 Seborrheic dermatitis, unspecified: Secondary | ICD-10-CM | POA: Diagnosis not present

## 2016-05-19 DIAGNOSIS — C44319 Basal cell carcinoma of skin of other parts of face: Secondary | ICD-10-CM | POA: Diagnosis not present

## 2016-05-19 DIAGNOSIS — L570 Actinic keratosis: Secondary | ICD-10-CM | POA: Diagnosis not present

## 2016-05-19 DIAGNOSIS — L821 Other seborrheic keratosis: Secondary | ICD-10-CM | POA: Diagnosis not present

## 2016-05-22 DIAGNOSIS — N401 Enlarged prostate with lower urinary tract symptoms: Secondary | ICD-10-CM | POA: Diagnosis not present

## 2016-05-22 DIAGNOSIS — K5900 Constipation, unspecified: Secondary | ICD-10-CM | POA: Diagnosis not present

## 2016-05-22 DIAGNOSIS — N419 Inflammatory disease of prostate, unspecified: Secondary | ICD-10-CM | POA: Diagnosis not present

## 2016-05-22 DIAGNOSIS — Z125 Encounter for screening for malignant neoplasm of prostate: Secondary | ICD-10-CM | POA: Diagnosis not present

## 2016-05-24 DIAGNOSIS — E039 Hypothyroidism, unspecified: Secondary | ICD-10-CM | POA: Diagnosis not present

## 2016-05-24 DIAGNOSIS — K439 Ventral hernia without obstruction or gangrene: Secondary | ICD-10-CM | POA: Diagnosis not present

## 2016-05-24 DIAGNOSIS — I252 Old myocardial infarction: Secondary | ICD-10-CM | POA: Diagnosis not present

## 2016-05-24 DIAGNOSIS — R339 Retention of urine, unspecified: Secondary | ICD-10-CM | POA: Diagnosis not present

## 2016-05-24 DIAGNOSIS — Z87891 Personal history of nicotine dependence: Secondary | ICD-10-CM | POA: Diagnosis not present

## 2016-05-24 DIAGNOSIS — K219 Gastro-esophageal reflux disease without esophagitis: Secondary | ICD-10-CM | POA: Diagnosis not present

## 2016-05-24 DIAGNOSIS — K436 Other and unspecified ventral hernia with obstruction, without gangrene: Secondary | ICD-10-CM | POA: Diagnosis not present

## 2016-05-24 DIAGNOSIS — I10 Essential (primary) hypertension: Secondary | ICD-10-CM | POA: Diagnosis not present

## 2016-05-24 DIAGNOSIS — Z8673 Personal history of transient ischemic attack (TIA), and cerebral infarction without residual deficits: Secondary | ICD-10-CM | POA: Diagnosis not present

## 2016-05-24 DIAGNOSIS — Z79899 Other long term (current) drug therapy: Secondary | ICD-10-CM | POA: Diagnosis not present

## 2016-05-24 DIAGNOSIS — N401 Enlarged prostate with lower urinary tract symptoms: Secondary | ICD-10-CM | POA: Diagnosis not present

## 2016-05-25 DIAGNOSIS — Z87891 Personal history of nicotine dependence: Secondary | ICD-10-CM | POA: Diagnosis not present

## 2016-05-25 DIAGNOSIS — I480 Paroxysmal atrial fibrillation: Secondary | ICD-10-CM | POA: Diagnosis not present

## 2016-05-25 DIAGNOSIS — Z4889 Encounter for other specified surgical aftercare: Secondary | ICD-10-CM | POA: Diagnosis not present

## 2016-05-25 DIAGNOSIS — I10 Essential (primary) hypertension: Secondary | ICD-10-CM | POA: Diagnosis not present

## 2016-05-25 DIAGNOSIS — K439 Ventral hernia without obstruction or gangrene: Secondary | ICD-10-CM | POA: Diagnosis not present

## 2016-05-25 DIAGNOSIS — Z7901 Long term (current) use of anticoagulants: Secondary | ICD-10-CM | POA: Diagnosis not present

## 2016-05-25 DIAGNOSIS — R339 Retention of urine, unspecified: Secondary | ICD-10-CM | POA: Diagnosis not present

## 2016-05-27 DIAGNOSIS — Z87891 Personal history of nicotine dependence: Secondary | ICD-10-CM | POA: Diagnosis not present

## 2016-05-27 DIAGNOSIS — Z4889 Encounter for other specified surgical aftercare: Secondary | ICD-10-CM | POA: Diagnosis not present

## 2016-05-27 DIAGNOSIS — Z7901 Long term (current) use of anticoagulants: Secondary | ICD-10-CM | POA: Diagnosis not present

## 2016-05-27 DIAGNOSIS — K439 Ventral hernia without obstruction or gangrene: Secondary | ICD-10-CM | POA: Diagnosis not present

## 2016-05-27 DIAGNOSIS — I480 Paroxysmal atrial fibrillation: Secondary | ICD-10-CM | POA: Diagnosis not present

## 2016-05-27 DIAGNOSIS — I10 Essential (primary) hypertension: Secondary | ICD-10-CM | POA: Diagnosis not present

## 2016-05-29 DIAGNOSIS — K439 Ventral hernia without obstruction or gangrene: Secondary | ICD-10-CM | POA: Diagnosis not present

## 2016-05-29 DIAGNOSIS — Z87891 Personal history of nicotine dependence: Secondary | ICD-10-CM | POA: Diagnosis not present

## 2016-05-29 DIAGNOSIS — I480 Paroxysmal atrial fibrillation: Secondary | ICD-10-CM | POA: Diagnosis not present

## 2016-05-29 DIAGNOSIS — Z4889 Encounter for other specified surgical aftercare: Secondary | ICD-10-CM | POA: Diagnosis not present

## 2016-05-29 DIAGNOSIS — I10 Essential (primary) hypertension: Secondary | ICD-10-CM | POA: Diagnosis not present

## 2016-05-29 DIAGNOSIS — Z7901 Long term (current) use of anticoagulants: Secondary | ICD-10-CM | POA: Diagnosis not present

## 2016-05-31 DIAGNOSIS — I480 Paroxysmal atrial fibrillation: Secondary | ICD-10-CM | POA: Diagnosis not present

## 2016-05-31 DIAGNOSIS — Z7901 Long term (current) use of anticoagulants: Secondary | ICD-10-CM | POA: Diagnosis not present

## 2016-05-31 DIAGNOSIS — Z4889 Encounter for other specified surgical aftercare: Secondary | ICD-10-CM | POA: Diagnosis not present

## 2016-05-31 DIAGNOSIS — R339 Retention of urine, unspecified: Secondary | ICD-10-CM | POA: Diagnosis not present

## 2016-05-31 DIAGNOSIS — N401 Enlarged prostate with lower urinary tract symptoms: Secondary | ICD-10-CM | POA: Diagnosis not present

## 2016-05-31 DIAGNOSIS — Z87891 Personal history of nicotine dependence: Secondary | ICD-10-CM | POA: Diagnosis not present

## 2016-05-31 DIAGNOSIS — K439 Ventral hernia without obstruction or gangrene: Secondary | ICD-10-CM | POA: Diagnosis not present

## 2016-05-31 DIAGNOSIS — I10 Essential (primary) hypertension: Secondary | ICD-10-CM | POA: Diagnosis not present

## 2016-06-02 DIAGNOSIS — Z7901 Long term (current) use of anticoagulants: Secondary | ICD-10-CM | POA: Diagnosis not present

## 2016-06-02 DIAGNOSIS — I10 Essential (primary) hypertension: Secondary | ICD-10-CM | POA: Diagnosis not present

## 2016-06-02 DIAGNOSIS — Z87891 Personal history of nicotine dependence: Secondary | ICD-10-CM | POA: Diagnosis not present

## 2016-06-02 DIAGNOSIS — K439 Ventral hernia without obstruction or gangrene: Secondary | ICD-10-CM | POA: Diagnosis not present

## 2016-06-02 DIAGNOSIS — I480 Paroxysmal atrial fibrillation: Secondary | ICD-10-CM | POA: Diagnosis not present

## 2016-06-02 DIAGNOSIS — Z4889 Encounter for other specified surgical aftercare: Secondary | ICD-10-CM | POA: Diagnosis not present

## 2016-06-05 DIAGNOSIS — Z7901 Long term (current) use of anticoagulants: Secondary | ICD-10-CM | POA: Diagnosis not present

## 2016-06-05 DIAGNOSIS — I10 Essential (primary) hypertension: Secondary | ICD-10-CM | POA: Diagnosis not present

## 2016-06-05 DIAGNOSIS — I480 Paroxysmal atrial fibrillation: Secondary | ICD-10-CM | POA: Diagnosis not present

## 2016-06-05 DIAGNOSIS — K439 Ventral hernia without obstruction or gangrene: Secondary | ICD-10-CM | POA: Diagnosis not present

## 2016-06-05 DIAGNOSIS — Z4889 Encounter for other specified surgical aftercare: Secondary | ICD-10-CM | POA: Diagnosis not present

## 2016-06-05 DIAGNOSIS — Z87891 Personal history of nicotine dependence: Secondary | ICD-10-CM | POA: Diagnosis not present

## 2016-06-06 DIAGNOSIS — Z7901 Long term (current) use of anticoagulants: Secondary | ICD-10-CM | POA: Diagnosis not present

## 2016-06-06 DIAGNOSIS — I482 Chronic atrial fibrillation: Secondary | ICD-10-CM | POA: Diagnosis not present

## 2016-06-09 DIAGNOSIS — Z87891 Personal history of nicotine dependence: Secondary | ICD-10-CM | POA: Diagnosis not present

## 2016-06-09 DIAGNOSIS — I10 Essential (primary) hypertension: Secondary | ICD-10-CM | POA: Diagnosis not present

## 2016-06-09 DIAGNOSIS — Z4889 Encounter for other specified surgical aftercare: Secondary | ICD-10-CM | POA: Diagnosis not present

## 2016-06-09 DIAGNOSIS — I480 Paroxysmal atrial fibrillation: Secondary | ICD-10-CM | POA: Diagnosis not present

## 2016-06-09 DIAGNOSIS — K439 Ventral hernia without obstruction or gangrene: Secondary | ICD-10-CM | POA: Diagnosis not present

## 2016-06-09 DIAGNOSIS — Z7901 Long term (current) use of anticoagulants: Secondary | ICD-10-CM | POA: Diagnosis not present

## 2016-06-12 DIAGNOSIS — Z23 Encounter for immunization: Secondary | ICD-10-CM | POA: Diagnosis not present

## 2016-06-13 DIAGNOSIS — I480 Paroxysmal atrial fibrillation: Secondary | ICD-10-CM | POA: Diagnosis not present

## 2016-06-13 DIAGNOSIS — I482 Chronic atrial fibrillation: Secondary | ICD-10-CM | POA: Diagnosis not present

## 2016-06-13 DIAGNOSIS — Z7901 Long term (current) use of anticoagulants: Secondary | ICD-10-CM | POA: Diagnosis not present

## 2016-06-23 DIAGNOSIS — J329 Chronic sinusitis, unspecified: Secondary | ICD-10-CM | POA: Diagnosis not present

## 2016-06-23 DIAGNOSIS — E039 Hypothyroidism, unspecified: Secondary | ICD-10-CM | POA: Diagnosis not present

## 2016-06-23 DIAGNOSIS — Z79899 Other long term (current) drug therapy: Secondary | ICD-10-CM | POA: Diagnosis not present

## 2016-06-23 DIAGNOSIS — K219 Gastro-esophageal reflux disease without esophagitis: Secondary | ICD-10-CM | POA: Diagnosis not present

## 2016-06-27 DIAGNOSIS — I480 Paroxysmal atrial fibrillation: Secondary | ICD-10-CM | POA: Diagnosis not present

## 2016-06-27 DIAGNOSIS — I482 Chronic atrial fibrillation: Secondary | ICD-10-CM | POA: Diagnosis not present

## 2016-07-03 DIAGNOSIS — L820 Inflamed seborrheic keratosis: Secondary | ICD-10-CM | POA: Diagnosis not present

## 2016-07-03 DIAGNOSIS — L578 Other skin changes due to chronic exposure to nonionizing radiation: Secondary | ICD-10-CM | POA: Diagnosis not present

## 2016-07-03 DIAGNOSIS — L219 Seborrheic dermatitis, unspecified: Secondary | ICD-10-CM | POA: Diagnosis not present

## 2016-07-03 DIAGNOSIS — L570 Actinic keratosis: Secondary | ICD-10-CM | POA: Diagnosis not present

## 2016-07-03 DIAGNOSIS — C44319 Basal cell carcinoma of skin of other parts of face: Secondary | ICD-10-CM | POA: Diagnosis not present

## 2016-07-11 DIAGNOSIS — I482 Chronic atrial fibrillation: Secondary | ICD-10-CM | POA: Diagnosis not present

## 2016-07-11 DIAGNOSIS — Z7901 Long term (current) use of anticoagulants: Secondary | ICD-10-CM | POA: Diagnosis not present

## 2016-08-08 DIAGNOSIS — I482 Chronic atrial fibrillation: Secondary | ICD-10-CM | POA: Diagnosis not present

## 2016-08-08 DIAGNOSIS — Z7901 Long term (current) use of anticoagulants: Secondary | ICD-10-CM | POA: Diagnosis not present

## 2016-08-16 DIAGNOSIS — L570 Actinic keratosis: Secondary | ICD-10-CM | POA: Diagnosis not present

## 2016-08-16 DIAGNOSIS — L219 Seborrheic dermatitis, unspecified: Secondary | ICD-10-CM | POA: Diagnosis not present

## 2016-08-16 DIAGNOSIS — C44311 Basal cell carcinoma of skin of nose: Secondary | ICD-10-CM | POA: Diagnosis not present

## 2016-09-01 DIAGNOSIS — Z7901 Long term (current) use of anticoagulants: Secondary | ICD-10-CM | POA: Diagnosis not present

## 2016-09-01 DIAGNOSIS — I482 Chronic atrial fibrillation: Secondary | ICD-10-CM | POA: Diagnosis not present

## 2016-09-11 DIAGNOSIS — N401 Enlarged prostate with lower urinary tract symptoms: Secondary | ICD-10-CM | POA: Diagnosis not present

## 2016-09-11 DIAGNOSIS — K5900 Constipation, unspecified: Secondary | ICD-10-CM | POA: Diagnosis not present

## 2016-09-29 DIAGNOSIS — Z7901 Long term (current) use of anticoagulants: Secondary | ICD-10-CM | POA: Diagnosis not present

## 2016-09-29 DIAGNOSIS — I482 Chronic atrial fibrillation: Secondary | ICD-10-CM | POA: Diagnosis not present

## 2016-10-18 DIAGNOSIS — I480 Paroxysmal atrial fibrillation: Secondary | ICD-10-CM | POA: Diagnosis not present

## 2016-10-18 DIAGNOSIS — Z7901 Long term (current) use of anticoagulants: Secondary | ICD-10-CM | POA: Diagnosis not present

## 2016-10-18 DIAGNOSIS — Z79899 Other long term (current) drug therapy: Secondary | ICD-10-CM | POA: Diagnosis not present

## 2016-10-18 DIAGNOSIS — E785 Hyperlipidemia, unspecified: Secondary | ICD-10-CM | POA: Diagnosis not present

## 2016-10-18 DIAGNOSIS — I10 Essential (primary) hypertension: Secondary | ICD-10-CM | POA: Diagnosis not present

## 2016-11-17 DIAGNOSIS — I480 Paroxysmal atrial fibrillation: Secondary | ICD-10-CM | POA: Diagnosis not present

## 2016-11-17 DIAGNOSIS — Z7901 Long term (current) use of anticoagulants: Secondary | ICD-10-CM | POA: Diagnosis not present

## 2016-12-04 DIAGNOSIS — Z7901 Long term (current) use of anticoagulants: Secondary | ICD-10-CM | POA: Diagnosis not present

## 2016-12-04 DIAGNOSIS — Z79899 Other long term (current) drug therapy: Secondary | ICD-10-CM | POA: Diagnosis not present

## 2016-12-04 DIAGNOSIS — Z8679 Personal history of other diseases of the circulatory system: Secondary | ICD-10-CM | POA: Diagnosis not present

## 2016-12-04 DIAGNOSIS — R002 Palpitations: Secondary | ICD-10-CM | POA: Diagnosis not present

## 2016-12-04 DIAGNOSIS — I480 Paroxysmal atrial fibrillation: Secondary | ICD-10-CM | POA: Diagnosis not present

## 2016-12-13 DIAGNOSIS — I517 Cardiomegaly: Secondary | ICD-10-CM | POA: Diagnosis not present

## 2016-12-13 DIAGNOSIS — Z7901 Long term (current) use of anticoagulants: Secondary | ICD-10-CM | POA: Diagnosis not present

## 2016-12-13 DIAGNOSIS — I480 Paroxysmal atrial fibrillation: Secondary | ICD-10-CM | POA: Diagnosis not present

## 2016-12-13 DIAGNOSIS — R002 Palpitations: Secondary | ICD-10-CM | POA: Diagnosis not present

## 2016-12-27 DIAGNOSIS — I480 Paroxysmal atrial fibrillation: Secondary | ICD-10-CM | POA: Diagnosis not present

## 2016-12-27 DIAGNOSIS — Z7901 Long term (current) use of anticoagulants: Secondary | ICD-10-CM | POA: Diagnosis not present

## 2016-12-28 DIAGNOSIS — L299 Pruritus, unspecified: Secondary | ICD-10-CM | POA: Diagnosis not present

## 2016-12-28 DIAGNOSIS — L02416 Cutaneous abscess of left lower limb: Secondary | ICD-10-CM | POA: Diagnosis not present

## 2017-01-01 DIAGNOSIS — Z7901 Long term (current) use of anticoagulants: Secondary | ICD-10-CM | POA: Diagnosis not present

## 2017-01-01 DIAGNOSIS — I480 Paroxysmal atrial fibrillation: Secondary | ICD-10-CM | POA: Diagnosis not present

## 2017-01-11 DIAGNOSIS — R002 Palpitations: Secondary | ICD-10-CM | POA: Diagnosis not present

## 2017-01-11 DIAGNOSIS — I480 Paroxysmal atrial fibrillation: Secondary | ICD-10-CM | POA: Diagnosis not present

## 2017-01-18 DIAGNOSIS — I480 Paroxysmal atrial fibrillation: Secondary | ICD-10-CM | POA: Diagnosis not present

## 2017-01-18 DIAGNOSIS — Z79899 Other long term (current) drug therapy: Secondary | ICD-10-CM | POA: Diagnosis not present

## 2017-01-18 DIAGNOSIS — I10 Essential (primary) hypertension: Secondary | ICD-10-CM | POA: Diagnosis not present

## 2017-01-18 DIAGNOSIS — E785 Hyperlipidemia, unspecified: Secondary | ICD-10-CM | POA: Diagnosis not present

## 2017-01-18 DIAGNOSIS — Z7901 Long term (current) use of anticoagulants: Secondary | ICD-10-CM | POA: Diagnosis not present

## 2017-02-06 ENCOUNTER — Ambulatory Visit: Payer: Medicare Other | Admitting: Family

## 2017-02-06 ENCOUNTER — Other Ambulatory Visit (HOSPITAL_COMMUNITY): Payer: Medicare Other

## 2017-02-15 DIAGNOSIS — I480 Paroxysmal atrial fibrillation: Secondary | ICD-10-CM | POA: Diagnosis not present

## 2017-02-15 DIAGNOSIS — Z7901 Long term (current) use of anticoagulants: Secondary | ICD-10-CM | POA: Diagnosis not present

## 2017-03-15 DIAGNOSIS — I480 Paroxysmal atrial fibrillation: Secondary | ICD-10-CM | POA: Diagnosis not present

## 2017-03-15 DIAGNOSIS — Z7901 Long term (current) use of anticoagulants: Secondary | ICD-10-CM | POA: Diagnosis not present

## 2017-04-16 DIAGNOSIS — I480 Paroxysmal atrial fibrillation: Secondary | ICD-10-CM | POA: Diagnosis not present

## 2017-04-16 DIAGNOSIS — Z7901 Long term (current) use of anticoagulants: Secondary | ICD-10-CM | POA: Diagnosis not present

## 2017-04-26 DIAGNOSIS — I480 Paroxysmal atrial fibrillation: Secondary | ICD-10-CM | POA: Diagnosis not present

## 2017-05-14 DIAGNOSIS — Z7901 Long term (current) use of anticoagulants: Secondary | ICD-10-CM | POA: Diagnosis not present

## 2017-05-21 DIAGNOSIS — L570 Actinic keratosis: Secondary | ICD-10-CM | POA: Diagnosis not present

## 2017-05-21 DIAGNOSIS — L219 Seborrheic dermatitis, unspecified: Secondary | ICD-10-CM | POA: Diagnosis not present

## 2017-05-22 DIAGNOSIS — I480 Paroxysmal atrial fibrillation: Secondary | ICD-10-CM | POA: Diagnosis not present

## 2017-05-22 DIAGNOSIS — N419 Inflammatory disease of prostate, unspecified: Secondary | ICD-10-CM | POA: Diagnosis not present

## 2017-05-22 DIAGNOSIS — I714 Abdominal aortic aneurysm, without rupture: Secondary | ICD-10-CM | POA: Diagnosis not present

## 2017-05-22 DIAGNOSIS — I471 Supraventricular tachycardia, unspecified: Secondary | ICD-10-CM

## 2017-05-22 DIAGNOSIS — Z7901 Long term (current) use of anticoagulants: Secondary | ICD-10-CM | POA: Diagnosis not present

## 2017-05-22 DIAGNOSIS — Z125 Encounter for screening for malignant neoplasm of prostate: Secondary | ICD-10-CM | POA: Diagnosis not present

## 2017-05-22 DIAGNOSIS — E039 Hypothyroidism, unspecified: Secondary | ICD-10-CM | POA: Diagnosis not present

## 2017-05-22 DIAGNOSIS — I440 Atrioventricular block, first degree: Secondary | ICD-10-CM | POA: Diagnosis not present

## 2017-05-22 DIAGNOSIS — N401 Enlarged prostate with lower urinary tract symptoms: Secondary | ICD-10-CM | POA: Diagnosis not present

## 2017-05-22 DIAGNOSIS — E7800 Pure hypercholesterolemia, unspecified: Secondary | ICD-10-CM | POA: Diagnosis not present

## 2017-05-22 HISTORY — DX: Supraventricular tachycardia, unspecified: I47.10

## 2017-05-22 HISTORY — DX: Supraventricular tachycardia: I47.1

## 2017-05-28 DIAGNOSIS — I480 Paroxysmal atrial fibrillation: Secondary | ICD-10-CM | POA: Diagnosis not present

## 2017-05-28 DIAGNOSIS — I10 Essential (primary) hypertension: Secondary | ICD-10-CM | POA: Diagnosis not present

## 2017-05-28 DIAGNOSIS — E785 Hyperlipidemia, unspecified: Secondary | ICD-10-CM | POA: Diagnosis not present

## 2017-05-28 DIAGNOSIS — E039 Hypothyroidism, unspecified: Secondary | ICD-10-CM | POA: Diagnosis not present

## 2017-06-04 DIAGNOSIS — Z23 Encounter for immunization: Secondary | ICD-10-CM | POA: Diagnosis not present

## 2017-06-04 DIAGNOSIS — Z7901 Long term (current) use of anticoagulants: Secondary | ICD-10-CM | POA: Diagnosis not present

## 2017-06-04 DIAGNOSIS — I480 Paroxysmal atrial fibrillation: Secondary | ICD-10-CM | POA: Diagnosis not present

## 2017-06-28 DIAGNOSIS — Z09 Encounter for follow-up examination after completed treatment for conditions other than malignant neoplasm: Secondary | ICD-10-CM | POA: Diagnosis not present

## 2017-06-28 DIAGNOSIS — R1031 Right lower quadrant pain: Secondary | ICD-10-CM

## 2017-06-28 HISTORY — DX: Right lower quadrant pain: R10.31

## 2017-07-02 DIAGNOSIS — Z7901 Long term (current) use of anticoagulants: Secondary | ICD-10-CM | POA: Diagnosis not present

## 2017-07-02 DIAGNOSIS — I480 Paroxysmal atrial fibrillation: Secondary | ICD-10-CM | POA: Diagnosis not present

## 2017-07-11 DIAGNOSIS — Z1331 Encounter for screening for depression: Secondary | ICD-10-CM | POA: Diagnosis not present

## 2017-07-11 DIAGNOSIS — M7711 Lateral epicondylitis, right elbow: Secondary | ICD-10-CM | POA: Diagnosis not present

## 2017-07-11 DIAGNOSIS — Z8719 Personal history of other diseases of the digestive system: Secondary | ICD-10-CM | POA: Diagnosis not present

## 2017-07-11 DIAGNOSIS — Z9889 Other specified postprocedural states: Secondary | ICD-10-CM | POA: Diagnosis not present

## 2017-08-02 DIAGNOSIS — Z7901 Long term (current) use of anticoagulants: Secondary | ICD-10-CM | POA: Diagnosis not present

## 2017-08-02 DIAGNOSIS — I480 Paroxysmal atrial fibrillation: Secondary | ICD-10-CM | POA: Diagnosis not present

## 2017-08-30 DIAGNOSIS — I4891 Unspecified atrial fibrillation: Secondary | ICD-10-CM | POA: Diagnosis not present

## 2017-08-30 DIAGNOSIS — Z7901 Long term (current) use of anticoagulants: Secondary | ICD-10-CM | POA: Diagnosis not present

## 2017-09-12 DIAGNOSIS — I480 Paroxysmal atrial fibrillation: Secondary | ICD-10-CM | POA: Diagnosis not present

## 2017-09-17 DIAGNOSIS — Z9889 Other specified postprocedural states: Secondary | ICD-10-CM | POA: Diagnosis not present

## 2017-09-17 DIAGNOSIS — M7711 Lateral epicondylitis, right elbow: Secondary | ICD-10-CM | POA: Diagnosis not present

## 2017-09-17 DIAGNOSIS — Z8719 Personal history of other diseases of the digestive system: Secondary | ICD-10-CM | POA: Diagnosis not present

## 2017-09-28 DIAGNOSIS — J019 Acute sinusitis, unspecified: Secondary | ICD-10-CM | POA: Diagnosis not present

## 2017-09-28 DIAGNOSIS — B9689 Other specified bacterial agents as the cause of diseases classified elsewhere: Secondary | ICD-10-CM | POA: Diagnosis not present

## 2017-10-01 DIAGNOSIS — E785 Hyperlipidemia, unspecified: Secondary | ICD-10-CM

## 2017-10-01 DIAGNOSIS — I714 Abdominal aortic aneurysm, without rupture: Secondary | ICD-10-CM | POA: Diagnosis not present

## 2017-10-01 DIAGNOSIS — R0609 Other forms of dyspnea: Secondary | ICD-10-CM | POA: Insufficient documentation

## 2017-10-01 DIAGNOSIS — I480 Paroxysmal atrial fibrillation: Secondary | ICD-10-CM | POA: Diagnosis not present

## 2017-10-01 DIAGNOSIS — R7989 Other specified abnormal findings of blood chemistry: Secondary | ICD-10-CM | POA: Diagnosis not present

## 2017-10-01 DIAGNOSIS — I10 Essential (primary) hypertension: Secondary | ICD-10-CM | POA: Diagnosis not present

## 2017-10-01 DIAGNOSIS — R0789 Other chest pain: Secondary | ICD-10-CM

## 2017-10-01 DIAGNOSIS — E039 Hypothyroidism, unspecified: Secondary | ICD-10-CM | POA: Diagnosis not present

## 2017-10-01 DIAGNOSIS — Z7901 Long term (current) use of anticoagulants: Secondary | ICD-10-CM | POA: Diagnosis not present

## 2017-10-01 HISTORY — DX: Other chest pain: R07.89

## 2017-10-01 HISTORY — DX: Hyperlipidemia, unspecified: E78.5

## 2017-10-01 HISTORY — DX: Other forms of dyspnea: R06.09

## 2017-10-04 DIAGNOSIS — L570 Actinic keratosis: Secondary | ICD-10-CM | POA: Diagnosis not present

## 2017-10-04 DIAGNOSIS — L820 Inflamed seborrheic keratosis: Secondary | ICD-10-CM | POA: Diagnosis not present

## 2017-10-04 DIAGNOSIS — L209 Atopic dermatitis, unspecified: Secondary | ICD-10-CM | POA: Diagnosis not present

## 2017-10-04 DIAGNOSIS — L853 Xerosis cutis: Secondary | ICD-10-CM | POA: Diagnosis not present

## 2017-10-10 DIAGNOSIS — E039 Hypothyroidism, unspecified: Secondary | ICD-10-CM | POA: Diagnosis not present

## 2017-10-15 DIAGNOSIS — I480 Paroxysmal atrial fibrillation: Secondary | ICD-10-CM | POA: Diagnosis not present

## 2017-10-15 DIAGNOSIS — Z7901 Long term (current) use of anticoagulants: Secondary | ICD-10-CM | POA: Diagnosis not present

## 2017-10-31 DIAGNOSIS — N401 Enlarged prostate with lower urinary tract symptoms: Secondary | ICD-10-CM | POA: Diagnosis not present

## 2017-10-31 DIAGNOSIS — N411 Chronic prostatitis: Secondary | ICD-10-CM | POA: Diagnosis not present

## 2017-11-12 DIAGNOSIS — Z7901 Long term (current) use of anticoagulants: Secondary | ICD-10-CM | POA: Diagnosis not present

## 2017-11-28 DIAGNOSIS — E039 Hypothyroidism, unspecified: Secondary | ICD-10-CM | POA: Diagnosis not present

## 2017-12-10 DIAGNOSIS — Z7901 Long term (current) use of anticoagulants: Secondary | ICD-10-CM | POA: Diagnosis not present

## 2017-12-11 DIAGNOSIS — R002 Palpitations: Secondary | ICD-10-CM | POA: Diagnosis not present

## 2018-01-07 DIAGNOSIS — Z7901 Long term (current) use of anticoagulants: Secondary | ICD-10-CM | POA: Diagnosis not present

## 2018-03-04 DIAGNOSIS — Z7901 Long term (current) use of anticoagulants: Secondary | ICD-10-CM | POA: Diagnosis not present

## 2018-03-04 DIAGNOSIS — I8310 Varicose veins of unspecified lower extremity with inflammation: Secondary | ICD-10-CM | POA: Diagnosis not present

## 2018-03-04 DIAGNOSIS — Z1339 Encounter for screening examination for other mental health and behavioral disorders: Secondary | ICD-10-CM | POA: Diagnosis not present

## 2018-03-27 DIAGNOSIS — H2513 Age-related nuclear cataract, bilateral: Secondary | ICD-10-CM | POA: Diagnosis not present

## 2018-04-09 DIAGNOSIS — Z7901 Long term (current) use of anticoagulants: Secondary | ICD-10-CM | POA: Diagnosis not present

## 2018-04-09 DIAGNOSIS — I480 Paroxysmal atrial fibrillation: Secondary | ICD-10-CM | POA: Diagnosis not present

## 2018-04-25 DIAGNOSIS — H43813 Vitreous degeneration, bilateral: Secondary | ICD-10-CM | POA: Diagnosis not present

## 2018-04-25 DIAGNOSIS — H35373 Puckering of macula, bilateral: Secondary | ICD-10-CM | POA: Diagnosis not present

## 2018-04-25 DIAGNOSIS — H25812 Combined forms of age-related cataract, left eye: Secondary | ICD-10-CM | POA: Diagnosis not present

## 2018-04-25 DIAGNOSIS — H2511 Age-related nuclear cataract, right eye: Secondary | ICD-10-CM | POA: Diagnosis not present

## 2018-04-29 DIAGNOSIS — I471 Supraventricular tachycardia: Secondary | ICD-10-CM | POA: Diagnosis not present

## 2018-04-29 DIAGNOSIS — E785 Hyperlipidemia, unspecified: Secondary | ICD-10-CM | POA: Diagnosis not present

## 2018-04-29 DIAGNOSIS — I714 Abdominal aortic aneurysm, without rupture: Secondary | ICD-10-CM | POA: Diagnosis not present

## 2018-04-29 DIAGNOSIS — I480 Paroxysmal atrial fibrillation: Secondary | ICD-10-CM | POA: Diagnosis not present

## 2018-04-29 DIAGNOSIS — I4891 Unspecified atrial fibrillation: Secondary | ICD-10-CM | POA: Diagnosis not present

## 2018-04-29 DIAGNOSIS — Z7901 Long term (current) use of anticoagulants: Secondary | ICD-10-CM | POA: Diagnosis not present

## 2018-05-17 DIAGNOSIS — K5900 Constipation, unspecified: Secondary | ICD-10-CM | POA: Diagnosis not present

## 2018-05-17 DIAGNOSIS — N411 Chronic prostatitis: Secondary | ICD-10-CM | POA: Diagnosis not present

## 2018-05-24 DIAGNOSIS — H2512 Age-related nuclear cataract, left eye: Secondary | ICD-10-CM | POA: Diagnosis not present

## 2018-05-24 DIAGNOSIS — Z01818 Encounter for other preprocedural examination: Secondary | ICD-10-CM | POA: Diagnosis not present

## 2018-05-27 DIAGNOSIS — Z7901 Long term (current) use of anticoagulants: Secondary | ICD-10-CM | POA: Diagnosis not present

## 2018-06-04 DIAGNOSIS — N411 Chronic prostatitis: Secondary | ICD-10-CM | POA: Diagnosis not present

## 2018-06-04 DIAGNOSIS — K5900 Constipation, unspecified: Secondary | ICD-10-CM | POA: Diagnosis not present

## 2018-06-05 DIAGNOSIS — Z23 Encounter for immunization: Secondary | ICD-10-CM | POA: Diagnosis not present

## 2018-06-11 DIAGNOSIS — H259 Unspecified age-related cataract: Secondary | ICD-10-CM | POA: Diagnosis not present

## 2018-06-11 DIAGNOSIS — Z8673 Personal history of transient ischemic attack (TIA), and cerebral infarction without residual deficits: Secondary | ICD-10-CM | POA: Diagnosis not present

## 2018-06-11 DIAGNOSIS — I10 Essential (primary) hypertension: Secondary | ICD-10-CM | POA: Diagnosis not present

## 2018-06-11 DIAGNOSIS — Z85828 Personal history of other malignant neoplasm of skin: Secondary | ICD-10-CM | POA: Diagnosis not present

## 2018-06-11 DIAGNOSIS — E039 Hypothyroidism, unspecified: Secondary | ICD-10-CM | POA: Diagnosis not present

## 2018-06-11 DIAGNOSIS — J45909 Unspecified asthma, uncomplicated: Secondary | ICD-10-CM | POA: Diagnosis not present

## 2018-06-11 DIAGNOSIS — Z7901 Long term (current) use of anticoagulants: Secondary | ICD-10-CM | POA: Diagnosis not present

## 2018-06-11 DIAGNOSIS — Z79899 Other long term (current) drug therapy: Secondary | ICD-10-CM | POA: Diagnosis not present

## 2018-06-11 DIAGNOSIS — E7800 Pure hypercholesterolemia, unspecified: Secondary | ICD-10-CM | POA: Diagnosis not present

## 2018-06-11 DIAGNOSIS — H35373 Puckering of macula, bilateral: Secondary | ICD-10-CM | POA: Diagnosis not present

## 2018-06-11 DIAGNOSIS — K219 Gastro-esophageal reflux disease without esophagitis: Secondary | ICD-10-CM | POA: Diagnosis not present

## 2018-06-11 DIAGNOSIS — Z955 Presence of coronary angioplasty implant and graft: Secondary | ICD-10-CM | POA: Diagnosis not present

## 2018-06-11 DIAGNOSIS — Z87891 Personal history of nicotine dependence: Secondary | ICD-10-CM | POA: Diagnosis not present

## 2018-06-11 DIAGNOSIS — I4891 Unspecified atrial fibrillation: Secondary | ICD-10-CM | POA: Diagnosis not present

## 2018-06-11 DIAGNOSIS — I714 Abdominal aortic aneurysm, without rupture: Secondary | ICD-10-CM | POA: Diagnosis not present

## 2018-06-11 DIAGNOSIS — I471 Supraventricular tachycardia: Secondary | ICD-10-CM | POA: Diagnosis not present

## 2018-06-11 DIAGNOSIS — H2512 Age-related nuclear cataract, left eye: Secondary | ICD-10-CM | POA: Diagnosis not present

## 2018-06-11 DIAGNOSIS — H52223 Regular astigmatism, bilateral: Secondary | ICD-10-CM | POA: Diagnosis not present

## 2018-06-24 DIAGNOSIS — Z7901 Long term (current) use of anticoagulants: Secondary | ICD-10-CM | POA: Diagnosis not present

## 2018-07-16 DIAGNOSIS — Z85828 Personal history of other malignant neoplasm of skin: Secondary | ICD-10-CM | POA: Diagnosis not present

## 2018-07-16 DIAGNOSIS — Z87891 Personal history of nicotine dependence: Secondary | ICD-10-CM | POA: Diagnosis not present

## 2018-07-16 DIAGNOSIS — E039 Hypothyroidism, unspecified: Secondary | ICD-10-CM | POA: Diagnosis not present

## 2018-07-16 DIAGNOSIS — Z7901 Long term (current) use of anticoagulants: Secondary | ICD-10-CM | POA: Diagnosis not present

## 2018-07-16 DIAGNOSIS — H2511 Age-related nuclear cataract, right eye: Secondary | ICD-10-CM | POA: Diagnosis not present

## 2018-07-16 DIAGNOSIS — J45909 Unspecified asthma, uncomplicated: Secondary | ICD-10-CM | POA: Diagnosis not present

## 2018-07-16 DIAGNOSIS — H35373 Puckering of macula, bilateral: Secondary | ICD-10-CM | POA: Diagnosis not present

## 2018-07-16 DIAGNOSIS — E7800 Pure hypercholesterolemia, unspecified: Secondary | ICD-10-CM | POA: Diagnosis not present

## 2018-07-16 DIAGNOSIS — I4891 Unspecified atrial fibrillation: Secondary | ICD-10-CM | POA: Diagnosis not present

## 2018-07-16 DIAGNOSIS — I10 Essential (primary) hypertension: Secondary | ICD-10-CM | POA: Diagnosis not present

## 2018-07-16 DIAGNOSIS — H259 Unspecified age-related cataract: Secondary | ICD-10-CM | POA: Diagnosis not present

## 2018-07-16 DIAGNOSIS — K219 Gastro-esophageal reflux disease without esophagitis: Secondary | ICD-10-CM | POA: Diagnosis not present

## 2018-07-16 DIAGNOSIS — Z8673 Personal history of transient ischemic attack (TIA), and cerebral infarction without residual deficits: Secondary | ICD-10-CM | POA: Diagnosis not present

## 2018-07-16 DIAGNOSIS — Z79899 Other long term (current) drug therapy: Secondary | ICD-10-CM | POA: Diagnosis not present

## 2018-07-22 DIAGNOSIS — Z7901 Long term (current) use of anticoagulants: Secondary | ICD-10-CM | POA: Diagnosis not present

## 2018-08-19 DIAGNOSIS — Z7901 Long term (current) use of anticoagulants: Secondary | ICD-10-CM | POA: Diagnosis not present

## 2018-09-08 DIAGNOSIS — J069 Acute upper respiratory infection, unspecified: Secondary | ICD-10-CM | POA: Diagnosis not present

## 2018-09-12 DIAGNOSIS — H6123 Impacted cerumen, bilateral: Secondary | ICD-10-CM | POA: Diagnosis not present

## 2018-09-12 DIAGNOSIS — Z2239 Carrier of other specified bacterial diseases: Secondary | ICD-10-CM | POA: Diagnosis not present

## 2018-09-13 DIAGNOSIS — J988 Other specified respiratory disorders: Secondary | ICD-10-CM | POA: Diagnosis not present

## 2018-09-13 DIAGNOSIS — R062 Wheezing: Secondary | ICD-10-CM | POA: Diagnosis not present

## 2018-09-16 DIAGNOSIS — Z7901 Long term (current) use of anticoagulants: Secondary | ICD-10-CM | POA: Diagnosis not present

## 2018-09-25 DIAGNOSIS — I480 Paroxysmal atrial fibrillation: Secondary | ICD-10-CM | POA: Diagnosis not present

## 2018-09-25 DIAGNOSIS — Z7901 Long term (current) use of anticoagulants: Secondary | ICD-10-CM | POA: Diagnosis not present

## 2018-10-02 DIAGNOSIS — Z7901 Long term (current) use of anticoagulants: Secondary | ICD-10-CM | POA: Diagnosis not present

## 2018-10-14 DIAGNOSIS — I480 Paroxysmal atrial fibrillation: Secondary | ICD-10-CM | POA: Diagnosis not present

## 2018-10-14 DIAGNOSIS — Z7901 Long term (current) use of anticoagulants: Secondary | ICD-10-CM | POA: Diagnosis not present

## 2018-10-21 DIAGNOSIS — S91331A Puncture wound without foreign body, right foot, initial encounter: Secondary | ICD-10-CM | POA: Diagnosis not present

## 2018-10-28 DIAGNOSIS — R0789 Other chest pain: Secondary | ICD-10-CM | POA: Diagnosis not present

## 2018-10-28 DIAGNOSIS — I714 Abdominal aortic aneurysm, without rupture: Secondary | ICD-10-CM | POA: Diagnosis not present

## 2018-10-28 DIAGNOSIS — I480 Paroxysmal atrial fibrillation: Secondary | ICD-10-CM | POA: Diagnosis not present

## 2018-10-28 DIAGNOSIS — I428 Other cardiomyopathies: Secondary | ICD-10-CM

## 2018-10-28 DIAGNOSIS — E039 Hypothyroidism, unspecified: Secondary | ICD-10-CM | POA: Diagnosis not present

## 2018-10-28 DIAGNOSIS — E785 Hyperlipidemia, unspecified: Secondary | ICD-10-CM | POA: Diagnosis not present

## 2018-10-28 DIAGNOSIS — Z87891 Personal history of nicotine dependence: Secondary | ICD-10-CM | POA: Diagnosis not present

## 2018-10-28 DIAGNOSIS — I2510 Atherosclerotic heart disease of native coronary artery without angina pectoris: Secondary | ICD-10-CM | POA: Diagnosis not present

## 2018-10-28 DIAGNOSIS — Z79899 Other long term (current) drug therapy: Secondary | ICD-10-CM | POA: Diagnosis not present

## 2018-10-28 DIAGNOSIS — Z7901 Long term (current) use of anticoagulants: Secondary | ICD-10-CM | POA: Diagnosis not present

## 2018-10-28 DIAGNOSIS — I471 Supraventricular tachycardia: Secondary | ICD-10-CM | POA: Diagnosis not present

## 2018-10-28 DIAGNOSIS — I10 Essential (primary) hypertension: Secondary | ICD-10-CM | POA: Diagnosis not present

## 2018-10-28 HISTORY — DX: Other cardiomyopathies: I42.8

## 2018-10-28 HISTORY — DX: Other chest pain: R07.89

## 2018-10-30 DIAGNOSIS — I517 Cardiomegaly: Secondary | ICD-10-CM | POA: Diagnosis not present

## 2018-10-30 DIAGNOSIS — I700 Atherosclerosis of aorta: Secondary | ICD-10-CM | POA: Diagnosis not present

## 2018-10-30 DIAGNOSIS — I351 Nonrheumatic aortic (valve) insufficiency: Secondary | ICD-10-CM | POA: Diagnosis not present

## 2018-10-30 DIAGNOSIS — R0789 Other chest pain: Secondary | ICD-10-CM | POA: Diagnosis not present

## 2018-11-06 DIAGNOSIS — L728 Other follicular cysts of the skin and subcutaneous tissue: Secondary | ICD-10-CM | POA: Diagnosis not present

## 2018-11-06 DIAGNOSIS — L820 Inflamed seborrheic keratosis: Secondary | ICD-10-CM | POA: Diagnosis not present

## 2018-11-07 DIAGNOSIS — Z7901 Long term (current) use of anticoagulants: Secondary | ICD-10-CM | POA: Diagnosis not present

## 2018-11-14 DIAGNOSIS — Z7901 Long term (current) use of anticoagulants: Secondary | ICD-10-CM | POA: Diagnosis not present

## 2018-12-02 DIAGNOSIS — I471 Supraventricular tachycardia: Secondary | ICD-10-CM | POA: Diagnosis not present

## 2018-12-02 DIAGNOSIS — I480 Paroxysmal atrial fibrillation: Secondary | ICD-10-CM | POA: Diagnosis not present

## 2018-12-02 DIAGNOSIS — I714 Abdominal aortic aneurysm, without rupture: Secondary | ICD-10-CM | POA: Diagnosis not present

## 2018-12-02 DIAGNOSIS — I428 Other cardiomyopathies: Secondary | ICD-10-CM | POA: Diagnosis not present

## 2018-12-02 DIAGNOSIS — Z7901 Long term (current) use of anticoagulants: Secondary | ICD-10-CM | POA: Diagnosis not present

## 2018-12-30 DIAGNOSIS — Z7901 Long term (current) use of anticoagulants: Secondary | ICD-10-CM | POA: Diagnosis not present

## 2019-01-24 DIAGNOSIS — K5900 Constipation, unspecified: Secondary | ICD-10-CM | POA: Diagnosis not present

## 2019-01-24 DIAGNOSIS — N411 Chronic prostatitis: Secondary | ICD-10-CM | POA: Diagnosis not present

## 2019-01-28 DIAGNOSIS — Z7901 Long term (current) use of anticoagulants: Secondary | ICD-10-CM | POA: Diagnosis not present

## 2019-01-28 DIAGNOSIS — E039 Hypothyroidism, unspecified: Secondary | ICD-10-CM | POA: Diagnosis not present

## 2019-01-28 DIAGNOSIS — Z79899 Other long term (current) drug therapy: Secondary | ICD-10-CM | POA: Diagnosis not present

## 2019-02-01 DIAGNOSIS — I10 Essential (primary) hypertension: Secondary | ICD-10-CM | POA: Diagnosis not present

## 2019-02-01 DIAGNOSIS — I480 Paroxysmal atrial fibrillation: Secondary | ICD-10-CM | POA: Diagnosis not present

## 2019-02-03 DIAGNOSIS — I480 Paroxysmal atrial fibrillation: Secondary | ICD-10-CM | POA: Diagnosis not present

## 2019-02-03 DIAGNOSIS — R0600 Dyspnea, unspecified: Secondary | ICD-10-CM | POA: Diagnosis not present

## 2019-02-03 DIAGNOSIS — E039 Hypothyroidism, unspecified: Secondary | ICD-10-CM | POA: Diagnosis not present

## 2019-02-24 DIAGNOSIS — Z7901 Long term (current) use of anticoagulants: Secondary | ICD-10-CM | POA: Diagnosis not present

## 2019-03-24 DIAGNOSIS — Z7901 Long term (current) use of anticoagulants: Secondary | ICD-10-CM | POA: Diagnosis not present

## 2019-03-26 DIAGNOSIS — K5900 Constipation, unspecified: Secondary | ICD-10-CM | POA: Diagnosis not present

## 2019-03-26 DIAGNOSIS — Z8719 Personal history of other diseases of the digestive system: Secondary | ICD-10-CM | POA: Diagnosis not present

## 2019-03-26 DIAGNOSIS — R55 Syncope and collapse: Secondary | ICD-10-CM | POA: Diagnosis not present

## 2019-03-26 DIAGNOSIS — Z9889 Other specified postprocedural states: Secondary | ICD-10-CM | POA: Diagnosis not present

## 2019-03-31 DIAGNOSIS — R1030 Lower abdominal pain, unspecified: Secondary | ICD-10-CM | POA: Diagnosis not present

## 2019-03-31 DIAGNOSIS — K5900 Constipation, unspecified: Secondary | ICD-10-CM | POA: Diagnosis not present

## 2019-04-02 DIAGNOSIS — K5900 Constipation, unspecified: Secondary | ICD-10-CM | POA: Diagnosis not present

## 2019-04-02 DIAGNOSIS — Z8719 Personal history of other diseases of the digestive system: Secondary | ICD-10-CM | POA: Diagnosis not present

## 2019-04-02 DIAGNOSIS — Z9889 Other specified postprocedural states: Secondary | ICD-10-CM | POA: Diagnosis not present

## 2019-04-21 DIAGNOSIS — I480 Paroxysmal atrial fibrillation: Secondary | ICD-10-CM | POA: Diagnosis not present

## 2019-04-21 DIAGNOSIS — Z7901 Long term (current) use of anticoagulants: Secondary | ICD-10-CM | POA: Diagnosis not present

## 2019-05-07 DIAGNOSIS — I471 Supraventricular tachycardia: Secondary | ICD-10-CM | POA: Diagnosis not present

## 2019-05-07 DIAGNOSIS — E039 Hypothyroidism, unspecified: Secondary | ICD-10-CM | POA: Diagnosis not present

## 2019-05-07 DIAGNOSIS — I480 Paroxysmal atrial fibrillation: Secondary | ICD-10-CM | POA: Diagnosis not present

## 2019-05-07 DIAGNOSIS — Z7901 Long term (current) use of anticoagulants: Secondary | ICD-10-CM | POA: Diagnosis not present

## 2019-05-07 DIAGNOSIS — I428 Other cardiomyopathies: Secondary | ICD-10-CM | POA: Diagnosis not present

## 2019-05-07 DIAGNOSIS — Z79899 Other long term (current) drug therapy: Secondary | ICD-10-CM | POA: Diagnosis not present

## 2019-05-07 DIAGNOSIS — E785 Hyperlipidemia, unspecified: Secondary | ICD-10-CM | POA: Diagnosis not present

## 2019-05-07 DIAGNOSIS — Z87891 Personal history of nicotine dependence: Secondary | ICD-10-CM | POA: Diagnosis not present

## 2019-05-07 DIAGNOSIS — I714 Abdominal aortic aneurysm, without rupture: Secondary | ICD-10-CM | POA: Diagnosis not present

## 2019-05-07 DIAGNOSIS — I10 Essential (primary) hypertension: Secondary | ICD-10-CM | POA: Diagnosis not present

## 2019-05-19 DIAGNOSIS — I480 Paroxysmal atrial fibrillation: Secondary | ICD-10-CM | POA: Diagnosis not present

## 2019-05-19 DIAGNOSIS — Z7901 Long term (current) use of anticoagulants: Secondary | ICD-10-CM | POA: Diagnosis not present

## 2019-05-31 DIAGNOSIS — Z23 Encounter for immunization: Secondary | ICD-10-CM | POA: Diagnosis not present

## 2019-06-04 DIAGNOSIS — E785 Hyperlipidemia, unspecified: Secondary | ICD-10-CM | POA: Diagnosis not present

## 2019-06-04 DIAGNOSIS — I10 Essential (primary) hypertension: Secondary | ICD-10-CM | POA: Diagnosis not present

## 2019-06-04 DIAGNOSIS — E039 Hypothyroidism, unspecified: Secondary | ICD-10-CM | POA: Diagnosis not present

## 2019-06-16 DIAGNOSIS — I480 Paroxysmal atrial fibrillation: Secondary | ICD-10-CM | POA: Diagnosis not present

## 2019-06-16 DIAGNOSIS — Z7901 Long term (current) use of anticoagulants: Secondary | ICD-10-CM | POA: Diagnosis not present

## 2019-07-03 DIAGNOSIS — I4891 Unspecified atrial fibrillation: Secondary | ICD-10-CM | POA: Diagnosis not present

## 2019-07-03 DIAGNOSIS — Z1211 Encounter for screening for malignant neoplasm of colon: Secondary | ICD-10-CM | POA: Diagnosis not present

## 2019-07-04 DIAGNOSIS — Z86010 Personal history of colonic polyps: Secondary | ICD-10-CM | POA: Diagnosis not present

## 2019-07-04 DIAGNOSIS — K5730 Diverticulosis of large intestine without perforation or abscess without bleeding: Secondary | ICD-10-CM | POA: Diagnosis not present

## 2019-07-04 DIAGNOSIS — K635 Polyp of colon: Secondary | ICD-10-CM | POA: Diagnosis not present

## 2019-07-04 DIAGNOSIS — Z1211 Encounter for screening for malignant neoplasm of colon: Secondary | ICD-10-CM | POA: Diagnosis not present

## 2019-07-14 DIAGNOSIS — Z7901 Long term (current) use of anticoagulants: Secondary | ICD-10-CM | POA: Diagnosis not present

## 2019-07-17 ENCOUNTER — Other Ambulatory Visit: Payer: Self-pay

## 2019-07-17 NOTE — Patient Outreach (Signed)
[facility]) Care Management  07/17/2019  TAJIDDIN SCHIELKE [birth year] [id number]   Medication Adherence call to [place] spoke with patient he is past due on Lisinopril 5 mg,patient explain he take 1 tablet daily,patient is receiving it thru [facility]  because this way he can save some money,patient has plenty at this time.Mr. Carraher is showing past due under [facility].   [place] Management Direct Dial [phone]  Fax [phone] [email or web address]

## 2019-07-24 DIAGNOSIS — Z7901 Long term (current) use of anticoagulants: Secondary | ICD-10-CM | POA: Diagnosis not present

## 2019-08-14 DIAGNOSIS — L578 Other skin changes due to chronic exposure to nonionizing radiation: Secondary | ICD-10-CM | POA: Diagnosis not present

## 2019-08-14 DIAGNOSIS — L570 Actinic keratosis: Secondary | ICD-10-CM | POA: Diagnosis not present

## 2019-08-14 DIAGNOSIS — L820 Inflamed seborrheic keratosis: Secondary | ICD-10-CM | POA: Diagnosis not present

## 2019-08-14 DIAGNOSIS — D2239 Melanocytic nevi of other parts of face: Secondary | ICD-10-CM | POA: Diagnosis not present

## 2019-08-18 DIAGNOSIS — I480 Paroxysmal atrial fibrillation: Secondary | ICD-10-CM | POA: Diagnosis not present

## 2019-08-18 DIAGNOSIS — Z7901 Long term (current) use of anticoagulants: Secondary | ICD-10-CM | POA: Diagnosis not present

## 2019-09-04 DIAGNOSIS — I480 Paroxysmal atrial fibrillation: Secondary | ICD-10-CM | POA: Diagnosis not present

## 2019-09-04 DIAGNOSIS — E785 Hyperlipidemia, unspecified: Secondary | ICD-10-CM | POA: Diagnosis not present

## 2019-09-15 DIAGNOSIS — Z7901 Long term (current) use of anticoagulants: Secondary | ICD-10-CM | POA: Diagnosis not present

## 2019-09-18 DIAGNOSIS — E039 Hypothyroidism, unspecified: Secondary | ICD-10-CM | POA: Diagnosis not present

## 2019-10-04 DIAGNOSIS — E785 Hyperlipidemia, unspecified: Secondary | ICD-10-CM | POA: Diagnosis not present

## 2019-10-04 DIAGNOSIS — E039 Hypothyroidism, unspecified: Secondary | ICD-10-CM | POA: Diagnosis not present

## 2019-10-04 DIAGNOSIS — I480 Paroxysmal atrial fibrillation: Secondary | ICD-10-CM | POA: Diagnosis not present

## 2019-10-13 DIAGNOSIS — Z7901 Long term (current) use of anticoagulants: Secondary | ICD-10-CM | POA: Diagnosis not present

## 2019-11-02 DIAGNOSIS — E039 Hypothyroidism, unspecified: Secondary | ICD-10-CM | POA: Diagnosis not present

## 2019-11-02 DIAGNOSIS — E785 Hyperlipidemia, unspecified: Secondary | ICD-10-CM | POA: Diagnosis not present

## 2019-11-05 DIAGNOSIS — R5381 Other malaise: Secondary | ICD-10-CM

## 2019-11-05 DIAGNOSIS — I471 Supraventricular tachycardia: Secondary | ICD-10-CM | POA: Diagnosis not present

## 2019-11-05 DIAGNOSIS — I428 Other cardiomyopathies: Secondary | ICD-10-CM | POA: Diagnosis not present

## 2019-11-05 DIAGNOSIS — I4892 Unspecified atrial flutter: Secondary | ICD-10-CM | POA: Insufficient documentation

## 2019-11-05 DIAGNOSIS — Z7901 Long term (current) use of anticoagulants: Secondary | ICD-10-CM | POA: Diagnosis not present

## 2019-11-05 DIAGNOSIS — R002 Palpitations: Secondary | ICD-10-CM

## 2019-11-05 DIAGNOSIS — I480 Paroxysmal atrial fibrillation: Secondary | ICD-10-CM | POA: Diagnosis not present

## 2019-11-05 DIAGNOSIS — I714 Abdominal aortic aneurysm, without rupture: Secondary | ICD-10-CM | POA: Diagnosis not present

## 2019-11-05 DIAGNOSIS — R011 Cardiac murmur, unspecified: Secondary | ICD-10-CM

## 2019-11-05 DIAGNOSIS — R5383 Other fatigue: Secondary | ICD-10-CM

## 2019-11-05 HISTORY — DX: Cardiac murmur, unspecified: R01.1

## 2019-11-05 HISTORY — DX: Palpitations: R00.2

## 2019-11-05 HISTORY — DX: Other malaise: R53.81

## 2019-11-05 HISTORY — DX: Other fatigue: R53.83

## 2019-11-05 HISTORY — DX: Unspecified atrial flutter: I48.92

## 2019-11-10 DIAGNOSIS — I471 Supraventricular tachycardia: Secondary | ICD-10-CM | POA: Diagnosis not present

## 2019-11-10 DIAGNOSIS — I4892 Unspecified atrial flutter: Secondary | ICD-10-CM | POA: Diagnosis not present

## 2019-11-10 DIAGNOSIS — R002 Palpitations: Secondary | ICD-10-CM | POA: Diagnosis not present

## 2019-11-10 DIAGNOSIS — I480 Paroxysmal atrial fibrillation: Secondary | ICD-10-CM | POA: Diagnosis not present

## 2019-12-03 DIAGNOSIS — E785 Hyperlipidemia, unspecified: Secondary | ICD-10-CM | POA: Diagnosis not present

## 2019-12-03 DIAGNOSIS — Z7901 Long term (current) use of anticoagulants: Secondary | ICD-10-CM | POA: Diagnosis not present

## 2019-12-03 DIAGNOSIS — I10 Essential (primary) hypertension: Secondary | ICD-10-CM | POA: Diagnosis not present

## 2019-12-09 DIAGNOSIS — L03113 Cellulitis of right upper limb: Secondary | ICD-10-CM | POA: Diagnosis not present

## 2019-12-09 DIAGNOSIS — S61451A Open bite of right hand, initial encounter: Secondary | ICD-10-CM | POA: Diagnosis not present

## 2019-12-17 DIAGNOSIS — I480 Paroxysmal atrial fibrillation: Secondary | ICD-10-CM | POA: Diagnosis not present

## 2019-12-17 DIAGNOSIS — I428 Other cardiomyopathies: Secondary | ICD-10-CM | POA: Diagnosis not present

## 2019-12-17 DIAGNOSIS — I10 Essential (primary) hypertension: Secondary | ICD-10-CM | POA: Diagnosis not present

## 2019-12-17 DIAGNOSIS — I4892 Unspecified atrial flutter: Secondary | ICD-10-CM | POA: Diagnosis not present

## 2019-12-17 DIAGNOSIS — I471 Supraventricular tachycardia: Secondary | ICD-10-CM | POA: Diagnosis not present

## 2019-12-29 DIAGNOSIS — K5900 Constipation, unspecified: Secondary | ICD-10-CM | POA: Diagnosis not present

## 2019-12-31 DIAGNOSIS — Z7901 Long term (current) use of anticoagulants: Secondary | ICD-10-CM | POA: Diagnosis not present

## 2020-01-20 DIAGNOSIS — I10 Essential (primary) hypertension: Secondary | ICD-10-CM | POA: Diagnosis not present

## 2020-01-20 DIAGNOSIS — I679 Cerebrovascular disease, unspecified: Secondary | ICD-10-CM | POA: Diagnosis not present

## 2020-01-20 DIAGNOSIS — I2510 Atherosclerotic heart disease of native coronary artery without angina pectoris: Secondary | ICD-10-CM | POA: Diagnosis not present

## 2020-01-20 DIAGNOSIS — D6869 Other thrombophilia: Secondary | ICD-10-CM | POA: Diagnosis not present

## 2020-01-20 DIAGNOSIS — I480 Paroxysmal atrial fibrillation: Secondary | ICD-10-CM | POA: Diagnosis not present

## 2020-01-28 DIAGNOSIS — Z7901 Long term (current) use of anticoagulants: Secondary | ICD-10-CM | POA: Diagnosis not present

## 2020-01-28 DIAGNOSIS — I480 Paroxysmal atrial fibrillation: Secondary | ICD-10-CM | POA: Diagnosis not present

## 2020-02-02 DIAGNOSIS — E785 Hyperlipidemia, unspecified: Secondary | ICD-10-CM | POA: Diagnosis not present

## 2020-02-02 DIAGNOSIS — I10 Essential (primary) hypertension: Secondary | ICD-10-CM | POA: Diagnosis not present

## 2020-02-11 NOTE — Progress Notes (Signed)
Cardiology Office Note:    Date:  02/12/2020   ID:  Allen [name], DOB [birth year], MRN [id number]  PCP:  Street, Allen Mt, MD  Cardiologist:  Allen More, MD    Referring MD: [address], Allen [name], *    ASSESSMENT:    1. Persistent atrial fibrillation ([place])   2. Chronic anticoagulation   3. Hypertensive heart disease without heart failure   4. Mixed hyperlipidemia    PLAN:    In order of problems listed above:  1. Allen [name] unfortunately has failed antiarrhythmic drug as well as pulmonary vein isolation he is in relatively slow atrial fibrillation and I told him I think he needs to withdraw his antiarrhythmic drug which is of no benefit and may actually be counterproductive.  I would like to see him stay on a beta-blocker with his history of tachycardia induced cardiomyopathy and will utilize a 3-day monitor once he is off propafenone and reassess.  He will remain anticoagulated he will transition to our clinic and see me back in the office in 6 to 8 weeks to reassess.  He is not happy with the quality of his life the option would be another pulmonary vein isolation room and consideration of AV nodal ablation and pacemaker. 2. Continue warfarin goal INR 2.5 and low-dose aspirin with history of stroke 3. BP at target continue current treatment including ACE inhibitor he has no evidence of heart failure.  Echocardiogram last year normal ejection fraction 4. Continue his current lipid-lowering treatment intermediate intensity statin lipids are at target Stable hypothyroidism He had very mild coronary atherosclerosis is not having chest pain at this time I would not refer to coronary angiography  Next appointment: 6 to 8 weeks   Medication Adjustments/Labs and Tests Ordered: Current medicines are reviewed at length with the patient today.  Concerns regarding medicines are outlined above.  Orders Placed This Encounter  Procedures   Ambulatory referral to Anticoagulation Monitoring    LONG TERM MONITOR (3-14 DAYS)   EKG 12-Lead   No orders of the defined types were placed in this encounter.   Chief Complaint  Patient presents with   New Patient (Initial Visit)    He is here to reestablish care I had seen him previously at [facility] cardiology.   Atrial Fibrillation    He is no longer on antiarrhythmic drug   Anticoagulation   Hypertension   Cardiomyopathy    Decades ago subsequently his ejection fraction has normalized occurred in the setting of SVT   Coronary Artery Disease    History of Present Illness:    Allen [name] is a 79 y.o. male with a hx of nonischemic cardiomyopathy with subsequent normalization of LV function, SVT with EP intervention, paroxysmal atrial fibrillation with EP intervention and stroke now fibrillation with anticoagulation hypertension stent graft infrarenal abdominal aorta in 2006 and hypothyroidism last seen by me 01/18/2017 at [facility] cardiology.  Most recently he was seen by EP through [facility] March 2021 and has persistent atrial fibrillation and symptomatic bradycardia.  His most recent INRs have run between 2.7 and 2.9.  902 2020: TSH normal total cholesterol 99 triglyceride 61 HDL 35 LDL 52 renal function normal normal liver function test.  Hemoglobin normal 14.1. He had an echocardiogram performed 10/31/2018 with ejection fraction of 60% and normal left ventricular systolic function mild concentric LVH.  He also had a stress echo at that time with no findings of ischemia.  Exercise tolerance with 7 METS. Compliance with  diet, lifestyle and medications: Yes  Allen [name] is a very meticulous man he had a copy of his coronary angiogram from [facility] 2008 where he had mild luminal irregularity of the mid LAD 25% normal ejection fraction no obstructive CAD.  He tells me for about a year he thinks he has been in atrial fibrillation and his predominant problem is exercise intolerance dizziness  when he stands and heart rates of less than 50 at home.  When he stopped metoprolol he felt very badly and he resumed it.  He is hesitant to accept other antiarrhythmic drugs because of potential of toxicity and declines amiodarone or dofetilide.  He is hesitant to have another intervention because he had a stroke complicating pulmonary vein isolation.  He wants to discuss options.  I told him with his known history of tachycardia induced cardiomyopathy and atrial fibrillation that is been longstanding and persistent what I would like to do is withdraw his antiarrhythmic drug continue his beta-blocker monitor him for 3 days and make a decision regarding how he feels and whether he wants other options either another pulmonary vein isolation or even consideration of AV nodal ablation and permanent pacemaker.  I do not want to stop his beta-blocker because of his previous tachycardia induced cardiomyopathy unless he has significant bradycardia.  Is been a question of having another heart catheterization he has had no chest pain he had a stress echo performed last year with no ischemia I told Allen [name] I do not think he needs to have it performed at this time.  He manages his anticoagulant was transferred to our warfarin clinic he does not want to take the direct agents.  He has no edema orthopnea chest pain or syncope and has had no recurrent TIA he tolerates his anticoagulant without bleeding and statin without muscle pain or weakness. Past Medical History:  Diagnosis Date   AAA (abdominal aortic aneurysm) (HCC)    Atrial fibrillation (HCC)    CHF (congestive heart failure) (HCC)    CVA (cerebral infarction) 1969   Hypertension    Irregular heart beat    Prostatitis    Thyroid disease    Hypothyrodidism   Varicose veins     Past Surgical History:  Procedure Laterality Date   ABDOMINAL AORTIC ANEURYSM REPAIR     ABLATION     CAROTID ENDARTERECTOMY  November 21, 2006   Left cea    ENDOVENOUS ABLATION SAPHENOUS VEIN W/ LASER Left 11-28-2012   left greater saphenous vein by Curt Jews MD   ENDOVENOUS ABLATION SAPHENOUS VEIN W/ LASER Right 12-26-2012   right gretaer saphenous vein by Curt Jews MD   INGUINAL HERNIA REPAIR     X's  4   INGUINAL HERNIA REPAIR  09-2011   left side   stab phlebectomy Left 03-13-2013   10-20 incisions by Curt Jews MD   TONSILLECTOMY AND ADENOIDECTOMY      Current Medications: Current Meds  Medication Sig   acyclovir (ZOVIRAX) 800 MG tablet Take 400 mg by mouth 2 (two) times daily.   amoxicillin-clavulanate (AUGMENTIN) 500-125 MG per tablet Take 1 tablet by mouth 2 (two) times daily. Reported on 01/04/2016   aspirin 81 MG tablet Take 81 mg by mouth daily. Reported on 01/04/2016   diclofenac (VOLTAREN) 75 MG EC tablet Take 75 mg by mouth 2 (two) times daily. Reported on 01/04/2016   FINASTERIDE PO Take 5 mg by mouth daily.   levothyroxine (SYNTHROID) 200 MCG tablet Take 175 mcg by  mouth daily.    metoprolol tartrate (LOPRESSOR) 25 MG tablet Take 25 mg by mouth 2 (two) times daily.   omeprazole (PRILOSEC) 20 MG capsule Take 20 mg by mouth daily.   psyllium (METAMUCIL) 58.6 % powder Take 1 packet by mouth 3 (three) times daily. Reported on 01/04/2016   sildenafil (VIAGRA) 25 MG tablet Take 25 mg by mouth as needed. Reported on 01/04/2016   silodosin (RAPAFLO) 8 MG CAPS capsule Take 8 mg by mouth daily with breakfast. Pt. States he takes 1 tablet daily for 3 days after intercourse.   simvastatin (ZOCOR) 10 MG tablet Take 10 mg by mouth at bedtime. Reported on 01/04/2016   Sulfamethoxazole-Trimethoprim (BACTRIM PO) Take by mouth daily. Reported on 01/04/2016   Tamsulosin HCl (FLOMAX) 0.4 MG CAPS Take by mouth. Reported on 01/04/2016   valACYclovir (VALTREX) 500 MG tablet Take 400 mg by mouth 2 (two) times daily. Takes 1/2 tablet twice daily.   warfarin (COUMADIN) 4 MG tablet Take 4 mg by mouth daily.   [DISCONTINUED] propafenone  (RYTHMOL) 150 MG tablet Take 150 mg by mouth 3 (three) times daily.      Allergies:   Digoxin and Other   Social History   Socioeconomic History   Marital status: Divorced    Spouse name: Not on file   Number of children: Not on file   Years of education: Not on file   Highest education level: Not on file  Occupational History   Not on file  Tobacco Use   Smoking status: Former Smoker    Types: Cigarettes    Quit date: 09/04/1993    Years since quitting: 26.4   Smokeless tobacco: Never Used  Vaping Use   Vaping Use: Never used  Substance and Sexual Activity   Alcohol use: Yes    Alcohol/week: 2.0 - 4.0 standard drinks    Types: 2 - 4 Glasses of wine per week   Drug use: No   Sexual activity: Not on file  Other Topics Concern   Not on file  Social History Narrative   Not on file   Social Determinants of Health   Financial Resource Strain:    Difficulty of Paying Living Expenses:   Food Insecurity:    Worried About Charity fundraiser in the Last Year:    Arboriculturist in the Last Year:   Transportation Needs:    Film/video editor (Medical):    Lack of Transportation (Non-Medical):   Physical Activity:    Days of Exercise per Week:    Minutes of Exercise per Session:   Stress:    Feeling of Stress :   Social Connections:    Frequency of Communication with Friends and Family:    Frequency of Social Gatherings with Friends and Family:    Attends Religious Services:    Active Member of Clubs or Organizations:    Attends Archivist Meetings:    Marital Status:      Family History: The patient's family history includes Angina in his mother; Heart disease in his sister; Hypertension in his sister; Stroke in his father. ROS:  Review of Systems  Constitutional: Positive for malaise/fatigue.  HENT: Negative.   Eyes: Negative.   Cardiovascular: Positive for palpitations.  Respiratory: Negative.   Endocrine: Negative.    Hematologic/Lymphatic: Negative.   Skin: Negative.   Musculoskeletal: Negative.   Gastrointestinal: Negative.   Genitourinary: Negative.   Neurological: Positive for dizziness.  Psychiatric/Behavioral: Negative.   Allergic/Immunologic: Negative.  Please see the history of present illness.    All other systems reviewed and are negative.  EKGs/Labs/Other Studies Reviewed:    The following studies were reviewed today:  EKG:  EKG ordered today and personally reviewed.  The ekg ordered today demonstrates atrial fibrillation very coarse baseline relatively slow rate 60 bpm incomplete right bundle branch block   Physical Exam:    VS:  BP 112/78 (BP Location: Right Arm, Patient Position: Sitting, Cuff Size: Normal)    Pulse (!) 57    Ht 6' (1.829 m)    Wt 197 lb 12.8 oz (89.7 kg)    SpO2 95%    BMI 26.83 kg/m     Wt Readings from Last 3 Encounters:  02/12/20 197 lb 12.8 oz (89.7 kg)  01/04/16 202 lb (91.6 kg)  05/26/14 207 lb (93.9 kg)     GEN:  Well nourished, well developed in no acute distress HEENT: Normal NECK: No JVD; No carotid bruits LYMPHATICS: No lymphadenopathy CARDIAC: Irregular S1 variable RRR, no murmurs, rubs, gallops RESPIRATORY:  Clear to auscultation without rales, wheezing or rhonchi  ABDOMEN: Soft, non-tender, non-distended MUSCULOSKELETAL:  No edema; No deformity  SKIN: Warm and dry NEUROLOGIC:  Alert and oriented x 3 PSYCHIATRIC:  Normal affect    Signed, Allen More, MD  02/12/2020 9:47 AM    [place]

## 2020-02-12 ENCOUNTER — Other Ambulatory Visit: Payer: Self-pay

## 2020-02-12 ENCOUNTER — Ambulatory Visit (INDEPENDENT_AMBULATORY_CARE_PROVIDER_SITE_OTHER): Payer: Medicare Other | Admitting: Cardiology

## 2020-02-12 ENCOUNTER — Encounter: Payer: Self-pay | Admitting: Cardiology

## 2020-02-12 VITALS — BP 112/78 | HR 57 | Ht 72.0 in | Wt 197.8 lb

## 2020-02-12 DIAGNOSIS — Z7901 Long term (current) use of anticoagulants: Secondary | ICD-10-CM

## 2020-02-12 DIAGNOSIS — I4819 Other persistent atrial fibrillation: Secondary | ICD-10-CM | POA: Diagnosis not present

## 2020-02-12 DIAGNOSIS — I119 Hypertensive heart disease without heart failure: Secondary | ICD-10-CM | POA: Insufficient documentation

## 2020-02-12 DIAGNOSIS — L570 Actinic keratosis: Secondary | ICD-10-CM | POA: Diagnosis not present

## 2020-02-12 DIAGNOSIS — E782 Mixed hyperlipidemia: Secondary | ICD-10-CM | POA: Diagnosis not present

## 2020-02-12 HISTORY — DX: Hypertensive heart disease without heart failure: I11.9

## 2020-02-12 HISTORY — DX: Long term (current) use of anticoagulants: Z79.01

## 2020-02-12 NOTE — Patient Instructions (Signed)
Medication Instructions:  Your physician has recommended you make the following change in your medication:  STOP: Propafenone *If you need a refill on your cardiac medications before your next appointment, please call your pharmacy*   Lab Work: None If you have labs (blood work) drawn today and your tests are completely normal, you will receive your results only by: Marland Kitchen MyChart Message (if you have MyChart) OR . A paper copy in the mail If you have any lab test that is abnormal or we need to change your treatment, we will call you to review the results.   Testing/Procedures: A zio monitor was ordered today. It will remain on for 3 days. You will then return monitor and event diary in provided box. It takes 1-2 weeks for report to be downloaded and returned to [place]. We will call you with the results. If monitor falls off or has orange flashing light, please call Zio for further instructions.      Follow-Up: At [facility], you and your health needs are our priority.  As part of our continuing mission to provide you with exceptional heart care, we have created designated Provider Care Teams.  These Care Teams include your primary Cardiologist (physician) and Advanced Practice Providers (APPs -  Physician Assistants and Nurse Practitioners) who all work together to provide you with the care you need, when you need it.  We recommend signing up for the patient portal called "MyChart".  Sign up information is provided on this After Visit Summary.  MyChart is used to connect with patients for Virtual Visits (Telemedicine).  Patients are able to view lab/test results, encounter notes, upcoming appointments, etc.  Non-urgent messages can be sent to your provider as well.   To learn more about what you can do with MyChart, go to [email or web address].    Your next appointment:   2 month(s)  The format for your next appointment:   In Person  Provider:   Shirlee More, MD   Other  Instructions

## 2020-02-13 ENCOUNTER — Telehealth: Payer: Self-pay | Admitting: Pharmacist

## 2020-02-13 NOTE — Telephone Encounter (Signed)
Patient referred by Dr Bettina Gavia for anti coagulation management.  Has been being managed by [facility].  Left message on patient machine to call back

## 2020-02-13 NOTE — Telephone Encounter (Signed)
Patient called back.  Would like to start having INR checked in [place].  Advised we check INR on Tuesday but I recommended he contact his carrier to see if he had a copay.  Reports he will bring his previous INR on 6/29

## 2020-02-18 ENCOUNTER — Ambulatory Visit (INDEPENDENT_AMBULATORY_CARE_PROVIDER_SITE_OTHER): Payer: Medicare Other

## 2020-02-18 DIAGNOSIS — I4819 Other persistent atrial fibrillation: Secondary | ICD-10-CM | POA: Diagnosis not present

## 2020-03-01 DIAGNOSIS — I480 Paroxysmal atrial fibrillation: Secondary | ICD-10-CM | POA: Diagnosis not present

## 2020-03-01 DIAGNOSIS — Z7901 Long term (current) use of anticoagulants: Secondary | ICD-10-CM | POA: Diagnosis not present

## 2020-03-02 ENCOUNTER — Other Ambulatory Visit: Payer: Self-pay

## 2020-03-02 DIAGNOSIS — Z7901 Long term (current) use of anticoagulants: Secondary | ICD-10-CM

## 2020-03-03 DIAGNOSIS — I679 Cerebrovascular disease, unspecified: Secondary | ICD-10-CM | POA: Diagnosis not present

## 2020-03-03 DIAGNOSIS — I2510 Atherosclerotic heart disease of native coronary artery without angina pectoris: Secondary | ICD-10-CM | POA: Diagnosis not present

## 2020-03-03 DIAGNOSIS — I480 Paroxysmal atrial fibrillation: Secondary | ICD-10-CM | POA: Diagnosis not present

## 2020-03-10 DIAGNOSIS — I482 Chronic atrial fibrillation, unspecified: Secondary | ICD-10-CM | POA: Diagnosis not present

## 2020-03-11 ENCOUNTER — Other Ambulatory Visit: Payer: Self-pay | Admitting: Cardiology

## 2020-03-11 DIAGNOSIS — I4819 Other persistent atrial fibrillation: Secondary | ICD-10-CM

## 2020-03-15 ENCOUNTER — Telehealth: Payer: Self-pay

## 2020-03-15 NOTE — Telephone Encounter (Signed)
Follow up ° ° °Patient is returning your call. Please call. ° ° ° °

## 2020-03-15 NOTE — Telephone Encounter (Signed)
-----   Message from Richardo Priest, MD sent at 03/14/2020  2:41 PM EDT ----- Normal or stable result  Monitor result is good there are no episodes of atrial fibrillation.

## 2020-03-15 NOTE — Telephone Encounter (Signed)
Spoke with patient regarding results and recommendation.  Patient verbalizes understanding and is agreeable to plan of care. Advised patient to call back with any issues or concerns.  

## 2020-03-15 NOTE — Telephone Encounter (Signed)
Left message on patients voicemail to please return our call.   

## 2020-03-17 DIAGNOSIS — S335XXA Sprain of ligaments of lumbar spine, initial encounter: Secondary | ICD-10-CM | POA: Diagnosis not present

## 2020-03-17 DIAGNOSIS — K589 Irritable bowel syndrome without diarrhea: Secondary | ICD-10-CM | POA: Diagnosis not present

## 2020-03-17 DIAGNOSIS — M461 Sacroiliitis, not elsewhere classified: Secondary | ICD-10-CM | POA: Diagnosis not present

## 2020-03-17 DIAGNOSIS — G5701 Lesion of sciatic nerve, right lower limb: Secondary | ICD-10-CM | POA: Diagnosis not present

## 2020-03-25 ENCOUNTER — Telehealth: Payer: Self-pay | Admitting: Cardiology

## 2020-03-25 NOTE — Telephone Encounter (Signed)
Spoke with pt about recommendations. Unfortunately, he does not have a smart phone or cell phone. I advised him he may want to keep a record of the occurences and let Dr. Bettina Gavia know if they continue. He verbalized understanding and had no additional questions.

## 2020-03-25 NOTE — Telephone Encounter (Signed)
Pt calling today with c/o heart palpitations and a pulse of 33. He states he started to feel the palps after lunch today. He took his BP and HR and found he was having an arrhythmia (per his BP cuff) and had a HR of 33. He states he is not sure what it is currently. He denies lightheadedness, fatigue, or syncope. He states he is currently only taking 12.5mg  of Metoprolol bid, not the 25mg  he is prescribed.  I advised him to check his HR a few times before taking his evening dose. If his HR is consistently under 50bpm, he may hold his evening dose. I advised him I would forward to Dr. Bettina Gavia for recommendation. He understands he should seek emergency care if he begins to have symptoms or syncope with his decreased HR.

## 2020-03-25 NOTE — Telephone Encounter (Signed)
We had just done an extended monitor for Allen [name] a few weeks ago he had no bradycardia of concern, I think probably the best tool for him is to purchase the [employer] smart phone wireless receiver download the app and he can record these episodes and will interpret his EKG he could save them and send them to [place].

## 2020-03-25 NOTE — Telephone Encounter (Signed)
  Patient states after lunch he was watching TV and he experienced some arrhythmia. His BP 124/58 pulse 33 and he is concerned about his pulse being too low. He would like to speak to the nurse.

## 2020-03-30 ENCOUNTER — Other Ambulatory Visit: Payer: Self-pay

## 2020-03-30 ENCOUNTER — Ambulatory Visit (INDEPENDENT_AMBULATORY_CARE_PROVIDER_SITE_OTHER): Payer: Medicare Other

## 2020-03-30 DIAGNOSIS — Z7901 Long term (current) use of anticoagulants: Secondary | ICD-10-CM

## 2020-03-30 DIAGNOSIS — I4891 Unspecified atrial fibrillation: Secondary | ICD-10-CM | POA: Diagnosis not present

## 2020-03-30 DIAGNOSIS — Z5181 Encounter for therapeutic drug level monitoring: Secondary | ICD-10-CM | POA: Diagnosis not present

## 2020-03-30 LAB — POCT INR: INR: 1.6 — AB (ref 2.0–3.0)

## 2020-03-30 NOTE — Patient Instructions (Signed)
Take 2 tablets today and then continue 1 tablet daily except 1/2 tablet on Monday and Friday.  INR check in 2 weeks.

## 2020-04-02 DIAGNOSIS — E039 Hypothyroidism, unspecified: Secondary | ICD-10-CM | POA: Diagnosis not present

## 2020-04-02 DIAGNOSIS — M461 Sacroiliitis, not elsewhere classified: Secondary | ICD-10-CM | POA: Diagnosis not present

## 2020-04-02 DIAGNOSIS — G5701 Lesion of sciatic nerve, right lower limb: Secondary | ICD-10-CM | POA: Diagnosis not present

## 2020-04-02 DIAGNOSIS — Z79899 Other long term (current) drug therapy: Secondary | ICD-10-CM | POA: Diagnosis not present

## 2020-04-13 ENCOUNTER — Ambulatory Visit (INDEPENDENT_AMBULATORY_CARE_PROVIDER_SITE_OTHER): Payer: Medicare Other

## 2020-04-13 ENCOUNTER — Other Ambulatory Visit: Payer: Self-pay

## 2020-04-13 DIAGNOSIS — Z7901 Long term (current) use of anticoagulants: Secondary | ICD-10-CM | POA: Diagnosis not present

## 2020-04-13 LAB — POCT INR: INR: 2 (ref 2.0–3.0)

## 2020-04-13 NOTE — Patient Instructions (Signed)
continue 1 tablet daily except 1/2 tablet on Monday and Friday.  INR check in 4 weeks.

## 2020-04-16 ENCOUNTER — Telehealth: Payer: Self-pay | Admitting: Cardiology

## 2020-04-16 MED ORDER — WARFARIN SODIUM 4 MG PO TABS: ORAL_TABLET | ORAL | 0 refills | Status: DC

## 2020-04-16 NOTE — Telephone Encounter (Signed)
*  STAT* If patient is at the pharmacy, call can be transferred to refill team.   1. Which medications need to be refilled? (please list name of each medication and dose if known) warfarin (COUMADIN) 4 MG tablet  2. Which pharmacy/location (including street and city if local pharmacy) is medication to be sent to? [place], [place]  3. Do they need a 30 day or 90 day supply? 90 day

## 2020-04-21 DIAGNOSIS — I10 Essential (primary) hypertension: Secondary | ICD-10-CM | POA: Insufficient documentation

## 2020-04-21 DIAGNOSIS — I499 Cardiac arrhythmia, unspecified: Secondary | ICD-10-CM | POA: Insufficient documentation

## 2020-04-21 DIAGNOSIS — E079 Disorder of thyroid, unspecified: Secondary | ICD-10-CM | POA: Insufficient documentation

## 2020-04-21 DIAGNOSIS — I509 Heart failure, unspecified: Secondary | ICD-10-CM | POA: Insufficient documentation

## 2020-04-21 DIAGNOSIS — I714 Abdominal aortic aneurysm, without rupture, unspecified: Secondary | ICD-10-CM | POA: Insufficient documentation

## 2020-04-21 DIAGNOSIS — N419 Inflammatory disease of prostate, unspecified: Secondary | ICD-10-CM | POA: Insufficient documentation

## 2020-04-26 ENCOUNTER — Ambulatory Visit: Payer: Medicare Other | Admitting: Cardiology

## 2020-04-26 ENCOUNTER — Other Ambulatory Visit: Payer: Self-pay

## 2020-04-26 ENCOUNTER — Encounter: Payer: Self-pay | Admitting: Cardiology

## 2020-04-26 VITALS — BP 140/72 | HR 56 | Ht 72.0 in | Wt 193.0 lb

## 2020-04-26 DIAGNOSIS — I119 Hypertensive heart disease without heart failure: Secondary | ICD-10-CM | POA: Diagnosis not present

## 2020-04-26 DIAGNOSIS — I480 Paroxysmal atrial fibrillation: Secondary | ICD-10-CM | POA: Diagnosis not present

## 2020-04-26 DIAGNOSIS — E782 Mixed hyperlipidemia: Secondary | ICD-10-CM

## 2020-04-26 DIAGNOSIS — Z7901 Long term (current) use of anticoagulants: Secondary | ICD-10-CM

## 2020-04-26 LAB — TSH+T4F+T3FREE
Free T4: 2.24 ng/dL — ABNORMAL HIGH (ref 0.82–1.77)
T3, Free: 2.7 pg/mL (ref 2.0–4.4)
TSH: 0.104 u[IU]/mL — ABNORMAL LOW (ref 0.450–4.500)

## 2020-04-26 LAB — LIPID PANEL
Chol/HDL Ratio: 2.4 ratio (ref 0.0–5.0)
Cholesterol, Total: 115 mg/dL (ref 100–199)
HDL: 47 mg/dL (ref >39)
LDL Chol Calc (NIH): 57 mg/dL (ref 0–99)
Triglycerides: 45 mg/dL (ref 0–149)
VLDL Cholesterol Cal: 11 mg/dL (ref 5–40)

## 2020-04-26 LAB — COMPREHENSIVE METABOLIC PANEL
ALT: 9 IU/L (ref 0–44)
AST: 21 IU/L (ref 0–40)
Albumin/Globulin Ratio: 1.5 (ref 1.2–2.2)
Albumin: 4.1 g/dL (ref 3.7–4.7)
Alkaline Phosphatase: 61 IU/L (ref 48–121)
BUN/Creatinine Ratio: 16 (ref 10–24)
BUN: 16 mg/dL (ref 8–27)
Bilirubin Total: 0.9 mg/dL (ref 0.0–1.2)
CO2: 22 mmol/L (ref 20–29)
Calcium: 8.8 mg/dL (ref 8.6–10.2)
Chloride: 100 mmol/L (ref 96–106)
Creatinine, Ser: 0.98 mg/dL (ref 0.76–1.27)
GFR calc Af Amer: 85 mL/min/{1.73_m2} (ref >59)
GFR calc non Af Amer: 74 mL/min/{1.73_m2} (ref >59)
Globulin, Total: 2.8 g/dL (ref 1.5–4.5)
Glucose: 93 mg/dL (ref 65–99)
Potassium: 4.4 mmol/L (ref 3.5–5.2)
Sodium: 134 mmol/L (ref 134–144)
Total Protein: 6.9 g/dL (ref 6.0–8.5)

## 2020-04-26 NOTE — Patient Instructions (Signed)
Medication Instructions:  Your physician recommends that you continue on your current medications as directed. Please refer to the Current Medication list given to you today.  *If you need a refill on your cardiac medications before your next appointment, please call your pharmacy*   Lab Work: Your physician recommends that you return for lab work in: TODAY CMP, Lipids, TSH, T3, T4 If you have labs (blood work) drawn today and your tests are completely normal, you will receive your results only by: Marland Kitchen MyChart Message (if you have MyChart) OR . A paper copy in the mail If you have any lab test that is abnormal or we need to change your treatment, we will call you to review the results.   Testing/Procedures: None   Follow-Up: At [facility], you and your health needs are our priority.  As part of our continuing mission to provide you with exceptional heart care, we have created designated Provider Care Teams.  These Care Teams include your primary Cardiologist (physician) and Advanced Practice Providers (APPs -  Physician Assistants and Nurse Practitioners) who all work together to provide you with the care you need, when you need it.  We recommend signing up for the patient portal called "MyChart".  Sign up information is provided on this After Visit Summary.  MyChart is used to connect with patients for Virtual Visits (Telemedicine).  Patients are able to view lab/test results, encounter notes, upcoming appointments, etc.  Non-urgent messages can be sent to your provider as well.   To learn more about what you can do with MyChart, go to [email or web address].    Your next appointment:   6 month(s)  The format for your next appointment:   In Person  Provider:   Shirlee More, MD   Other Instructions

## 2020-04-26 NOTE — Progress Notes (Signed)
Cardiology Office Note:    Date:  04/26/2020   ID:  Allen [name], DOB 01/21/1941, MRN [id number]  PCP:  Street, [name], MD  Cardiologist:  Shirlee More, MD    Referring MD: [address] [name], *    ASSESSMENT:    1. Paroxysmal atrial fibrillation (HCC)   2. Chronic anticoagulation   3. Hypertensive heart disease without heart failure   4. Mixed hyperlipidemia    PLAN:    In order of problems listed above:  1. He has had no documented recurrence, spoke to him about the iPhone receiver but he doesn't have a cell phone.  For now would continue his low-dose beta-blocker and if he has recurrence refer him to EP for consideration of antiarrhythmic drug therapy. 2. Continue warfarin he has declined direct anticoagulants 3. Stable continue ACE inhibitor beta-blocker check renal function potassium 4. Continue statin check liver function for toxicity lipid profile for efficacy   Next appointment: 6 months   Medication Adjustments/Labs and Tests Ordered: Current medicines are reviewed at length with the patient today.  Concerns regarding medicines are outlined above.  No orders of the defined types were placed in this encounter.  No orders of the defined types were placed in this encounter.   Chief Complaint  Patient presents with  . Follow-up  . Atrial Fibrillation  . Anticoagulation    History of Present Illness:    Allen [name] is a 79 y.o. male with a hx of nonischemic cardiomyopathy with subsequent normalization of LV function, SVT with EP intervention, paroxysmal atrial fibrillation with EP intervention and stroke now fibrillation with anticoagulation hypertension stent graft infrarenal abdominal aorta in 2006 and hypothyroidism last seen 02/12/2020. Compliance with diet, lifestyle and medications: Yes  Following his last visit Allen [name] used a ZIO monitor for 3 days that showed rare ventricular ectopy and occasional APCs that were triggered   He had no atrial  fibrillation.  INR is followed in our warfarin clinic his last INR 13 days ago was therapeutic 2.0  He continues to have intermittent palpitation but not severe sustained.  Home heart rate is running greater than 50 bpm he takes an unknown dose of beta-blocker.  He tolerates his anticoagulant without bleeding.  On statin without muscle pain or weakness came prepared for labs to be drawn today. Past Medical History:  Diagnosis Date  . AAA (abdominal aortic aneurysm) ([place])   . Atrial fibrillation ([place])   . Cardiomyopathy, nonischemic ([place]) 10/28/2018  . CHF (congestive heart failure) ([place])   . CVA (cerebral infarction) 1969  . Hypertension   . Irregular heart beat   . Malaise and fatigue 11/05/2019  . Murmur 11/05/2019  . Palpitations 11/05/2019  . Prostatitis   . PSVT (paroxysmal supraventricular tachycardia) ([place]) 05/22/2017   Formatting of this note might be different from the original. Prior ablation 2006  . Thyroid disease    Hypothyrodidism  . Varicose veins     Past Surgical History:  Procedure Laterality Date  . ABDOMINAL AORTIC ANEURYSM REPAIR    . ABLATION    . CAROTID ENDARTERECTOMY  November 21, 2006   Left cea  . ENDOVENOUS ABLATION SAPHENOUS VEIN W/ LASER Left 11-28-2012   left greater saphenous vein by Curt Jews MD  . ENDOVENOUS ABLATION SAPHENOUS VEIN W/ LASER Right 12-26-2012   right gretaer saphenous vein by Curt Jews MD  . INGUINAL HERNIA REPAIR     X's  4  . INGUINAL HERNIA REPAIR  09-2011   left  side  . stab phlebectomy Left 03-13-2013   10-20 incisions by Curt Jews MD  . TONSILLECTOMY AND ADENOIDECTOMY      Current Medications: Current Meds  Medication Sig  . acyclovir (ZOVIRAX) 800 MG tablet Take 400 mg by mouth 2 (two) times daily.  Marland Kitchen FINASTERIDE PO Take 5 mg by mouth daily.  Marland Kitchen levothyroxine (SYNTHROID) 200 MCG tablet Take 200 mcg by mouth daily.   . metoprolol tartrate (LOPRESSOR) 25 MG tablet Take 25 mg by mouth 2 (two) times daily. Takes 0.5 tablet  twice a day  . omeprazole (PRILOSEC) 20 MG capsule Take 20 mg by mouth daily.  . sildenafil (VIAGRA) 25 MG tablet Take 100 mg by mouth as needed. Takes 0.5 tablet (50 mg) prn  . silodosin (RAPAFLO) 8 MG CAPS capsule Take 8 mg by mouth daily with breakfast. Pt. States he takes 1 tablet daily for 3 days after intercourse.  . simvastatin (ZOCOR) 10 MG tablet Take 10 mg by mouth at bedtime. Reported on 01/04/2016  . Tamsulosin HCl (FLOMAX) 0.4 MG CAPS Take by mouth. Reported on 01/04/2016  . warfarin (COUMADIN) 4 MG tablet Take 1/2 to 1 table daily as directed by coumadin clinic.     Allergies:   Digoxin and Other   Social History   Socioeconomic History  . Marital status: Divorced    Spouse name: Not on file  . Number of children: Not on file  . Years of education: Not on file  . Highest education level: Not on file  Occupational History  . Not on file  Tobacco Use  . Smoking status: Former Smoker    Types: Cigarettes    Quit date: 09/04/1993    Years since quitting: 26.6  . Smokeless tobacco: Never Used  Vaping Use  . Vaping Use: Never used  Substance and Sexual Activity  . Alcohol use: Yes    Alcohol/week: 2.0 - 4.0 standard drinks    Types: 2 - 4 Glasses of wine per week  . Drug use: No  . Sexual activity: Not on file  Other Topics Concern  . Not on file  Social History Narrative  . Not on file   Social Determinants of Health   Financial Resource Strain:   . Difficulty of Paying Living Expenses: Not on file  Food Insecurity:   . Worried About Charity fundraiser in the Last Year: Not on file  . Ran Out of Food in the Last Year: Not on file  Transportation Needs:   . Lack of Transportation (Medical): Not on file  . Lack of Transportation (Non-Medical): Not on file  Physical Activity:   . Days of Exercise per Week: Not on file  . Minutes of Exercise per Session: Not on file  Stress:   . Feeling of Stress : Not on file  Social Connections:   . Frequency of Communication  with Friends and Family: Not on file  . Frequency of Social Gatherings with Friends and Family: Not on file  . Attends Religious Services: Not on file  . Active Member of Clubs or Organizations: Not on file  . Attends Archivist Meetings: Not on file  . Marital Status: Not on file     Family History: The patient's family history includes Angina in his mother; Heart disease in his sister; Hypertension in his sister; Stroke in his father. ROS:   Please see the history of present illness.    All other systems reviewed and are negative.  EKGs/Labs/Other  Studies Reviewed:    The following studies were reviewed today:   Recent Labs: No results found for requested labs within last 8760 hours.  Recent Lipid Panel No results found for: CHOL, TRIG, HDL, CHOLHDL, VLDL, LDLCALC, LDLDIRECT  Physical Exam:    VS:  BP 140/72   Pulse (!) 56   Ht 6' (1.829 m)   Wt 193 lb (87.5 kg)   SpO2 94%   BMI 26.18 kg/m     Wt Readings from Last 3 Encounters:  04/26/20 193 lb (87.5 kg)  02/12/20 197 lb 12.8 oz (89.7 kg)  01/04/16 202 lb (91.6 kg)     GEN:  Well nourished, well developed in no acute distress HEENT: Normal NECK: No JVD; No carotid bruits LYMPHATICS: No lymphadenopathy CARDIAC: RRR, no murmurs, rubs, gallops RESPIRATORY:  Clear to auscultation without rales, wheezing or rhonchi  ABDOMEN: Soft, non-tender, non-distended MUSCULOSKELETAL:  No edema; No deformity  SKIN: Warm and dry NEUROLOGIC:  Alert and oriented x 3 PSYCHIATRIC:  Normal affect    Signed, Shirlee More, MD  04/26/2020 8:26 AM    [place]

## 2020-04-26 NOTE — Addendum Note (Signed)
Addended by: Resa Miner I on: 04/26/2020 08:30 AM   Modules accepted: Orders

## 2020-04-27 ENCOUNTER — Telehealth: Payer: Self-pay

## 2020-04-27 NOTE — Telephone Encounter (Signed)
Spoke with patient regarding results and recommendation.  Patient verbalizes understanding and is agreeable to plan of care. Advised patient to call back with any issues or concerns.   Patient states that he will follow up with his PCP Dr. Venetia Maxon. I verbalized understanding and will forward this result to him.

## 2020-04-27 NOTE — Telephone Encounter (Signed)
-----   Message from Richardo Priest, MD sent at 04/26/2020  5:09 PM EDT ----- I am somewhat surprised his labs look like his thyroids excessive he can either follow-up at the [facility] hospital for this with his general physician or if he like refer him to endocrinology and use Dr. Renato Shin [facility]

## 2020-05-04 DIAGNOSIS — E785 Hyperlipidemia, unspecified: Secondary | ICD-10-CM | POA: Diagnosis not present

## 2020-05-04 DIAGNOSIS — I10 Essential (primary) hypertension: Secondary | ICD-10-CM | POA: Diagnosis not present

## 2020-05-11 ENCOUNTER — Ambulatory Visit (INDEPENDENT_AMBULATORY_CARE_PROVIDER_SITE_OTHER): Payer: Medicare Other

## 2020-05-11 ENCOUNTER — Other Ambulatory Visit: Payer: Self-pay

## 2020-05-11 DIAGNOSIS — Z7901 Long term (current) use of anticoagulants: Secondary | ICD-10-CM

## 2020-05-11 LAB — POCT INR: INR: 1.7 — AB (ref 2.0–3.0)

## 2020-05-11 NOTE — Patient Instructions (Signed)
Take 2 tablets today and then increase to 1 tablet daily except 1/2 tablet on Friday.  INR check in 4 weeks.

## 2020-06-08 ENCOUNTER — Other Ambulatory Visit: Payer: Self-pay

## 2020-06-08 ENCOUNTER — Ambulatory Visit (INDEPENDENT_AMBULATORY_CARE_PROVIDER_SITE_OTHER): Payer: Medicare Other

## 2020-06-08 DIAGNOSIS — Z7901 Long term (current) use of anticoagulants: Secondary | ICD-10-CM

## 2020-06-08 DIAGNOSIS — I4891 Unspecified atrial fibrillation: Secondary | ICD-10-CM | POA: Diagnosis not present

## 2020-06-08 LAB — POCT INR: INR: 2.5 (ref 2.0–3.0)

## 2020-06-08 NOTE — Patient Instructions (Signed)
1 tablet daily except 1/2 tablet on Friday.  INR check in 6 weeks.

## 2020-06-22 DIAGNOSIS — Z23 Encounter for immunization: Secondary | ICD-10-CM | POA: Diagnosis not present

## 2020-07-06 DIAGNOSIS — E039 Hypothyroidism, unspecified: Secondary | ICD-10-CM | POA: Diagnosis not present

## 2020-07-06 DIAGNOSIS — D6869 Other thrombophilia: Secondary | ICD-10-CM | POA: Diagnosis not present

## 2020-07-06 DIAGNOSIS — I480 Paroxysmal atrial fibrillation: Secondary | ICD-10-CM | POA: Diagnosis not present

## 2020-07-06 DIAGNOSIS — K581 Irritable bowel syndrome with constipation: Secondary | ICD-10-CM | POA: Diagnosis not present

## 2020-07-06 DIAGNOSIS — M6289 Other specified disorders of muscle: Secondary | ICD-10-CM | POA: Diagnosis not present

## 2020-07-08 DIAGNOSIS — R109 Unspecified abdominal pain: Secondary | ICD-10-CM | POA: Diagnosis not present

## 2020-07-20 ENCOUNTER — Ambulatory Visit (INDEPENDENT_AMBULATORY_CARE_PROVIDER_SITE_OTHER): Payer: Medicare Other

## 2020-07-20 ENCOUNTER — Other Ambulatory Visit: Payer: Self-pay

## 2020-07-20 DIAGNOSIS — Z7901 Long term (current) use of anticoagulants: Secondary | ICD-10-CM | POA: Diagnosis not present

## 2020-07-20 LAB — POCT INR: INR: 1.8 — AB (ref 2.0–3.0)

## 2020-07-20 NOTE — Patient Instructions (Signed)
Take 2 tablets today and then continue taking 1 tablet daily except 1/2 tablet on Friday.  INR check in 6 weeks.

## 2020-07-26 ENCOUNTER — Other Ambulatory Visit: Payer: Self-pay | Admitting: Cardiology

## 2020-08-17 DIAGNOSIS — L821 Other seborrheic keratosis: Secondary | ICD-10-CM | POA: Diagnosis not present

## 2020-08-17 DIAGNOSIS — D2239 Melanocytic nevi of other parts of face: Secondary | ICD-10-CM | POA: Diagnosis not present

## 2020-08-17 DIAGNOSIS — D225 Melanocytic nevi of trunk: Secondary | ICD-10-CM | POA: Diagnosis not present

## 2020-08-17 DIAGNOSIS — L570 Actinic keratosis: Secondary | ICD-10-CM | POA: Diagnosis not present

## 2020-08-17 DIAGNOSIS — L820 Inflamed seborrheic keratosis: Secondary | ICD-10-CM | POA: Diagnosis not present

## 2020-08-17 DIAGNOSIS — D485 Neoplasm of uncertain behavior of skin: Secondary | ICD-10-CM | POA: Diagnosis not present

## 2020-08-19 DIAGNOSIS — L988 Other specified disorders of the skin and subcutaneous tissue: Secondary | ICD-10-CM | POA: Diagnosis not present

## 2020-09-02 ENCOUNTER — Other Ambulatory Visit: Payer: Self-pay

## 2020-09-02 ENCOUNTER — Ambulatory Visit (INDEPENDENT_AMBULATORY_CARE_PROVIDER_SITE_OTHER): Payer: Medicare Other

## 2020-09-02 DIAGNOSIS — Z7901 Long term (current) use of anticoagulants: Secondary | ICD-10-CM

## 2020-09-02 LAB — POCT INR: INR: 3 (ref 2.0–3.0)

## 2020-09-02 NOTE — Patient Instructions (Signed)
continue taking 1 tablet daily except 1/2 tablet on Friday.  INR check in 6 weeks. 

## 2020-09-29 DIAGNOSIS — E039 Hypothyroidism, unspecified: Secondary | ICD-10-CM | POA: Diagnosis not present

## 2020-10-12 ENCOUNTER — Ambulatory Visit (INDEPENDENT_AMBULATORY_CARE_PROVIDER_SITE_OTHER): Payer: Medicare Other

## 2020-10-12 ENCOUNTER — Other Ambulatory Visit: Payer: Self-pay

## 2020-10-12 DIAGNOSIS — Z7901 Long term (current) use of anticoagulants: Secondary | ICD-10-CM

## 2020-10-12 DIAGNOSIS — Z5181 Encounter for therapeutic drug level monitoring: Secondary | ICD-10-CM | POA: Diagnosis not present

## 2020-10-12 LAB — POCT INR: INR: 2.5 (ref 2.0–3.0)

## 2020-10-12 NOTE — Patient Instructions (Signed)
continue taking 1 tablet daily except 1/2 tablet on Friday.  INR check in 6 weeks.

## 2020-10-26 NOTE — Progress Notes (Signed)
Cardiology Office Note:    Date:  10/27/2020   ID:  Allen [name], DOB [birth year], MRN [id number]  PCP:  Street, [name], MD  Cardiologist:  Shirlee More, MD    Referring MD: [address] [name], *    ASSESSMENT:    1. Paroxysmal atrial fibrillation (HCC)   2. Chronic anticoagulation   3. Hypertensive heart disease without heart failure   4. Mixed hyperlipidemia    PLAN:    In order of problems listed above:  1. Very complex case he has a large burden of symptoms suggesting both atrial fibrillation with rapid rate and significant bradycardia but we have missed him on event monitors he is quite frustrated and I think the best solution to his problem is an implanted loop recorder to guide [place] whether he is having significant arrhythmia to justify antiarrhythmic drug therapy and/or device.  He is interested I will refer to EP for to consider an implanted loop recorder for now I would not start antiarrhythmic drug continue his beta-blocker and warfarin anticoagulation 2. Stable BP at target on current treatment continue ACE inhibitor 3. Continue statin with previous stroke   Next appointment: 6 months   Medication Adjustments/Labs and Tests Ordered: Current medicines are reviewed at length with the patient today.  Concerns regarding medicines are outlined above.  Orders Placed This Encounter  Procedures  . Comprehensive metabolic panel  . Lipid panel  . Ambulatory referral to Cardiac Electrophysiology   No orders of the defined types were placed in this encounter.   Chief Complaint  Patient presents with  . Follow-up  . Atrial Fibrillation  . Congestive Heart Failure  . Anticoagulation    History of Present Illness:    Allen [name] is a 80 y.o. male with a hx of nonischemic cardiomyopathy with normalization of left ventricular systolic function SVT with EP catheter ablation paroxysmal atrial fibrillation with EP ablation and stroke with anticoagulation  hypertensive heart disease peripheral arterial disease with EVAR abdominal aortic aneurysm and hypothyroidism.  He was last seen 04/24/2020.  Compliance with diet, lifestyle and medications: Yes  Continues to have episodes of what he perceives to be atrial fibrillation and also has had multiple episodes of near loss of consciousness.  Unfortunately has missed and is ZIO monitor.  Unfortunately does not have a cell phone.  I have advised him I think that an implanted loop recorder would help [place] to judge if he has significant bradycardia or is having recurrent atrial fibrillation symptomatic I would cause [place] to use an antiarrhythmic drug.  I gave him the option of taking one of the ZIO monitor his home with him and putting it on when he starts to have symptoms but he declines.  I will refer him to Dr. Curt Bears EP for consideration of implanted loop recorder. No chest pain shortness of breath or syncope.  No bleeding from his anticoagulants.  He is on warfarin followed in our anticoagulant program his INRs have generally been in range recently Component Ref Range & Units 2 wk ago 1 mo ago 3 mo ago 4 mo ago 5 mo ago 6 mo ago 7 mo ago  INR 2.0 - 3.0 2.5  3.0  1.8Abnormal  2.5  1.7Abnormal  2.0  1.6Abnormal     Past Medical History:  Diagnosis Date  . AAA (abdominal aortic aneurysm) ([place])   . Atrial fibrillation ([place])   . Cardiomyopathy, nonischemic ([place]) 10/28/2018  . CHF (congestive heart failure) ([place])   . CVA (  cerebral infarction) 1969  . Hypertension   . Irregular heart beat   . Malaise and fatigue 11/05/2019  . Murmur 11/05/2019  . Palpitations 11/05/2019  . Prostatitis   . PSVT (paroxysmal supraventricular tachycardia) ([place]) 05/22/2017   Formatting of this note might be different from the original. Prior ablation 2006  . Thyroid disease    Hypothyrodidism  . Varicose veins     Past Surgical History:  Procedure Laterality Date  . ABDOMINAL AORTIC ANEURYSM REPAIR    . ABLATION    .  CAROTID ENDARTERECTOMY  November 21, 2006   Left cea  . ENDOVENOUS ABLATION SAPHENOUS VEIN W/ LASER Left 11-28-2012   left greater saphenous vein by Curt Jews MD  . ENDOVENOUS ABLATION SAPHENOUS VEIN W/ LASER Right 12-26-2012   right gretaer saphenous vein by Curt Jews MD  . INGUINAL HERNIA REPAIR     X's  4  . INGUINAL HERNIA REPAIR  09-2011   left side  . stab phlebectomy Left 03-13-2013   10-20 incisions by Curt Jews MD  . TONSILLECTOMY AND ADENOIDECTOMY      Current Medications: Current Meds  Medication Sig  . acyclovir (ZOVIRAX) 800 MG tablet Take 400 mg by mouth 2 (two) times daily.  . finasteride (PROSCAR) 5 MG tablet Take 1 tablet by mouth daily.  Marland Kitchen levothyroxine (SYNTHROID) 200 MCG tablet Take 200 mcg by mouth daily.   Marland Kitchen lisinopril (PRINIVIL,ZESTRIL) 10 MG tablet Take 10 mg by mouth daily. Take 0.5 tablet (5 mg) daily  . metoprolol tartrate (LOPRESSOR) 25 MG tablet Take 12.5 mg by mouth 2 (two) times daily. Takes 0.5 tablet twice a day  . omeprazole (PRILOSEC) 20 MG capsule Take 20 mg by mouth daily.  . polyethylene glycol powder (GLYCOLAX/MIRALAX) 17 GM/SCOOP powder as needed.  . psyllium (METAMUCIL) 58.6 % packet TAKE 1 TABLESPOONFUL BY MOUTH DAILY (MIX IN 8 OUNCES OF WATER OR JUICE AND DRINK)  . sildenafil (VIAGRA) 25 MG tablet Take 100 mg by mouth as needed. Takes 0.5 tablet (50 mg) prn  . silodosin (RAPAFLO) 8 MG CAPS capsule Take 8 mg by mouth daily with breakfast. Pt. States he takes 1 tablet daily for 3 days after intercourse.  . simvastatin (ZOCOR) 10 MG tablet Take 10 mg by mouth at bedtime. Reported on 01/04/2016  . Tamsulosin HCl (FLOMAX) 0.4 MG CAPS Take by mouth. Reported on 01/04/2016  . warfarin (COUMADIN) 4 MG tablet TAKE 1/2 TO 1 (ONE-HALF TO ONE) TABLET BY MOUTH ONCE DAILY AS DIRECTED BY  COUMADIN  CLINIC  . [DISCONTINUED] FINASTERIDE PO Take 5 mg by mouth daily.     Allergies:   Digoxin and Other   Social History   Socioeconomic History  . Marital  status: Divorced    Spouse name: Not on file  . Number of children: Not on file  . Years of education: Not on file  . Highest education level: Not on file  Occupational History  . Not on file  Tobacco Use  . Smoking status: Former Smoker    Types: Cigarettes    Quit date: 09/04/1993    Years since quitting: 27.1  . Smokeless tobacco: Never Used  Vaping Use  . Vaping Use: Never used  Substance and Sexual Activity  . Alcohol use: Yes    Alcohol/week: 2.0 - 4.0 standard drinks    Types: 2 - 4 Glasses of wine per week  . Drug use: No  . Sexual activity: Not on file  Other Topics Concern  .  Not on file  Social History Narrative  . Not on file   Social Determinants of Health   Financial Resource Strain: Not on file  Food Insecurity: Not on file  Transportation Needs: Not on file  Physical Activity: Not on file  Stress: Not on file  Social Connections: Not on file     Family History: The patient's family history includes Angina in his mother; Heart disease in his sister; Hypertension in his sister; Stroke in his father. ROS:   Please see the history of present illness.    All other systems reviewed and are negative.  EKGs/Labs/Other Studies Reviewed:    The following studies were reviewed today:    Recent Labs: 04/26/2020: ALT 9; BUN 16; Creatinine, Ser 0.98; Potassium 4.4; Sodium 134; TSH 0.104  Recent Lipid Panel    Component Value Date/Time   CHOL 115 04/26/2020 0840   TRIG 45 04/26/2020 0840   HDL 47 04/26/2020 0840   CHOLHDL 2.4 04/26/2020 0840   LDLCALC 57 04/26/2020 0840    Physical Exam:    VS:  BP 140/82   Pulse (!) 52   Ht 6' (1.829 m)   Wt 197 lb 6.4 oz (89.5 kg)   SpO2 98%   BMI 26.77 kg/m     Wt Readings from Last 3 Encounters:  10/27/20 197 lb 6.4 oz (89.5 kg)  04/26/20 193 lb (87.5 kg)  02/12/20 197 lb 12.8 oz (89.7 kg)     GEN:  Well nourished, well developed in no acute distress HEENT: Normal NECK: No JVD; No carotid  bruits LYMPHATICS: No lymphadenopathy CARDIAC: RRR, no murmurs, rubs, gallops RESPIRATORY:  Clear to auscultation without rales, wheezing or rhonchi  ABDOMEN: Soft, non-tender, non-distended MUSCULOSKELETAL:  No edema; No deformity  SKIN: Warm and dry NEUROLOGIC:  Alert and oriented x 3 PSYCHIATRIC:  Normal affect    Signed, Shirlee More, MD  10/27/2020 11:12 AM    [place]

## 2020-10-27 ENCOUNTER — Encounter: Payer: Self-pay | Admitting: Cardiology

## 2020-10-27 ENCOUNTER — Other Ambulatory Visit: Payer: Self-pay

## 2020-10-27 ENCOUNTER — Telehealth: Payer: Self-pay

## 2020-10-27 ENCOUNTER — Ambulatory Visit: Payer: Medicare Other | Admitting: Cardiology

## 2020-10-27 VITALS — BP 140/82 | HR 52 | Ht 72.0 in | Wt 197.4 lb

## 2020-10-27 DIAGNOSIS — E782 Mixed hyperlipidemia: Secondary | ICD-10-CM | POA: Diagnosis not present

## 2020-10-27 DIAGNOSIS — I119 Hypertensive heart disease without heart failure: Secondary | ICD-10-CM

## 2020-10-27 DIAGNOSIS — I480 Paroxysmal atrial fibrillation: Secondary | ICD-10-CM | POA: Diagnosis not present

## 2020-10-27 DIAGNOSIS — Z7901 Long term (current) use of anticoagulants: Secondary | ICD-10-CM | POA: Diagnosis not present

## 2020-10-27 LAB — COMPREHENSIVE METABOLIC PANEL
ALT: 11 IU/L (ref 0–44)
AST: 17 IU/L (ref 0–40)
Albumin/Globulin Ratio: 1.5 (ref 1.2–2.2)
Albumin: 4.1 g/dL (ref 3.7–4.7)
Alkaline Phosphatase: 74 IU/L (ref 44–121)
BUN/Creatinine Ratio: 15 (ref 10–24)
BUN: 14 mg/dL (ref 8–27)
Bilirubin Total: 0.7 mg/dL (ref 0.0–1.2)
CO2: 22 mmol/L (ref 20–29)
Calcium: 8.8 mg/dL (ref 8.6–10.2)
Chloride: 100 mmol/L (ref 96–106)
Creatinine, Ser: 0.94 mg/dL (ref 0.76–1.27)
GFR calc Af Amer: 89 mL/min/{1.73_m2} (ref >59)
GFR calc non Af Amer: 77 mL/min/{1.73_m2} (ref >59)
Globulin, Total: 2.7 g/dL (ref 1.5–4.5)
Glucose: 96 mg/dL (ref 65–99)
Potassium: 4.8 mmol/L (ref 3.5–5.2)
Sodium: 134 mmol/L (ref 134–144)
Total Protein: 6.8 g/dL (ref 6.0–8.5)

## 2020-10-27 LAB — LIPID PANEL
Chol/HDL Ratio: 2.9 ratio (ref 0.0–5.0)
Cholesterol, Total: 115 mg/dL (ref 100–199)
HDL: 40 mg/dL (ref >39)
LDL Chol Calc (NIH): 59 mg/dL (ref 0–99)
Triglycerides: 79 mg/dL (ref 0–149)
VLDL Cholesterol Cal: 16 mg/dL (ref 5–40)

## 2020-10-27 LAB — TSH+T4F+T3FREE
Free T4: 2.02 ng/dL — ABNORMAL HIGH (ref 0.82–1.77)
T3, Free: 3.1 pg/mL (ref 2.0–4.4)
TSH: 0.376 u[IU]/mL — ABNORMAL LOW (ref 0.450–4.500)

## 2020-10-27 NOTE — Addendum Note (Signed)
Addended by: Resa Miner I on: 10/27/2020 11:17 AM   Modules accepted: Orders

## 2020-10-27 NOTE — Telephone Encounter (Signed)
-----   Message from Richardo Priest, MD sent at 10/27/2020  4:46 PM EST ----- Good results except He is taking a large dose of thyroid which appears to be a little excessive Would reduce to 0.150 mg Synthroid daily

## 2020-10-27 NOTE — Patient Instructions (Signed)

## 2020-10-27 NOTE — Telephone Encounter (Signed)
Left message on patients voicemail to please return our call.   

## 2020-10-28 ENCOUNTER — Telehealth: Payer: Self-pay

## 2020-10-28 NOTE — Telephone Encounter (Signed)
-----   Message from Richardo Priest, MD sent at 10/27/2020  4:46 PM EST ----- Good results except He is taking a large dose of thyroid which appears to be a little excessive Would reduce to 0.150 mg Synthroid daily

## 2020-10-28 NOTE — Telephone Encounter (Signed)
Spoke with patient regarding results and recommendation.  Patient verbalizes understanding and is agreeable to plan of care. Advised patient to call back with any issues or concerns.   Patient states that he will have his PCP to check his synthroid dosage/levels.

## 2020-11-01 ENCOUNTER — Other Ambulatory Visit: Payer: Self-pay | Admitting: Cardiology

## 2020-11-01 DIAGNOSIS — I25119 Atherosclerotic heart disease of native coronary artery with unspecified angina pectoris: Secondary | ICD-10-CM | POA: Diagnosis not present

## 2020-11-01 DIAGNOSIS — I672 Cerebral atherosclerosis: Secondary | ICD-10-CM | POA: Diagnosis not present

## 2020-11-01 DIAGNOSIS — I480 Paroxysmal atrial fibrillation: Secondary | ICD-10-CM | POA: Diagnosis not present

## 2020-11-01 DIAGNOSIS — I714 Abdominal aortic aneurysm, without rupture: Secondary | ICD-10-CM | POA: Diagnosis not present

## 2020-11-01 DIAGNOSIS — I471 Supraventricular tachycardia: Secondary | ICD-10-CM | POA: Diagnosis not present

## 2020-11-01 DIAGNOSIS — D6869 Other thrombophilia: Secondary | ICD-10-CM | POA: Diagnosis not present

## 2020-11-01 DIAGNOSIS — E039 Hypothyroidism, unspecified: Secondary | ICD-10-CM | POA: Diagnosis not present

## 2020-11-23 ENCOUNTER — Ambulatory Visit (INDEPENDENT_AMBULATORY_CARE_PROVIDER_SITE_OTHER): Payer: Medicare Other | Admitting: Pharmacist

## 2020-11-23 ENCOUNTER — Other Ambulatory Visit: Payer: Self-pay

## 2020-11-23 DIAGNOSIS — I4891 Unspecified atrial fibrillation: Secondary | ICD-10-CM | POA: Diagnosis not present

## 2020-11-23 DIAGNOSIS — Z7901 Long term (current) use of anticoagulants: Secondary | ICD-10-CM

## 2020-11-23 LAB — POCT INR: INR: 2.6 (ref 2.0–3.0)

## 2020-11-23 NOTE — Patient Instructions (Addendum)
Description   Continue taking 1 tablet daily except 1/2 tablet on Friday.  INR check in 6 weeks.

## 2020-12-06 ENCOUNTER — Ambulatory Visit: Payer: Medicare Other | Admitting: Cardiology

## 2020-12-06 ENCOUNTER — Other Ambulatory Visit: Payer: Self-pay

## 2020-12-06 ENCOUNTER — Encounter: Payer: Self-pay | Admitting: Cardiology

## 2020-12-06 VITALS — BP 100/66 | HR 60 | Ht 72.0 in | Wt 198.2 lb

## 2020-12-06 DIAGNOSIS — I480 Paroxysmal atrial fibrillation: Secondary | ICD-10-CM

## 2020-12-06 NOTE — Progress Notes (Signed)
Electrophysiology Office Note   Date:  12/06/2020   ID:  Allen [name], DOB Jul 02, 1941, MRN [id number]  PCP:  Street, [name], MD  Cardiologist:  Bettina Gavia Primary Electrophysiologist:  Kelise Kuch Meredith Leeds, MD    Chief Complaint: AF   History of Present Illness: Allen [name] is a 80 y.o. male who is being seen today for the evaluation of AF at the request of Bettina Gavia, Hilton Cork, MD. Presenting today for electrophysiology evaluation.  He has a history of atrial fibrillation, CVA, hypertension, SVT status post ablation.  He has an abdominal aortic aneurysm and is status post EVAR.  He has had multiple episodes that he perceives to be atrial fibrillation as well as multiple episodes of near loss of consciousness.  He states that he had an ablation several years ago.  He is unclear whether or not this was for SVT or atrial fibrillation.  He does state that he has palpitations quite often, on a daily basis.  He wore a cardiac monitor and July 2021 that showed a sinus rhythm with short runs of SVT and minimal atrial and ventricular ectopy.  He also states that he cannot get his blood pressure elevated, that he has been having bradycardia which makes it difficult for him to exert himself.  Today, he denies symptoms of palpitations, chest pain, shortness of breath, orthopnea, PND, lower extremity edema, claudication, dizziness, presyncope, syncope, bleeding, or neurologic sequela. The patient is tolerating medications without difficulties.    Past Medical History:  Diagnosis Date  . AAA (abdominal aortic aneurysm) ([place])   . Atrial fibrillation ([place])   . Cardiomyopathy, nonischemic ([place]) 10/28/2018  . CHF (congestive heart failure) ([place])   . CVA (cerebral infarction) 1969  . Hypertension   . Irregular heart beat   . Malaise and fatigue 11/05/2019  . Murmur 11/05/2019  . Palpitations 11/05/2019  . Prostatitis   . PSVT (paroxysmal supraventricular tachycardia) ([place]) 05/22/2017   Formatting of  this note might be different from the original. Prior ablation 2006  . Thyroid disease    Hypothyrodidism  . Varicose veins    Past Surgical History:  Procedure Laterality Date  . ABDOMINAL AORTIC ANEURYSM REPAIR    . ABLATION    . CAROTID ENDARTERECTOMY  November 21, 2006   Left cea  . ENDOVENOUS ABLATION SAPHENOUS VEIN W/ LASER Left 11-28-2012   left greater saphenous vein by Curt Jews MD  . ENDOVENOUS ABLATION SAPHENOUS VEIN W/ LASER Right 12-26-2012   right gretaer saphenous vein by Curt Jews MD  . INGUINAL HERNIA REPAIR     X's  4  . INGUINAL HERNIA REPAIR  09-2011   left side  . stab phlebectomy Left 03-13-2013   10-20 incisions by Curt Jews MD  . TONSILLECTOMY AND ADENOIDECTOMY       Current Outpatient Medications  Medication Sig Dispense Refill  . acyclovir (ZOVIRAX) 800 MG tablet Take 400 mg by mouth 2 (two) times daily.    . finasteride (PROSCAR) 5 MG tablet Take 1 tablet by mouth daily.    Marland Kitchen levothyroxine (SYNTHROID) 150 MCG tablet Take 150 mcg by mouth daily before breakfast.    . lisinopril (PRINIVIL,ZESTRIL) 10 MG tablet Take 10 mg by mouth daily. Take 0.5 tablet (5 mg) daily    . metoprolol tartrate (LOPRESSOR) 25 MG tablet Take 12.5 mg by mouth 2 (two) times daily. Takes 0.5 tablet twice a day    . omeprazole (PRILOSEC) 20 MG capsule Take 20 mg by mouth daily.    Marland Kitchen  polyethylene glycol powder (GLYCOLAX/MIRALAX) 17 GM/SCOOP powder as needed.    . psyllium (METAMUCIL) 58.6 % packet TAKE 1 TABLESPOONFUL BY MOUTH DAILY (MIX IN 8 OUNCES OF WATER OR JUICE AND DRINK)    . sildenafil (VIAGRA) 25 MG tablet Take 100 mg by mouth as needed. Takes 0.5 tablet (50 mg) prn    . silodosin (RAPAFLO) 8 MG CAPS capsule Take 8 mg by mouth daily with breakfast. Pt. States he takes 1 tablet daily for 3 days after intercourse.    . simvastatin (ZOCOR) 20 MG tablet Take 10 mg by mouth at bedtime.    . Tamsulosin HCl (FLOMAX) 0.4 MG CAPS Take by mouth. Reported on 01/04/2016    . warfarin  (COUMADIN) 4 MG tablet TAKE 1/2 TO 1 (ONE-HALF TO ONE) TABLET BY MOUTH ONCE DAILY AS DIRECTED BY COUMADIN CLINIC 90 tablet 0   No current facility-administered medications for this visit.    Allergies:   Digoxin and Other   Social History:  The patient  reports that he quit smoking about 27 years ago. His smoking use included cigarettes. He has never used smokeless tobacco. He reports current alcohol use of about 2.0 - 4.0 standard drinks of alcohol per week. He reports that he does not use drugs.   Family History:  The patient's family history includes Angina in his mother; Heart disease in his sister; Hypertension in his sister; Stroke in his father.    ROS:  Please see the history of present illness.   Otherwise, review of systems is positive for none.   All other systems are reviewed and negative.    PHYSICAL EXAM: VS:  BP 100/66   Pulse 60   Ht 6' (1.829 m)   Wt 198 lb 3.2 oz (89.9 kg)   SpO2 96%   BMI 26.88 kg/m  , BMI Body mass index is 26.88 kg/m. GEN: Well nourished, well developed, in no acute distress  HEENT: normal  Neck: no JVD, carotid bruits, or masses Cardiac: RRR; no murmurs, rubs, or gallops,no edema  Respiratory:  clear to auscultation bilaterally, normal work of breathing GI: soft, nontender, nondistended, + BS MS: no deformity or atrophy  Skin: warm and dry Neuro:  Strength and sensation are intact Psych: euthymic mood, full affect  EKG:  EKG is ordered today. Personal review of the ekg ordered shows sinus rhythm, rate 60, PACs  Recent Labs: 10/27/2020: ALT 11; BUN 14; Creatinine, Ser 0.94; Potassium 4.8; Sodium 134; TSH 0.376    Lipid Panel     Component Value Date/Time   CHOL 115 10/27/2020 1121   TRIG 79 10/27/2020 1121   HDL 40 10/27/2020 1121   CHOLHDL 2.9 10/27/2020 1121   LDLCALC 59 10/27/2020 1121     Wt Readings from Last 3 Encounters:  12/06/20 198 lb 3.2 oz (89.9 kg)  10/27/20 197 lb 6.4 oz (89.5 kg)  04/26/20 193 lb (87.5 kg)       Other studies Reviewed: Additional studies/ records that were reviewed today include: Cardiac monitor 03/14/20 personally reviewed  Review of the above records today demonstrates:  Conclusion, rare ventricular ectopy occasional supraventricular ectopy and triggered and diary events associated with APCs and PVCs.    ASSESSMENT AND PLAN:  1.  Paroxysmal atrial fibrillation: Currently on warfarin.  CHA2DS2-VASc of 6.  He has had multiple episodes where he feels he has been in atrial fibrillation, but none of these have shown up on cardiac monitoring.  He also feels that he has had significant  bradycardia and has been near syncopal.  I have not seen any obvious bradycardia on cardiac monitoring, and his most recent monitor did not show any evidence of atrial fibrillation.  Linq monitoring may be beneficial.  Risks and benefits were discussed include bleeding and infection.  The patient would like to consider this further.  He Bunnie Lederman let us know if he wishes for an implant.  2.  Hypertension: Currently well controlled  3.  Hyperlipidemia: Continue statin per primary cardiology.  Case discussed with primary cardiology  Current medicines are reviewed at length with the patient today.   The patient does not have concerns regarding his medicines.  The following changes were made today:  none  Labs/ tests ordered today include:  Orders Placed This Encounter  Procedures  . EKG 12-Lead     Disposition:   FU with Clevie Prout   Signed, Nasteho Glantz Meredith Leeds, MD  12/06/2020 12:00 PM     [address] [place] [place] [address] [phone] (office) [phone] (fax)

## 2020-12-06 NOTE — Patient Instructions (Signed)
Medication Instructions:  Your physician recommends that you continue on your current medications as directed. Please refer to the Current Medication list given to you today.  *If you need a refill on your cardiac medications before your next appointment, please call your pharmacy*   Lab Work: None ordered   Testing/Procedures: Your physician has recommended that you have a loop recorder inserted.  We will check with insurance prior to scheduling this.   Follow-Up: At [facility], you and your health needs are our priority.  As part of our continuing mission to provide you with exceptional heart care, we have created designated Provider Care Teams.  These Care Teams include your primary Cardiologist (physician) and Advanced Practice Providers (APPs -  Physician Assistants and Nurse Practitioners) who all work together to provide you with the care you need, when you need it.   Your next appointment:    to be determined  The format for your next appointment:   In Person  Provider:   Allegra Lai, MD    Thank you for choosing [facility]!!   Trinidad Curet, RN [phone]   Other Instructions   Implantable Loop Recorder Placement  An implantable loop recorder is a small electronic device that is placed under the skin of your chest. The device records the electrical activity of your heart over a long period of time. Your health care provider can download these recordings to monitor your heart. You may need an implantable loop recorder if you have periods of abnormal heart activity (arrhythmias) or unexplained fainting (syncope). The recorder can be left in place for 1 year or longer. Tell a health care provider about:  Any allergies you have.  All medicines you are taking, including vitamins, herbs, eye drops, creams, and over-the-counter medicines.  Any problems you or family members have had with anesthetic medicines.  Any blood disorders you have.  Any  surgeries you have had.  Any medical conditions you have.  Whether you are pregnant or may be pregnant. What are the risks? Generally, this is a safe procedure. However, problems may occur, including:  Infection.  Bleeding.  Allergic reactions to anesthetic medicines.  Damage to nerves or blood vessels.  Failure of the device to work. This could require another surgery to replace it. What happens before the procedure?  You may have a physical exam, blood tests, and imaging tests of your heart, such as a chest X-ray.  Follow instructions from your health care provider about eating or drinking restrictions.  Ask your health care provider about: ? Changing or stopping your regular medicines. This is especially important if you are taking diabetes medicines or blood thinners. ? Taking medicines such as aspirin and ibuprofen. These medicines can thin your blood. Do not take these medicines unless your health care provider tells you to take them. ? Taking over-the-counter medicines, vitamins, herbs, and supplements.  Ask your health care provider how your surgical site will be marked or identified.  Ask your health care provider what steps will be taken to help prevent infection. These may include: ? Removing hair at the surgery site. ? Washing skin with a germ-killing soap.  Plan to have someone take you home from the hospital or clinic.  Plan to have a responsible adult care for you for at least 24 hours after you leave the hospital or clinic. This is important.  Do not use any products that contain nicotine or tobacco, such as cigarettes and e-cigarettes. If you need help quitting,  ask your health care provider.   What happens during the procedure?  An IV will be inserted into one of your veins.  You may be given one or more of the following: ? A medicine to help you relax (sedative). ? A medicine to numb the area (local anesthetic).  A small incision will be made on the  left side of your upper chest.  A pocket will be created under your skin.  The device will be placed in the pocket.  The incision will be closed with stitches (sutures) or adhesive strips.  A bandage (dressing) will be placed over the incision. The procedure may vary among health care providers and hospitals. What happens after the procedure?  Your blood pressure, heart rate, breathing rate, and blood oxygen level will be monitored until you leave the hospital or clinic.  You may be able to go home on the day of your surgery. Before you go home: ? Your health care provider will program your recorder. ? You will learn how to trigger your device with a handheld activator. ? You will learn how to send recordings to your health care provider. ? You will get an ID card for your device, and you will be told when to use it.  Do not drive for 24 hours if you were given a sedative during your procedure. Summary  An implantable loop recorder is a small electronic device that is placed under the skin of your chest to monitor your heart over a long period of time.  The recorder can be left in place for 1 year or longer.  Plan to have someone take you home from the hospital or clinic. This information is not intended to replace advice given to you by your health care provider. Make sure you discuss any questions you have with your health care provider. Document Revised: 05/11/2020 Document Reviewed: 10/06/2017 Elsevier Patient Education  2021 [employer].

## 2020-12-13 DIAGNOSIS — J302 Other seasonal allergic rhinitis: Secondary | ICD-10-CM | POA: Diagnosis not present

## 2020-12-24 ENCOUNTER — Telehealth: Payer: Self-pay | Admitting: *Deleted

## 2020-12-24 NOTE — Telephone Encounter (Signed)
Edwards, Allen [name]  Jamilia Jacques L, RN Pt called me back, he does not want ILR implanted.

## 2020-12-24 NOTE — Telephone Encounter (Signed)
-----   Message from Alben Spittle sent at 12/24/2020  9:04 AM EDT ----- Regarding: RE: ILR implant in office Pt called me back, he does not want ILR implanted. ----- Message ----- From: Stanton Kidney, RN Sent: 12/22/2020   2:18 PM EDT To: Stanton Kidney, RN, Alben Spittle Subject: FW: ILR implant in office                      [place] please arrange for in-office ILR implant w/ [employer]....... Josem Kaufmann expires in June)  ----- Message ----- From: Ciro Backer Sent: 12/14/2020   2:13 PM EDT To: Stanton Kidney, RN Subject: RE: ILR implant in office                      Good afternoon,  Auth is now of file for the ILR implant. Josem Kaufmann will expire in June.   Thank you,  Estill Bamberg ----- Message ----- From: Ciro Backer Sent: 12/08/2020   9:59 AM EDT To: Ciro Backer Subject: ILR implant in office                           ----- Message ----- From: Stanton Kidney, RN Sent: 12/06/2020  12:04 PM EDT To: Stanton Kidney, RN, # Subject: ?? ILR implant in office                       Please precert for ILR implant in office by [place] for AFib mngt. Pt not scheduled for this yet, will schedule once approved and if pt can afford.  Let me know insurance response.   Thx Emmett Bracknell

## 2021-01-04 ENCOUNTER — Ambulatory Visit (INDEPENDENT_AMBULATORY_CARE_PROVIDER_SITE_OTHER): Payer: Medicare Other

## 2021-01-04 ENCOUNTER — Other Ambulatory Visit: Payer: Self-pay

## 2021-01-04 DIAGNOSIS — I4891 Unspecified atrial fibrillation: Secondary | ICD-10-CM | POA: Diagnosis not present

## 2021-01-04 DIAGNOSIS — Z7901 Long term (current) use of anticoagulants: Secondary | ICD-10-CM | POA: Diagnosis not present

## 2021-01-04 LAB — POCT INR: INR: 1.9 — AB (ref 2.0–3.0)

## 2021-01-04 NOTE — Patient Instructions (Signed)
Take extra 0.5 tablet today only and then Continue taking 1 tablet daily except 1/2 tablet on Friday.  INR check in 7 weeks.

## 2021-02-07 ENCOUNTER — Other Ambulatory Visit: Payer: Self-pay | Admitting: Cardiology

## 2021-02-22 ENCOUNTER — Ambulatory Visit: Payer: Medicare Other

## 2021-02-22 ENCOUNTER — Other Ambulatory Visit: Payer: Self-pay

## 2021-02-22 DIAGNOSIS — L570 Actinic keratosis: Secondary | ICD-10-CM | POA: Diagnosis not present

## 2021-02-22 DIAGNOSIS — L219 Seborrheic dermatitis, unspecified: Secondary | ICD-10-CM | POA: Diagnosis not present

## 2021-02-22 DIAGNOSIS — D225 Melanocytic nevi of trunk: Secondary | ICD-10-CM | POA: Diagnosis not present

## 2021-02-22 DIAGNOSIS — Z7901 Long term (current) use of anticoagulants: Secondary | ICD-10-CM

## 2021-02-22 DIAGNOSIS — L821 Other seborrheic keratosis: Secondary | ICD-10-CM | POA: Diagnosis not present

## 2021-02-22 DIAGNOSIS — D2239 Melanocytic nevi of other parts of face: Secondary | ICD-10-CM | POA: Diagnosis not present

## 2021-02-22 LAB — POCT INR: INR: 2 (ref 2.0–3.0)

## 2021-02-22 NOTE — Patient Instructions (Signed)
Continue taking 1 tablet daily except 1/2 tablet on Friday.  INR check in 7 weeks. [phone]

## 2021-03-01 DIAGNOSIS — I672 Cerebral atherosclerosis: Secondary | ICD-10-CM | POA: Diagnosis not present

## 2021-03-01 DIAGNOSIS — Z79899 Other long term (current) drug therapy: Secondary | ICD-10-CM | POA: Diagnosis not present

## 2021-03-01 DIAGNOSIS — Z0000 Encounter for general adult medical examination without abnormal findings: Secondary | ICD-10-CM | POA: Diagnosis not present

## 2021-03-01 DIAGNOSIS — I480 Paroxysmal atrial fibrillation: Secondary | ICD-10-CM | POA: Diagnosis not present

## 2021-03-01 DIAGNOSIS — D6869 Other thrombophilia: Secondary | ICD-10-CM | POA: Diagnosis not present

## 2021-03-01 DIAGNOSIS — I714 Abdominal aortic aneurysm, without rupture: Secondary | ICD-10-CM | POA: Diagnosis not present

## 2021-03-01 DIAGNOSIS — E039 Hypothyroidism, unspecified: Secondary | ICD-10-CM | POA: Diagnosis not present

## 2021-03-01 DIAGNOSIS — R7301 Impaired fasting glucose: Secondary | ICD-10-CM | POA: Diagnosis not present

## 2021-03-01 DIAGNOSIS — I25119 Atherosclerotic heart disease of native coronary artery with unspecified angina pectoris: Secondary | ICD-10-CM | POA: Diagnosis not present

## 2021-03-01 DIAGNOSIS — E785 Hyperlipidemia, unspecified: Secondary | ICD-10-CM | POA: Diagnosis not present

## 2021-03-01 DIAGNOSIS — I10 Essential (primary) hypertension: Secondary | ICD-10-CM | POA: Diagnosis not present

## 2021-03-01 DIAGNOSIS — I471 Supraventricular tachycardia: Secondary | ICD-10-CM | POA: Diagnosis not present

## 2021-03-16 ENCOUNTER — Telehealth: Payer: Self-pay | Admitting: *Deleted

## 2021-03-16 NOTE — Telephone Encounter (Signed)
Pt called in today. Stated he would like to proceed with ILR implant. Aware that approval expired in June and we will need to precertify with insurance again. Aware I will forward to billing for precert and scheduler. Once approved scheduler will call him to schedule him to come in for OV and implant. Pt also wants [place] to know that the [place] said they would cover anything that insurance wouldn't.  Made aware that he may have to discuss this with billing or the [place]. Patient verbalized understanding and agreeable to plan.

## 2021-03-28 NOTE — Telephone Encounter (Signed)
Patient calling to follow up. He states he received a prior authorization and is not sure if it is correct. He would like to know if he can schedule his procedure.

## 2021-03-30 NOTE — Telephone Encounter (Signed)
Pt scheduled for September

## 2021-04-12 ENCOUNTER — Other Ambulatory Visit: Payer: Self-pay

## 2021-04-12 ENCOUNTER — Ambulatory Visit (INDEPENDENT_AMBULATORY_CARE_PROVIDER_SITE_OTHER): Payer: No Typology Code available for payment source

## 2021-04-12 DIAGNOSIS — Z7901 Long term (current) use of anticoagulants: Secondary | ICD-10-CM | POA: Diagnosis not present

## 2021-04-12 LAB — POCT INR: INR: 2.4 (ref 2.0–3.0)

## 2021-04-12 NOTE — Patient Instructions (Signed)
Continue taking 1 tablet daily except 1/2 tablet on Friday.  INR check in 8 weeks. [phone]

## 2021-05-10 ENCOUNTER — Other Ambulatory Visit: Payer: Self-pay | Admitting: Cardiology

## 2021-05-10 ENCOUNTER — Other Ambulatory Visit: Payer: Self-pay

## 2021-05-10 ENCOUNTER — Encounter: Payer: Self-pay | Admitting: Cardiology

## 2021-05-10 ENCOUNTER — Ambulatory Visit (INDEPENDENT_AMBULATORY_CARE_PROVIDER_SITE_OTHER): Payer: No Typology Code available for payment source | Admitting: Cardiology

## 2021-05-10 VITALS — BP 126/80 | HR 56 | Ht 72.0 in | Wt 194.0 lb

## 2021-05-10 DIAGNOSIS — I480 Paroxysmal atrial fibrillation: Secondary | ICD-10-CM

## 2021-05-10 DIAGNOSIS — R55 Syncope and collapse: Secondary | ICD-10-CM

## 2021-05-10 NOTE — Progress Notes (Signed)
Electrophysiology Office Note   Date:  05/10/2021   ID:  Allen [name], DOB 02/09/1941, MRN [id number]  PCP:  Street, [name], MD  Cardiologist:  Bettina Gavia Primary Electrophysiologist:  Debany Vantol Meredith Leeds, MD    Chief Complaint: AF   History of Present Illness: Allen [name] is a 80 y.o. male who is being seen today for the evaluation of AF at the request of Street, [name], *. Presenting today for electrophysiology evaluation.  He has a history significant for atrial fibrillation, CVA, hypertension, SVT status post ablation.  He has an abdominal aortic aneurysm status post EVAR.  He has had multiple episodes where he perceives atrial fibrillation.  He had an ablation several years ago ablations for atrial fibrillation or SVT.  He has palpitations on a daily basis.  He wore a cardiac monitor that showed sinus rhythm with short runs of SVT and atrial ectopy.  Today, denies symptoms of palpitations, chest pain, shortness of breath, orthopnea, PND, lower extremity edema, claudication, dizziness, presyncope, syncope, bleeding, or neurologic sequela. The patient is tolerating medications without difficulties.  He continues to have episodes of palpitations.  He also continues to have episodes of near syncope.  His near syncopal episodes occur when he is sitting down as well as standing when he is up and exerting himself.  He has no chest pain or shortness of breath associated with these episodes.  He says that his near syncopal events last few seconds.   Past Medical History:  Diagnosis Date   AAA (abdominal aortic aneurysm) ([place])    Atrial fibrillation ([place])    Cardiomyopathy, nonischemic ([place]) 10/28/2018   CHF (congestive heart failure) ([place])    CVA (cerebral infarction) 1969   Hypertension    Irregular heart beat    Malaise and fatigue 11/05/2019   Murmur 11/05/2019   Palpitations 11/05/2019   Prostatitis    PSVT (paroxysmal supraventricular tachycardia) ([place]) 05/22/2017    Formatting of this note might be different from the original. Prior ablation 2006   Thyroid disease    Hypothyrodidism   Varicose veins    Past Surgical History:  Procedure Laterality Date   ABDOMINAL AORTIC ANEURYSM REPAIR     ABLATION     CAROTID ENDARTERECTOMY  November 21, 2006   Left cea   ENDOVENOUS ABLATION SAPHENOUS VEIN W/ LASER Left 11-28-2012   left greater saphenous vein by Curt Jews MD   ENDOVENOUS ABLATION SAPHENOUS VEIN W/ LASER Right 12-26-2012   right gretaer saphenous vein by Curt Jews MD   INGUINAL HERNIA REPAIR     X's  4   INGUINAL HERNIA REPAIR  09-2011   left side   stab phlebectomy Left 03-13-2013   10-20 incisions by Curt Jews MD   TONSILLECTOMY AND ADENOIDECTOMY       Current Outpatient Medications  Medication Sig Dispense Refill   acyclovir (ZOVIRAX) 800 MG tablet Take 400 mg by mouth 2 (two) times daily.     finasteride (PROSCAR) 5 MG tablet Take 1 tablet by mouth daily.     levothyroxine (SYNTHROID) 150 MCG tablet Take 150 mcg by mouth daily before breakfast.     lisinopril (PRINIVIL,ZESTRIL) 10 MG tablet Take 10 mg by mouth daily. Take 0.5 tablet (5 mg) daily     metoprolol tartrate (LOPRESSOR) 25 MG tablet Take 12.5 mg by mouth 2 (two) times daily. Takes 0.5 tablet twice a day     omeprazole (PRILOSEC) 20 MG capsule Take 20 mg by mouth daily.  polyethylene glycol powder (GLYCOLAX/MIRALAX) 17 GM/SCOOP powder as needed.     psyllium (METAMUCIL) 58.6 % packet TAKE 1 TABLESPOONFUL BY MOUTH DAILY (MIX IN 8 OUNCES OF WATER OR JUICE AND DRINK)     silodosin (RAPAFLO) 8 MG CAPS capsule Take 8 mg by mouth daily with breakfast.     simvastatin (ZOCOR) 20 MG tablet Take 10 mg by mouth at bedtime.     tadalafil (CIALIS) 5 MG tablet Take 5 mg by mouth daily as needed for erectile dysfunction.     Tamsulosin HCl (FLOMAX) 0.4 MG CAPS Take by mouth. Reported on 01/04/2016     warfarin (COUMADIN) 4 MG tablet TAKE 1/2 TO 1 (ONE-HALF TO ONE) TABLET BY MOUTH ONCE  DAILY AS DIRECTED BY  COUMADIN  CLINIC 90 tablet 0   No current facility-administered medications for this visit.    Allergies:   Digoxin and Other   Social History:  The patient  reports that he quit smoking about 27 years ago. His smoking use included cigarettes. He has never used smokeless tobacco. He reports current alcohol use of about 2.0 - 4.0 standard drinks per week. He reports that he does not use drugs.   Family History:  The patient's family history includes Angina in his mother; Heart disease in his sister; Hypertension in his sister; Stroke in his father.   ROS:  Please see the history of present illness.   Otherwise, review of systems is positive for none.   All other systems are reviewed and negative.   PHYSICAL EXAM: VS:  BP 126/80   Pulse (!) 56   Ht 6' (1.829 m)   Wt 194 lb (88 kg)   SpO2 98%   BMI 26.31 kg/m  , BMI Body mass index is 26.31 kg/m. GEN: Well nourished, well developed, in no acute distress  HEENT: normal  Neck: no JVD, carotid bruits, or masses Cardiac: RRR; no murmurs, rubs, or gallops,no edema  Respiratory:  clear to auscultation bilaterally, normal work of breathing GI: soft, nontender, nondistended, + BS MS: no deformity or atrophy  Skin: warm and dry Neuro:  Strength and sensation are intact Psych: euthymic mood, full affect  EKG:  EKG is not ordered today. Personal review of the ekg ordered 12/06/20 shows sinus rhythm, PACs  Recent Labs: 10/27/2020: ALT 11; BUN 14; Creatinine, Ser 0.94; Potassium 4.8; Sodium 134; TSH 0.376    Lipid Panel     Component Value Date/Time   CHOL 115 10/27/2020 1121   TRIG 79 10/27/2020 1121   HDL 40 10/27/2020 1121   CHOLHDL 2.9 10/27/2020 1121   LDLCALC 59 10/27/2020 1121     Wt Readings from Last 3 Encounters:  05/10/21 194 lb (88 kg)  12/06/20 198 lb 3.2 oz (89.9 kg)  10/27/20 197 lb 6.4 oz (89.5 kg)      Other studies Reviewed: Additional studies/ records that were reviewed today include:  Cardiac monitor 03/14/20 personally reviewed  Review of the above records today demonstrates:  Conclusion, rare ventricular ectopy occasional supraventricular ectopy and triggered and diary events associated with APCs and PVCs.    ASSESSMENT AND PLAN:  1.  Paroxysmal atrial fibrillation: Currently on warfarin.  CHA2DS2-VASc of 6.  He feels that he has had frequent episodes of atrial fibrillation, though nothing has shown up on further monitoring.  He is also a few episodes of near syncope.  Rate monitor implant would be recommended.  Risk and benefits of been discussed with bleeding and infection.  The patient  understand these risks and agreed to the procedure.  2.  Hypertension: Currently well controlled  3.  Hyperlipidemia: Continue statin per primary cardiology   Current medicines are reviewed at length with the patient today.   The patient does not have concerns regarding his medicines.  The following changes were made today: None  Labs/ tests ordered today include:  No orders of the defined types were placed in this encounter.    Disposition:   FU with Beth Goodlin 6 months  Signed, Jakaiden Fill Meredith Leeds, MD  05/10/2021 12:18 PM     [address] [place] [address] [phone] (office) [phone] (fax)  SURGEON:  Allegra Lai, MD     PREPROCEDURE DIAGNOSIS: Atrial fibrillation, near syncope    POSTPROCEDURE DIAGNOSIS: Atrial fibrillation, near syncope     PROCEDURES:   1. Implantable loop recorder implantation    INTRODUCTION:  Allen [name] is a 80 y.o. male with a history of atrial who presents today for implantable loop implantation.  The patient has had a atrial fibrillation with episodes of near syncope.   The patient therefore presents today for implantable loop implantation.     DESCRIPTION OF PROCEDURE:  Informed written consent was obtained, and the patient was brought to the electrophysiology lab in a fasting  state.  The patient required no sedation for the procedure today.  Mapping over the patient's chest was performed by the EP lab staff to identify the area where electrograms were most prominent for ILR recording.  This area was found to be the left parasternal region over the 3rd-4th intercostal space. The patients left chest was therefore prepped and draped in the usual sterile fashion by the EP lab staff. The skin overlying the left parasternal region was infiltrated with lidocaine for local analgesia.  A 0.5-cm incision was made over the left parasternal region over the 3rd intercostal space.  A subcutaneous ILR pocket was fashioned using a combination of sharp and blunt dissection.  A Medtronic Reveal Linq model [place] [id number] implantable loop recorder was then placed into the pocket  R waves were very prominent and measured 0.102m. EBL<1 ml.  Steri- Strips and a sterile dressing were then applied.  There were no early apparent complications.     CONCLUSIONS:   1. Successful implantation of a Medtronic Reveal LINQ implantable loop recorder for atrial fibrillation monitoring and near syncope  2. No early apparent complications.

## 2021-05-10 NOTE — Patient Instructions (Signed)
Medication Instructions:  Your physician recommends that you continue on your current medications as directed. Please refer to the Current Medication list given to you today.  *If you need a refill on your cardiac medications before your next appointment, please call your pharmacy*   Lab Work: None ordered   Testing/Procedures: You had a loop recorder implanted in the office today   Follow-Up: At [facility], you and your health needs are our priority.  As part of our continuing mission to provide you with exceptional heart care, we have created designated Provider Care Teams.  These Care Teams include your primary Cardiologist (physician) and Advanced Practice Providers (APPs -  Physician Assistants and Nurse Practitioners) who all work together to provide you with the care you need, when you need it.  We recommend signing up for the patient portal called "MyChart".  Sign up information is provided on this After Visit Summary.  MyChart is used to connect with patients for Virtual Visits (Telemedicine).  Patients are able to view lab/test results, encounter notes, upcoming appointments, etc.  Non-urgent messages can be sent to your provider as well.   To learn more about what you can do with MyChart, go to [email or web address].    Your next appointment:   6 month(s)  The format for your next appointment:   In Person  Provider:   Allegra Lai, MD    Thank you for choosing [facility]!!   Trinidad Curet, RN [phone]   Other Instructions   Implantable Loop Recorder Placement, Care After This sheet gives you information about how to care for yourself after your procedure. Your health care provider may also give you more specific instructions. If you have problems or questions, contact your health care provider. What can I expect after the procedure? After the procedure, it is common to have: Soreness or discomfort near the incision. Some swelling or bruising  near the incision. Follow these instructions at home: Incision care  Follow instructions from your health care provider about how to take care of your incision. Make sure you: Wash your hands with soap and water before you change your bandage (dressing). If soap and water are not available, use hand sanitizer. Change your dressing as told by your health care provider. Keep your dressing dry. Leave stitches (sutures), skin glue, or adhesive strips in place. These skin closures may need to stay in place for 2 weeks or longer. If adhesive strip edges start to loosen and curl up, you may trim the loose edges. Do not remove adhesive strips completely unless your health care provider tells you to do that. Check your incision area every day for signs of infection. Check for: Redness, swelling, or pain. Fluid or blood. Warmth. Pus or a bad smell. Do not take baths, swim, or use a hot tub until your health care provider approves. Ask your health care provider if you can take showers. Activity  Return to your normal activities as told by your health care provider. Ask your health care provider what activities are safe for you. Do not drive for 24 hours if you were given a sedative during your procedure. General instructions Follow instructions from your health care provider about how to manage your implantable loop recorder and transmit the information. Learn how to activate a recording if this is necessary for your type of device. Do not go through a metal detection gate, and do not let someone hold a metal detector over your chest. Show your ID card. Do  not have an MRI unless you check with your health care provider first. Take over-the-counter and prescription medicines only as told by your health care provider. Keep all follow-up visits as told by your health care provider. This is important. Contact a health care provider if: You have redness, swelling, or pain around your incision. You have a  fever. You have pain that is not relieved by your pain medicine. You have triggered your device because of fainting (syncope) or because of a heartbeat that feels like it is racing, slow, fluttering, or skipping (palpitations). Get help right away if you have: Chest pain. Difficulty breathing. Summary After the procedure, it is common to have soreness or discomfort near the incision. Change your dressing as told by your health care provider. Follow instructions from your health care provider about how to manage your implantable loop recorder and transmit the information. Keep all follow-up visits as told by your health care provider. This is important. This information is not intended to replace advice given to you by your health care provider. Make sure you discuss any questions you have with your health care provider. Document Revised: 12/21/2020 Document Reviewed: 12/21/2020 Elsevier Patient Education  [place].

## 2021-05-12 ENCOUNTER — Other Ambulatory Visit: Payer: Self-pay | Admitting: Cardiology

## 2021-05-13 ENCOUNTER — Ambulatory Visit: Payer: No Typology Code available for payment source | Admitting: Cardiology

## 2021-05-31 ENCOUNTER — Telehealth: Payer: Self-pay

## 2021-05-31 NOTE — Telephone Encounter (Signed)
Pt called in and stated that they started bactrim 800mg  bid. Offered the pt the chance to come to [facility] for inr check or the chance to go to the lab and they declined. Will keep 06/07/21 appt . I advised that they should be checked Thursday as megan supple rph instructed.

## 2021-05-31 NOTE — Telephone Encounter (Signed)
Bactrim has a very strong interaction with warfarin. INR typically doubles to triples. Strongly recommend he come in for INR check on Thursday. If he still declines INR check, he is assuming risk of elevated INR without recommended monitoring.

## 2021-06-03 DIAGNOSIS — Z7901 Long term (current) use of anticoagulants: Secondary | ICD-10-CM | POA: Diagnosis not present

## 2021-06-07 ENCOUNTER — Ambulatory Visit (INDEPENDENT_AMBULATORY_CARE_PROVIDER_SITE_OTHER): Payer: No Typology Code available for payment source

## 2021-06-07 ENCOUNTER — Other Ambulatory Visit: Payer: Self-pay

## 2021-06-07 DIAGNOSIS — Z7901 Long term (current) use of anticoagulants: Secondary | ICD-10-CM | POA: Diagnosis not present

## 2021-06-07 DIAGNOSIS — Z5181 Encounter for therapeutic drug level monitoring: Secondary | ICD-10-CM | POA: Diagnosis not present

## 2021-06-07 LAB — POCT INR: INR: 3.1 — AB (ref 2.0–3.0)

## 2021-06-07 NOTE — Patient Instructions (Signed)
Description   Take 0.5 tablet tomorrow (already taken today's dose) and then continue taking 1 tablet daily except 1/2 tablet on Friday. INR check in 6 weeks. [phone]

## 2021-06-13 ENCOUNTER — Ambulatory Visit (INDEPENDENT_AMBULATORY_CARE_PROVIDER_SITE_OTHER): Payer: No Typology Code available for payment source

## 2021-06-13 DIAGNOSIS — I480 Paroxysmal atrial fibrillation: Secondary | ICD-10-CM | POA: Diagnosis not present

## 2021-06-15 LAB — CUP PACEART REMOTE DEVICE CHECK
Date Time Interrogation Session: 20221009122415
Implantable Pulse Generator Implant Date: 20220906

## 2021-06-22 NOTE — Progress Notes (Signed)
Carelink Summary Report / Loop Recorder 

## 2021-06-26 NOTE — Progress Notes (Signed)
Cardiology Office Note:    Date:  06/27/2021   ID:  Allen [name], DOB 1941-05-30, MRN [id number]  PCP:  Street, [name], MD  Cardiologist:  Shirlee More, MD    Referring MD: [address], [name], *    ASSESSMENT:    1. Paroxysmal atrial fibrillation (HCC)   2. Chronic anticoagulation   3. Hypertensive heart disease without heart failure   4. Mixed hyperlipidemia    PLAN:    In order of problems listed above:  Stable.  Not captured recurrent atrial fibrillation continue his beta-blocker anticoagulant.  Also check echocardiogram is at risk for recurrent cardiomyopathy Stable BP at target continue ACE inhibitor Continue statin   Next appointment: 59months   Medication Adjustments/Labs and Tests Ordered: Current medicines are reviewed at length with the patient today.  Concerns regarding medicines are outlined above.  No orders of the defined types were placed in this encounter.  No orders of the defined types were placed in this encounter.   Chief Complaint  Patient presents with   Follow-up   Atrial Fibrillation    He had a recent ILR    History of Present Illness:    Allen [name] is a 80 y.o. male with a hx of nonischemic cardiomyopathy with normalization of left ventricular ejection fraction SVT with EP catheter ablation paroxysmal atrial fibrillation with EP catheter ablation previous stroke with long-term anticoagulation hypertensive heart disease peripheral arterial disease with EVAR for abdominal aortic aneurysm and hypothyroidism last seen 10/27/2020.  With ongoing symptoms of atrial fibrillation was referred for EP evaluation Dr. Curt Bears 05/10/2021 and had a implanted loop recorder.  Device remote check 06/15/2021 showed 1 symptomatic event which was sinus bradycardia with heart rate in the 40s and he had no documented atrial fibrillation.  Compliance with diet, lifestyle and medications: Yes  Unfortunately he feels no better His complaints are  twofold 1 is palpitation and fluttering but also marked exercise intolerance and exertional shortness of breath.  Reviewing his chart he has not had any echocardiogram done in the last few years as a background history of cardiomyopathy certainly is at risk for LV dysfunction and we will go ahead and recheck his echocardiogram.  Also  to get a proBNP level. Past Medical History:  Diagnosis Date   AAA (abdominal aortic aneurysm)    Atrial fibrillation (HCC)    Cardiomyopathy, nonischemic ([place]) 10/28/2018   CHF (congestive heart failure) ([place])    CVA (cerebral infarction) 1969   Hypertension    Irregular heart beat    Malaise and fatigue 11/05/2019   Murmur 11/05/2019   Palpitations 11/05/2019   Prostatitis    PSVT (paroxysmal supraventricular tachycardia) ([place]) 05/22/2017   Formatting of this note might be different from the original. Prior ablation 2006   Thyroid disease    Hypothyrodidism   Varicose veins     Past Surgical History:  Procedure Laterality Date   ABDOMINAL AORTIC ANEURYSM REPAIR     ABLATION     CAROTID ENDARTERECTOMY  November 21, 2006   Left cea   ENDOVENOUS ABLATION SAPHENOUS VEIN W/ LASER Left 11-28-2012   left greater saphenous vein by Curt Jews MD   ENDOVENOUS ABLATION SAPHENOUS VEIN W/ LASER Right 12-26-2012   right gretaer saphenous vein by Curt Jews MD   INGUINAL HERNIA REPAIR     X's  4   INGUINAL HERNIA REPAIR  09-2011   left side   stab phlebectomy Left 03-13-2013   10-20 incisions by Curt Jews MD  TONSILLECTOMY AND ADENOIDECTOMY      Current Medications: Current Meds  Medication Sig   acyclovir (ZOVIRAX) 800 MG tablet Take 400 mg by mouth 2 (two) times daily.   finasteride (PROSCAR) 5 MG tablet Take 1 tablet by mouth daily.   levothyroxine (SYNTHROID) 150 MCG tablet Take 150 mcg by mouth daily before breakfast.   lisinopril (PRINIVIL,ZESTRIL) 10 MG tablet Take 10 mg by mouth daily. Take 0.5 tablet (5 mg) daily   metoprolol tartrate (LOPRESSOR) 25 MG  tablet Take 12.5 mg by mouth 2 (two) times daily. Takes 0.5 tablet twice a day   omeprazole (PRILOSEC) 20 MG capsule Take 20 mg by mouth daily.   polyethylene glycol powder (GLYCOLAX/MIRALAX) 17 GM/SCOOP powder as needed for mild constipation.   psyllium (METAMUCIL) 58.6 % packet TAKE 1 TABLESPOONFUL BY MOUTH DAILY (MIX IN 8 OUNCES OF WATER OR JUICE AND DRINK)   silodosin (RAPAFLO) 8 MG CAPS capsule Take 8 mg by mouth daily with breakfast.   simvastatin (ZOCOR) 20 MG tablet Take 10 mg by mouth at bedtime.   tadalafil (CIALIS) 5 MG tablet Take 5 mg by mouth daily.   Tamsulosin HCl (FLOMAX) 0.4 MG CAPS Take by mouth. Reported on 01/04/2016   warfarin (COUMADIN) 4 MG tablet TAKE 1/2 TO 1 (ONE-HALF TO ONE) TABLET BY MOUTH ONCE DAILY AS DIRECTED BY  COUMADIN  CLINIC     Allergies:   Digoxin and Other   Social History   Socioeconomic History   Marital status: Divorced    Spouse name: Not on file   Number of children: Not on file   Years of education: Not on file   Highest education level: Not on file  Occupational History   Not on file  Tobacco Use   Smoking status: Former    Types: Cigarettes    Quit date: 09/04/1993    Years since quitting: 27.8   Smokeless tobacco: Never  Vaping Use   Vaping Use: Never used  Substance and Sexual Activity   Alcohol use: Yes    Alcohol/week: 2.0 - 4.0 standard drinks    Types: 2 - 4 Glasses of wine per week   Drug use: No   Sexual activity: Not on file  Other Topics Concern   Not on file  Social History Narrative   Not on file   Social Determinants of Health   Financial Resource Strain: Not on file  Food Insecurity: Not on file  Transportation Needs: Not on file  Physical Activity: Not on file  Stress: Not on file  Social Connections: Not on file     Family History: The patient's family history includes Angina in his mother; Heart disease in his sister; Hypertension in his sister; Stroke in his father. ROS:   Please see the history of  present illness.    All other systems reviewed and are negative.  EKGs/Labs/Other Studies Reviewed:    The following studies were reviewed today:   Recent Labs: 10/27/2020: ALT 11; BUN 14; Creatinine, Ser 0.94; Potassium 4.8; Sodium 134; TSH 0.376  Recent Lipid Panel    Component Value Date/Time   CHOL 115 10/27/2020 1121   TRIG 79 10/27/2020 1121   HDL 40 10/27/2020 1121   CHOLHDL 2.9 10/27/2020 1121   LDLCALC 59 10/27/2020 1121    Physical Exam:    VS:  BP 134/70   Pulse (!) 52   Ht 6' (1.829 m)   Wt 196 lb (88.9 kg)   SpO2 98%   BMI 26.58  kg/m     Wt Readings from Last 3 Encounters:  06/27/21 196 lb (88.9 kg)  05/10/21 194 lb (88 kg)  12/06/20 198 lb 3.2 oz (89.9 kg)     GEN:  Well nourished, well developed in no acute distress HEENT: Normal NECK: No JVD; No carotid bruits LYMPHATICS: No lymphadenopathy CARDIAC: RRR, no murmurs, rubs, gallops RESPIRATORY:  Clear to auscultation without rales, wheezing or rhonchi  ABDOMEN: Soft, non-tender, non-distended MUSCULOSKELETAL:  No edema; No deformity  SKIN: Warm and dry NEUROLOGIC:  Alert and oriented x 3 PSYCHIATRIC:  Normal affect    Signed, Shirlee More, MD  06/27/2021 9:01 AM    [place]

## 2021-06-27 ENCOUNTER — Other Ambulatory Visit: Payer: Self-pay

## 2021-06-27 ENCOUNTER — Ambulatory Visit (INDEPENDENT_AMBULATORY_CARE_PROVIDER_SITE_OTHER): Payer: No Typology Code available for payment source | Admitting: Cardiology

## 2021-06-27 ENCOUNTER — Encounter: Payer: Self-pay | Admitting: Cardiology

## 2021-06-27 VITALS — BP 134/70 | HR 52 | Ht 72.0 in | Wt 196.0 lb

## 2021-06-27 DIAGNOSIS — I119 Hypertensive heart disease without heart failure: Secondary | ICD-10-CM | POA: Diagnosis not present

## 2021-06-27 DIAGNOSIS — E782 Mixed hyperlipidemia: Secondary | ICD-10-CM

## 2021-06-27 DIAGNOSIS — Z9889 Other specified postprocedural states: Secondary | ICD-10-CM | POA: Diagnosis not present

## 2021-06-27 DIAGNOSIS — Z7901 Long term (current) use of anticoagulants: Secondary | ICD-10-CM | POA: Diagnosis not present

## 2021-06-27 DIAGNOSIS — Z23 Encounter for immunization: Secondary | ICD-10-CM | POA: Diagnosis not present

## 2021-06-27 DIAGNOSIS — I480 Paroxysmal atrial fibrillation: Secondary | ICD-10-CM | POA: Diagnosis not present

## 2021-06-27 DIAGNOSIS — Z8719 Personal history of other diseases of the digestive system: Secondary | ICD-10-CM | POA: Diagnosis not present

## 2021-06-27 DIAGNOSIS — R0602 Shortness of breath: Secondary | ICD-10-CM

## 2021-06-27 DIAGNOSIS — M6289 Other specified disorders of muscle: Secondary | ICD-10-CM | POA: Diagnosis not present

## 2021-06-27 NOTE — Addendum Note (Signed)
Addended by: Resa Miner I on: 06/27/2021 09:19 AM   Modules accepted: Orders

## 2021-06-27 NOTE — Patient Instructions (Addendum)
Medication Instructions:  Your physician recommends that you continue on your current medications as directed. Please refer to the Current Medication list given to you today.  *If you need a refill on your cardiac medications before your next appointment, please call your pharmacy*   Lab Work: Your physician recommends that you return for lab work in: [place] If you have labs (blood work) drawn today and your tests are completely normal, you will receive your results only by: [place] (if you have MyChart) OR A paper copy in the mail If you have any lab test that is abnormal or we need to change your treatment, we will call you to review the results.   Testing/Procedures: Your physician has requested that you have an echocardiogram. Echocardiography is a painless test that uses sound waves to create images of your heart. It provides your doctor with information about the size and shape of your heart and how well your heart's chambers and valves are working. This procedure takes approximately one hour. There are no restrictions for this procedure.    Follow-Up: At [facility], you and your health needs are our priority.  As part of our continuing mission to provide you with exceptional heart care, we have created designated Provider Care Teams.  These Care Teams include your primary Cardiologist (physician) and Advanced Practice Providers (APPs -  Physician Assistants and Nurse Practitioners) who all work together to provide you with the care you need, when you need it.  We recommend signing up for the patient portal called "MyChart".  Sign up information is provided on this After Visit Summary.  MyChart is used to connect with patients for Virtual Visits (Telemedicine).  Patients are able to view lab/test results, encounter notes, upcoming appointments, etc.  Non-urgent messages can be sent to your provider as well.   To learn more about what you can do with MyChart, go to  [email or web address].    Your next appointment:   6 month(s)  The format for your next appointment:   In Person  Provider:   Shirlee More, MD   Other Instructions

## 2021-06-28 ENCOUNTER — Telehealth: Payer: Self-pay

## 2021-06-28 LAB — PRO B NATRIURETIC PEPTIDE: NT-Pro BNP: 373 pg/mL (ref 0–486)

## 2021-06-28 LAB — LIPID PANEL
Chol/HDL Ratio: 2.5 ratio (ref 0.0–5.0)
Cholesterol, Total: 103 mg/dL (ref 100–199)
HDL: 41 mg/dL (ref >39)
LDL Chol Calc (NIH): 48 mg/dL (ref 0–99)
Triglycerides: 60 mg/dL (ref 0–149)
VLDL Cholesterol Cal: 14 mg/dL (ref 5–40)

## 2021-06-28 LAB — TSH+T4F+T3FREE
Free T4: 1.69 ng/dL (ref 0.82–1.77)
T3, Free: 2.7 pg/mL (ref 2.0–4.4)
TSH: 0.602 u[IU]/mL (ref 0.450–4.500)

## 2021-06-28 NOTE — Telephone Encounter (Signed)
-----   Message from Richardo Priest, MD sent at 06/28/2021  7:42 AM EDT ----- Normal or stable result  No changes

## 2021-06-28 NOTE — Telephone Encounter (Signed)
Spoke with patient regarding results and recommendation.  Patient verbalizes understanding and is agreeable to plan of care. Advised patient to call back with any issues or concerns.  

## 2021-07-07 ENCOUNTER — Ambulatory Visit (INDEPENDENT_AMBULATORY_CARE_PROVIDER_SITE_OTHER): Payer: No Typology Code available for payment source

## 2021-07-07 ENCOUNTER — Other Ambulatory Visit: Payer: Self-pay

## 2021-07-07 DIAGNOSIS — I480 Paroxysmal atrial fibrillation: Secondary | ICD-10-CM

## 2021-07-07 LAB — ECHOCARDIOGRAM COMPLETE
Area-P 1/2: 2.77 cm2
S' Lateral: 3.8 cm

## 2021-07-11 ENCOUNTER — Telehealth: Payer: Self-pay

## 2021-07-11 NOTE — Telephone Encounter (Signed)
Spoke with patient regarding results and recommendation.  Patient verbalizes understanding and is agreeable to plan of care. Advised patient to call back with any issues or concerns.  

## 2021-07-11 NOTE — Telephone Encounter (Signed)
-----   Message from Richardo Priest, MD sent at 07/08/2021 11:55 AM EDT ----- This is a good result heart muscle function is normal and no findings to show that he has recurrent heart muscle disease or cardiomyopathy.

## 2021-07-13 DIAGNOSIS — Z23 Encounter for immunization: Secondary | ICD-10-CM | POA: Diagnosis not present

## 2021-07-19 ENCOUNTER — Ambulatory Visit (INDEPENDENT_AMBULATORY_CARE_PROVIDER_SITE_OTHER): Payer: No Typology Code available for payment source

## 2021-07-19 ENCOUNTER — Other Ambulatory Visit: Payer: Self-pay

## 2021-07-19 DIAGNOSIS — I4891 Unspecified atrial fibrillation: Secondary | ICD-10-CM | POA: Diagnosis not present

## 2021-07-19 DIAGNOSIS — Z7901 Long term (current) use of anticoagulants: Secondary | ICD-10-CM | POA: Diagnosis not present

## 2021-07-19 DIAGNOSIS — Z5181 Encounter for therapeutic drug level monitoring: Secondary | ICD-10-CM | POA: Diagnosis not present

## 2021-07-19 LAB — POCT INR: INR: 2.1 (ref 2.0–3.0)

## 2021-07-19 NOTE — Patient Instructions (Signed)
Description   Continue taking 1 tablet daily except 1/2 tablet on Friday. INR check in 6 weeks. [phone]

## 2021-08-14 ENCOUNTER — Other Ambulatory Visit: Payer: Self-pay | Admitting: Cardiology

## 2021-08-23 DIAGNOSIS — R1030 Lower abdominal pain, unspecified: Secondary | ICD-10-CM

## 2021-08-23 HISTORY — DX: Lower abdominal pain, unspecified: R10.30

## 2021-08-24 DIAGNOSIS — D485 Neoplasm of uncertain behavior of skin: Secondary | ICD-10-CM | POA: Diagnosis not present

## 2021-08-24 DIAGNOSIS — D2239 Melanocytic nevi of other parts of face: Secondary | ICD-10-CM | POA: Diagnosis not present

## 2021-08-24 DIAGNOSIS — L821 Other seborrheic keratosis: Secondary | ICD-10-CM | POA: Diagnosis not present

## 2021-08-24 DIAGNOSIS — D225 Melanocytic nevi of trunk: Secondary | ICD-10-CM | POA: Diagnosis not present

## 2021-08-30 ENCOUNTER — Ambulatory Visit (INDEPENDENT_AMBULATORY_CARE_PROVIDER_SITE_OTHER): Payer: No Typology Code available for payment source

## 2021-08-30 ENCOUNTER — Other Ambulatory Visit: Payer: Self-pay

## 2021-08-30 DIAGNOSIS — I4891 Unspecified atrial fibrillation: Secondary | ICD-10-CM

## 2021-08-30 DIAGNOSIS — Z7901 Long term (current) use of anticoagulants: Secondary | ICD-10-CM

## 2021-08-30 DIAGNOSIS — Z5181 Encounter for therapeutic drug level monitoring: Secondary | ICD-10-CM | POA: Diagnosis not present

## 2021-08-30 LAB — POCT INR: INR: 2.3 (ref 2.0–3.0)

## 2021-08-30 NOTE — Patient Instructions (Signed)
Description   Continue taking 1 tablet daily except 1/2 tablet on Friday. INR check in 6 weeks. [phone]

## 2021-09-19 DIAGNOSIS — R6889 Other general symptoms and signs: Secondary | ICD-10-CM | POA: Diagnosis not present

## 2021-09-20 ENCOUNTER — Telehealth: Payer: Self-pay | Admitting: *Deleted

## 2021-09-20 ENCOUNTER — Telehealth: Payer: Self-pay

## 2021-09-20 NOTE — Telephone Encounter (Signed)
New Message:    Patient says he have Covid. They him to take Moinupiravin,Antibiotic for Covid. He said they told him to check to see if this was alright for him to take since he is on Coumadin.

## 2021-09-20 NOTE — Telephone Encounter (Signed)
Confirmed with Karren Cobble, PharmD that this medication will not effect INR. Called and let the pt know he should continue taking Warfarin and prescribed medication as instructed. Pt verbalized understanding.

## 2021-09-20 NOTE — Telephone Encounter (Signed)
Did not need this enxounter

## 2021-10-11 ENCOUNTER — Ambulatory Visit (INDEPENDENT_AMBULATORY_CARE_PROVIDER_SITE_OTHER): Payer: No Typology Code available for payment source

## 2021-10-11 ENCOUNTER — Other Ambulatory Visit: Payer: Self-pay

## 2021-10-11 DIAGNOSIS — I4891 Unspecified atrial fibrillation: Secondary | ICD-10-CM

## 2021-10-11 DIAGNOSIS — Z7901 Long term (current) use of anticoagulants: Secondary | ICD-10-CM

## 2021-10-11 DIAGNOSIS — Z5181 Encounter for therapeutic drug level monitoring: Secondary | ICD-10-CM | POA: Diagnosis not present

## 2021-10-11 LAB — POCT INR: INR: 1.8 — AB (ref 2.0–3.0)

## 2021-10-11 NOTE — Patient Instructions (Signed)
Description   Take an extra 0.5 tablet today and then continue taking 1 tablet daily except 1/2 tablet on Friday. INR check in 5 weeks. [phone]

## 2021-11-10 ENCOUNTER — Ambulatory Visit (INDEPENDENT_AMBULATORY_CARE_PROVIDER_SITE_OTHER): Payer: No Typology Code available for payment source | Admitting: Cardiology

## 2021-11-10 ENCOUNTER — Other Ambulatory Visit: Payer: Self-pay

## 2021-11-10 ENCOUNTER — Encounter: Payer: Self-pay | Admitting: Cardiology

## 2021-11-10 VITALS — BP 124/74 | HR 51 | Ht 72.0 in | Wt 195.4 lb

## 2021-11-10 DIAGNOSIS — I480 Paroxysmal atrial fibrillation: Secondary | ICD-10-CM

## 2021-11-10 MED ORDER — MULTAQ 400 MG PO TABS: 400.0000 mg | ORAL_TABLET | Freq: Two times a day (BID) | ORAL | 3 refills | Status: DC

## 2021-11-10 NOTE — Patient Instructions (Signed)
Medication Instructions:  ?Your physician has recommended you make the following change in your medication:  ?START Multaq 400 mg twice daily ? ?*If you need a refill on your cardiac medications before your next appointment, please call your pharmacy* ? ? ?Lab Work: ?None ordered ? ? ?Testing/Procedures: ?None ordered ? ? ?Follow-Up: ?At [facility], you and your health needs are our priority.  As part of our continuing mission to provide you with exceptional heart care, we have created designated Provider Care Teams.  These Care Teams include your primary Cardiologist (physician) and Advanced Practice Providers (APPs -  Physician Assistants and Nurse Practitioners) who all work together to provide you with the care you need, when you need it. ? ? ?Your next appointment:   ?3 month(s) ? ?The format for your next appointment:   ?In Person ? ?Provider:   ?Allegra Lai, MD ? ? ? ?Thank you for choosing [facility]!! ? ? ?Trinidad Curet, RN ?[phone] ? ?Other Instructions ? ? ?Dronedarone tablets ?What is this medication? ?DRONEDARONE (droe NE da rone) is an antiarrhythmic drug. It helps make your heart beat regularly. ?This medicine may be used for other purposes; ask your health care provider or pharmacist if you have questions. ?COMMON BRAND NAME(S): Multaq ?What should I tell my care team before I take this medication? ?They need to know if you have any of these conditions: ?heart failure ?history of irregular heartbeat ?liver disease ?liver or lung problems with the past use of amiodarone ?low levels of magnesium in the blood ?low levels of potassium in the blood ?other heart disease ?an unusual or allergic reaction to dronedarone, other medicines, foods, dyes, or preservatives ?pregnant or trying to get pregnant ?breast-feeding ?How should I use this medication? ?Take this medicine by mouth with a glass of water. Follow the directions on the prescription label. Take one tablet with the morning meal and  one tablet with the evening meal. Do not take your medicine more often than directed. Do not stop taking except on the advice of your doctor or health care professional. ?A special MedGuide will be given to you by the pharmacist with each prescription and refill. Be sure to read this information carefully each time. ?Talk to your pediatrician regarding the use of this medicine in children. Special care may be needed. ?Overdosage: If you think you have taken too much of this medicine contact a poison control center or emergency room at once. ?NOTE: This medicine is only for you. Do not share this medicine with others. ?What if I miss a dose? ?If you miss a dose, take it as soon as you can. If it is almost time for your next dose, take only that dose. Do not take double or extra doses. ?What may interact with this medication? ?Do not take this medicine with any of the following medications: ?arsenic trioxide ?certain antibiotics like clarithromycin, erythromycin, pentamidine, telithromycin, troleandomycin ?certain medicines for depression like tricyclic antidepressants ?certain medicines for fungal infections like fluconazole, itraconazole, ketoconazole, posaconazole, voriconazole ?certain medicines for irregular heart beat like amiodarone, disopyramide, flecainide, ibutilide, quinidine, propafenone, sotalol ?certain medicines for malaria like chloroquine, halofantrine ?cisapride ?cyclosporine ?droperidol ?haloperidol ?methadone ?other medicines that prolong the QT interval (cause an abnormal heart rhythm) like degarelix, encorafenib, entrectinib, eribulin, goserelin, lapatinib ?pimozide ?nefazodone ?phenothiazines like chlorpromazine, mesoridazine, prochlorperazine, thioridazine ?ritonavir ?ziprasidone ?This medicine may also interact with the following medications: ?certain medicines for blood pressure, heart disease, or irregular heart beat like diltiazem, metoprolol, propranolol, verapamil ?certain medicines  for  cholesterol like atorvastatin, lovastatin, simvastatin ?certain medicines for seizures like carbamazepine, phenobarbital, phenytoin ?digoxin ?dofetilide ?grapefruit juice ?rifampin ?sirolimus ?St. John's Wort ?tacrolimus ?This list may not describe all possible interactions. Give your health care provider a list of all the medicines, herbs, non-prescription drugs, or dietary supplements you use. Also tell them if you smoke, drink alcohol, or use illegal drugs. Some items may interact with your medicine. ?What should I watch for while using this medication? ?Your condition will be monitored closely when you first begin therapy. Often, this drug is first started in a hospital or other monitored health care setting. Once you are on maintenance therapy, visit your doctor or health care professional for regular checks on your progress. Because your condition and use of this medicine carry some risk, it is a good idea to carry an identification card, necklace or bracelet with details of your condition, medications, and doctor or health care professional. ?You may get drowsy or dizzy. Do not drive, use machinery, or do anything that needs mental alertness until you know how this medicine affects you. Do not stand or sit up quickly, especially if you are an older patient. This reduces the risk of dizzy or fainting spells. ?What side effects may I notice from receiving this medication? ?Side effects that you should report to your doctor or health care professional as soon as possible: ?allergic reactions like skin rash, itching or hives, swelling of the face, lips, or tongue ?breathing problems ?cough ?dark urine ?fast, irregular heartbeat ?general ill feeling or flu-like symptoms ?light-colored stools ?loss of appetite, nausea ?right upper belly pain ?slow heartbeat ?stomach pain ?swelling of the legs or ankles ?unusually weak or tired ?weight gain ?yellowing of the eyes or skin ?Side effects that usually do not require  medical attention (report to your doctor or health care professional if they continue or are bothersome): ?nausea ?vomiting ?This list may not describe all possible side effects. Call your doctor for medical advice about side effects. You may report side effects to FDA at 1-800-FDA-1088. ?Where should I keep my medication? ?Keep out of the reach of children. ?Store at room temperature between 15 and 30 degrees C (59 and 86 degrees F). Throw away any unused medicine after the expiration date. ?NOTE: This sheet is a summary. It may not cover all possible information. If you have questions about this medicine, talk to your doctor, pharmacist, or health care provider. ?? 2022 Elsevier/Gold Standard (2021-05-10 00:00:00) ? ?  ?

## 2021-11-10 NOTE — Progress Notes (Signed)
? ?Electrophysiology Office Note ? ? ?Date:  11/10/2021  ? ?ID:  Allen [name], DOB [birth year], MRN [id number] ? ?PCP:  Street, [name], MD  ?Cardiologist:  Bettina Gavia ?Primary Electrophysiologist:  Rusti Arizmendi Meredith Leeds, MD   ? ?Chief Complaint: AF ?  ?History of Present Illness: ?Allen [name] is a 81 y.o. male who is being seen today for the evaluation of AF at the request of Street, [name], *. Presenting today for electrophysiology evaluation. ? ?He has a history significant for atrial fibrillation, cryptogenic stroke, hypertension, SVT status post ablation.  He has an abdominal aortic aneurysm status post EVAR.  He has had multiple episodes where he perceives he has had atrial fibrillation.  He had an ablation several years ago for atrial fibrillation or SVT.  He had palpitations on a daily basis.  He wore a cardiac monitor that showed sinus rhythm with short runs of SVT and atrial ectopy.  He is now status post Linq monitor implant.  He does state that he had a stroke during one of his ablations. ? ?Today, denies symptoms of palpitations, chest pain, shortness of breath, orthopnea, PND, lower extremity edema, claudication, dizziness, presyncope, syncope, bleeding, or neurologic sequela. The patient is tolerating medications without difficulties.  He continues to have episodic palpitations.  He states that he feels that he has a 20 to 30% burden of atrial fibrillation.  This is confirmed on his Linq monitor.  He feels flushed when he has his arrhythmia.  He would like to remain in normal rhythm. ? ? ?Past Medical History:  ?Diagnosis Date  ? AAA (abdominal aortic aneurysm)   ? Atrial fibrillation ([place])   ? Cardiomyopathy, nonischemic ([place]) 10/28/2018  ? CHF (congestive heart failure) ([place])   ? CVA (cerebral infarction) 1969  ? Hypertension   ? Irregular heart beat   ? Malaise and fatigue 11/05/2019  ? Murmur 11/05/2019  ? Palpitations 11/05/2019  ? Prostatitis   ? PSVT (paroxysmal supraventricular  tachycardia) ([place]) 05/22/2017  ? Formatting of this note might be different from the original. Prior ablation 2006  ? Thyroid disease   ? Hypothyrodidism  ? Varicose veins   ? ?Past Surgical History:  ?Procedure Laterality Date  ? ABDOMINAL AORTIC ANEURYSM REPAIR    ? ABLATION    ? CAROTID ENDARTERECTOMY  November 21, 2006  ? Left cea  ? ENDOVENOUS ABLATION SAPHENOUS VEIN W/ LASER Left 11-28-2012  ? left greater saphenous vein by Curt Jews MD  ? ENDOVENOUS ABLATION SAPHENOUS VEIN W/ LASER Right 12-26-2012  ? right gretaer saphenous vein by Curt Jews MD  ? INGUINAL HERNIA REPAIR    ? X's  4  ? INGUINAL HERNIA REPAIR  09-2011  ? left side  ? stab phlebectomy Left 03-13-2013  ? 10-20 incisions by Curt Jews MD  ? TONSILLECTOMY AND ADENOIDECTOMY    ? ? ? ?Current Outpatient Medications  ?Medication Sig Dispense Refill  ? acyclovir (ZOVIRAX) 800 MG tablet Take 400 mg by mouth 2 (two) times daily.    ? dronedarone (MULTAQ) 400 MG tablet Take 1 tablet (400 mg total) by mouth 2 (two) times daily with a meal. 60 tablet 3  ? finasteride (PROSCAR) 5 MG tablet Take 1 tablet by mouth daily.    ? levothyroxine (SYNTHROID) 150 MCG tablet Take 150 mcg by mouth daily before breakfast.    ? lisinopril (PRINIVIL,ZESTRIL) 10 MG tablet Take 0.5 tablet (5 mg) by mouth daily    ? metoprolol tartrate (LOPRESSOR) 25 MG tablet  Take 12.5 mg by mouth 2 (two) times daily. Takes 0.5 tablet twice a day    ? omeprazole (PRILOSEC) 20 MG capsule Take 20 mg by mouth daily.    ? polyethylene glycol powder (GLYCOLAX/MIRALAX) 17 GM/SCOOP powder as needed for mild constipation.    ? PRESCRIPTION MEDICATION Take 1 capsule by mouth daily. Generic for Flomax (not Tamsulosin)    ? psyllium (METAMUCIL) 58.6 % packet TAKE 1 TABLESPOONFUL BY MOUTH DAILY (MIX IN 8 OUNCES OF WATER OR JUICE AND DRINK)    ? silodosin (RAPAFLO) 8 MG CAPS capsule Take 8 mg by mouth daily with breakfast.    ? simvastatin (ZOCOR) 20 MG tablet Take 10 mg by mouth at bedtime.    ?  tadalafil (CIALIS) 5 MG tablet Take 5 mg by mouth daily.    ? Tamsulosin HCl (FLOMAX) 0.4 MG CAPS Take by mouth. Reported on 01/04/2016    ? warfarin (COUMADIN) 4 MG tablet TAKE 1/2 TO 1 (ONE-HALF TO ONE) TABLET BY MOUTH ONCE DAILY AS DIRECTED BY  COUMADIN  CLINIC 90 tablet 0  ? ?No current facility-administered medications for this visit.  ? ? ?Allergies:   Digoxin and Other  ? ?Social History:  The patient  reports that he quit smoking about 28 years ago. His smoking use included cigarettes. He has never used smokeless tobacco. He reports current alcohol use of about 2.0 - 4.0 standard drinks per week. He reports that he does not use drugs.  ? ?Family History:  The patient's family history includes Angina in his mother; Heart disease in his sister; Hypertension in his sister; Stroke in his father.  ? ?ROS:  Please see the history of present illness.   Otherwise, review of systems is positive for none.   All other systems are reviewed and negative.  ? ?PHYSICAL EXAM: ?VS:  BP 124/74   Pulse (!) 51   Ht 6' (1.829 m)   Wt 195 lb 6.4 oz (88.6 kg)   SpO2 98%   BMI 26.50 kg/m?  , BMI Body mass index is 26.5 kg/m?. ?GEN: Well nourished, well developed, in no acute distress  ?HEENT: normal  ?Neck: no JVD, carotid bruits, or masses ?Cardiac: RRR; no murmurs, rubs, or gallops,no edema  ?Respiratory:  clear to auscultation bilaterally, normal work of breathing ?GI: soft, nontender, nondistended, + BS ?MS: no deformity or atrophy  ?Skin: warm and dry, device site well healed ?Neuro:  Strength and sensation are intact ?Psych: euthymic mood, full affect ? ?EKG:  EKG is ordered today. ?Personal review of the ekg ordered shows sinus rhythm ? ?Personal review of the device interrogation today. Results in [place]  ? ?Recent Labs: ?06/27/2021: NT-Pro BNP 373; TSH 0.602  ? ? ?Lipid Panel  ?   ?Component Value Date/Time  ? CHOL 103 06/27/2021 0922  ? TRIG 60 06/27/2021 0922  ? HDL 41 06/27/2021 0922  ? CHOLHDL 2.5 06/27/2021 9371   ? [place] 48 06/27/2021 0922  ? ? ? ?Wt Readings from Last 3 Encounters:  ?11/10/21 195 lb 6.4 oz (88.6 kg)  ?06/27/21 196 lb (88.9 kg)  ?05/10/21 194 lb (88 kg)  ?  ? ? ?Other studies Reviewed: ?Additional studies/ records that were reviewed today include: Cardiac monitor 03/14/20 personally reviewed  ?Review of the above records today demonstrates:  ?Conclusion, rare ventricular ectopy occasional supraventricular ectopy and triggered and diary events associated with APCs and PVCs. ? ? ? ?ASSESSMENT AND PLAN: ? ?1.  Paroxysmal atrial fibrillation: CHA2DS2-VASc of 6.  Currently on warfarin.  Status post Linq monitor implant.  He is continue to have episodes of atrial fibrillation.  He is symptomatic from these.  Due to that we Alexiya Franqui start him on Multaq. ? ?2.  Hypertension: Currently well controlled ? ?3.  Hyperlipidemia: Continue statin per primary cardiology ? ?Current medicines are reviewed at length with the patient today.   ?The patient does not have concerns regarding his medicines.  The following changes were made today: Multaq 400 mg twice daily ? ?Labs/ tests ordered today include:  ?Orders Placed This Encounter  ?Procedures  ? EKG 12-Lead  ? ? ? ? ?Disposition:   FU with Donn Zanetti 3 months ? ?Signed, ?Christopherjohn Schiele Meredith Leeds, MD  ?11/10/2021 2:22 PM    ? ?[facility] ?[address] ?[address] ?[place] [address] ?[phone] (office) ?[phone] ? ? ?

## 2021-11-11 ENCOUNTER — Telehealth: Payer: Self-pay | Admitting: Cardiology

## 2021-11-11 NOTE — Telephone Encounter (Signed)
Pt c/o medication issue: ? ?1. Name of Medication: dronedarone (MULTAQ) 400 MG tablet ? ?2. How are you currently taking this medication (dosage and times per day)? Patient has not started taking yet ? ?3. Are you having a reaction (difficulty breathing--STAT)? no ? ?4. What is your medication issue? Patient was told at his appointment yesterday to find out how much the medication would cost after his insurance. Sherri offered to help him enroll in a patient assistance program. ? ?He wanted to let her know that the medication would cost him $140 for a 90 day supply. ? ?He also wanted to let Venida Jarvis know that he will start taking the medication on Monday. He wants to wait until the antibiotics he is on are out of his system.  ?

## 2021-11-14 ENCOUNTER — Encounter: Payer: Self-pay | Admitting: Cardiology

## 2021-11-15 ENCOUNTER — Other Ambulatory Visit: Payer: Self-pay

## 2021-11-15 ENCOUNTER — Ambulatory Visit (INDEPENDENT_AMBULATORY_CARE_PROVIDER_SITE_OTHER): Payer: No Typology Code available for payment source

## 2021-11-15 DIAGNOSIS — Z7901 Long term (current) use of anticoagulants: Secondary | ICD-10-CM | POA: Diagnosis not present

## 2021-11-15 DIAGNOSIS — I4891 Unspecified atrial fibrillation: Secondary | ICD-10-CM

## 2021-11-15 LAB — POCT INR: INR: 1.9 — AB (ref 2.0–3.0)

## 2021-11-15 NOTE — Patient Instructions (Signed)
Description   ?Take 1.5 tablets today and then continue taking 1 tablet daily except 1/2 tablet on Friday.  ?INR check in 3 weeks. [phone] ?  ?   ?

## 2021-11-21 ENCOUNTER — Encounter: Payer: Self-pay | Admitting: Cardiology

## 2021-11-24 ENCOUNTER — Other Ambulatory Visit: Payer: Self-pay | Admitting: Cardiology

## 2021-12-02 DIAGNOSIS — I10 Essential (primary) hypertension: Secondary | ICD-10-CM | POA: Diagnosis not present

## 2021-12-02 DIAGNOSIS — E785 Hyperlipidemia, unspecified: Secondary | ICD-10-CM | POA: Diagnosis not present

## 2021-12-06 ENCOUNTER — Ambulatory Visit (INDEPENDENT_AMBULATORY_CARE_PROVIDER_SITE_OTHER): Payer: No Typology Code available for payment source

## 2021-12-06 DIAGNOSIS — I4891 Unspecified atrial fibrillation: Secondary | ICD-10-CM | POA: Diagnosis not present

## 2021-12-06 DIAGNOSIS — Z7901 Long term (current) use of anticoagulants: Secondary | ICD-10-CM | POA: Diagnosis not present

## 2021-12-06 LAB — POCT INR: INR: 1.7 — AB (ref 2.0–3.0)

## 2021-12-06 NOTE — Patient Instructions (Signed)
Description   ?Take 1.5 tablets today and then START taking 1 tablet daily.  ?INR check in 2 weeks. [phone] ?  ?   ?

## 2021-12-20 ENCOUNTER — Ambulatory Visit (INDEPENDENT_AMBULATORY_CARE_PROVIDER_SITE_OTHER): Payer: No Typology Code available for payment source

## 2021-12-20 DIAGNOSIS — Z7901 Long term (current) use of anticoagulants: Secondary | ICD-10-CM | POA: Diagnosis not present

## 2021-12-20 DIAGNOSIS — I4891 Unspecified atrial fibrillation: Secondary | ICD-10-CM | POA: Diagnosis not present

## 2021-12-20 LAB — POCT INR: INR: 2.1 (ref 2.0–3.0)

## 2021-12-20 NOTE — Patient Instructions (Signed)
Description   ?Continue taking 1 tablet daily.  ?INR check in 4 weeks. [phone] ?  ?   ?

## 2021-12-25 NOTE — Progress Notes (Signed)
?Cardiology Office Note:   ? ?Date:  12/26/2021  ? ?ID:  Allen [name], DOB Jan 31, 1941, MRN [id number] ? ?PCP:  Street, [name], MD  ?Cardiologist:  Shirlee More, MD   ? ?Referring MD: Street, [name], *  ? ? ?ASSESSMENT:   ? ?1. Paroxysmal atrial fibrillation (HCC)   ?2. Chronic anticoagulation   ?3. Hypertensive heart disease without heart failure   ?4. Mixed hyperlipidemia   ? ?PLAN:   ? ?In order of problems listed above: ? ?Despite a reassuring echocardiogram he has persistent exercise intolerance exertional shortness of breath which seems disproportionate to his atrial arrhythmia at risk for CAD we will go ahead and check myocardial perfusion study stress my office. ?Increase Toprol-XL 50 mg daily continue his anticoagulation ?Ejection fraction is normalized no evidence of heart failure ?Continue his statin check labs today including lipids ? ? ?Next appointment: 6 months ? ? ?Medication Adjustments/Labs and Tests Ordered: ?Current medicines are reviewed at length with the patient today.  Concerns regarding medicines are outlined above.  ?No orders of the defined types were placed in this encounter. ? ?No orders of the defined types were placed in this encounter. ? ? ?Chief Complaint  ?Patient presents with  ? Follow-up  ? Atrial Fibrillation  ? Cardiomyopathy  ? ? ?History of Present Illness:   ? ?Allen [name] is a 81 y.o. male with a hx of nonischemic cardiomyopathy with normalization of left ventricular ejection fraction SVT with EP catheter ablation paroxysmal atrial fibrillation with EP catheter ablation previous stroke with long-term anticoagulation hypertensive heart disease peripheral arterial disease with EVAR for abdominal aortic aneurysm and hypothyroidism .  With ongoing symptoms of atrial fibrillation was referred for EP evaluation Dr. Curt Bears 05/10/2021 and had a implanted loop recorder. He was last seen 06/27/2021. ? ?Component Ref Range & Units 6 d ago 2 wk ago 1 mo ago 2 mo ago  3 mo ago 5 mo ago 6 mo ago  ?INR 2.0 - 3.0 2.1  1.7 Abnormal   1.9 Abnormal   1.8 Abnormal   2.3  2.1  3.1 Abnormal    ? ?Compliance with diet, lifestyle and medications: Yes ? ?Allen [name] is very frustrated today ?He feels communication has not been good ?He took dronedarone felt worse and exertional shortness of breath stopped the drug and continues to feel worse ?His strength and endurance are diminished ?He is not having chest pain edema orthopnea he is short of breath when he walks has to stop and rest somewhat anginal in nature ?He does not know how frequently he is having atrial fibrillation and he asked me repeatedly why the results of this loop recorder download are not available for him to review ? ?An echocardiogram performed 07/07/2021: ?He had mild concentric LVH normal left ventricular volume normal left ventricular systolic function EF 60 to 65% and grade 2 diastolic dysfunction.  Right ventricle is normal in size function and pulmonary artery pressure.  Mild mitral regurgitation was seen. ? 1. GLS -15.6. Left ventricular ejection fraction, by estimation, is 60 to  ?65%. The left ventricle has normal function. The left ventricle has no  ?regional wall motion abnormalities. The left ventricular internal cavity  ?size was mildly dilated. There is  ?mild left ventricular hypertrophy. Left ventricular diastolic parameters  ?are consistent with Grade II diastolic dysfunction (pseudonormalization).  ? 2. Right ventricular systolic function is normal. The right ventricular  ?size is normal. There is normal pulmonary artery systolic pressure.  ? 3. The  mitral valve is normal in structure. Mild mitral valve  ?regurgitation. No evidence of mitral stenosis.  ? 4. The aortic valve is normal in structure. Aortic valve regurgitation is  ?mild. No aortic stenosis is present.  ? 5. There is mild dilatation of the ascending aorta, measuring 39 mm.  ? 6. The inferior vena cava is normal in size with greater than 50%   ?respiratory variability, suggesting right atrial pressure of 3 mmHg.  ?Past Medical History:  ?Diagnosis Date  ? AAA (abdominal aortic aneurysm) ([place])   ? Atrial fibrillation ([place])   ? Cardiomyopathy, nonischemic ([place]) 10/28/2018  ? CHF (congestive heart failure) ([place])   ? CVA (cerebral infarction) 1969  ? Hypertension   ? Irregular heart beat   ? Malaise and fatigue 11/05/2019  ? Murmur 11/05/2019  ? Palpitations 11/05/2019  ? Prostatitis   ? PSVT (paroxysmal supraventricular tachycardia) ([place]) 05/22/2017  ? Formatting of this note might be different from the original. Prior ablation 2006  ? Thyroid disease   ? Hypothyrodidism  ? Varicose veins   ? ? ?Past Surgical History:  ?Procedure Laterality Date  ? ABDOMINAL AORTIC ANEURYSM REPAIR    ? ABLATION    ? CAROTID ENDARTERECTOMY  November 21, 2006  ? Left cea  ? ENDOVENOUS ABLATION SAPHENOUS VEIN W/ LASER Left 11-28-2012  ? left greater saphenous vein by Curt Jews MD  ? ENDOVENOUS ABLATION SAPHENOUS VEIN W/ LASER Right 12-26-2012  ? right gretaer saphenous vein by Curt Jews MD  ? INGUINAL HERNIA REPAIR    ? X's  4  ? INGUINAL HERNIA REPAIR  09-2011  ? left side  ? stab phlebectomy Left 03-13-2013  ? 10-20 incisions by Curt Jews MD  ? TONSILLECTOMY AND ADENOIDECTOMY    ? ? ?Current Medications: ?Current Meds  ?Medication Sig  ? acyclovir (ZOVIRAX) 800 MG tablet Take 400 mg by mouth 2 (two) times daily.  ? finasteride (PROSCAR) 5 MG tablet Take 1 tablet by mouth daily.  ? levothyroxine (SYNTHROID) 150 MCG tablet Take 150 mcg by mouth daily before breakfast.  ? lisinopril (PRINIVIL,ZESTRIL) 10 MG tablet Take 0.5 tablet (5 mg) by mouth daily  ? metoprolol tartrate (LOPRESSOR) 25 MG tablet Take 12.5 mg by mouth 2 (two) times daily. Takes 0.5 tablet twice a day  ? omeprazole (PRILOSEC) 20 MG capsule Take 20 mg by mouth daily.  ? polyethylene glycol powder (GLYCOLAX/MIRALAX) 17 GM/SCOOP powder as needed for mild constipation.  ? PRESCRIPTION MEDICATION Take 1 capsule by mouth  daily. Generic for Flomax (not Tamsulosin)  ? psyllium (METAMUCIL) 58.6 % packet TAKE 1 TABLESPOONFUL BY MOUTH DAILY (MIX IN 8 OUNCES OF WATER OR JUICE AND DRINK)  ? silodosin (RAPAFLO) 8 MG CAPS capsule Take 8 mg by mouth daily with breakfast.  ? simvastatin (ZOCOR) 20 MG tablet Take 10 mg by mouth at bedtime.  ? tadalafil (CIALIS) 5 MG tablet Take 5 mg by mouth daily.  ? Tamsulosin HCl (FLOMAX) 0.4 MG CAPS Take by mouth. Reported on 01/04/2016  ? warfarin (COUMADIN) 4 MG tablet TAKE 1/2 TO 1 (ONE-HALF TO ONE) TABLET BY MOUTH ONCE DAILY AS DIRECTED BY  COUMADIN  CLINIC  ?  ? ?Allergies:   Digoxin and Other  ? ?Social History  ? ?Socioeconomic History  ? Marital status: Divorced  ?  Spouse name: Not on file  ? Number of children: Not on file  ? Years of education: Not on file  ? Highest education level: Not on file  ?Occupational History  ?  Not on file  ?Tobacco Use  ? Smoking status: Former  ?  Types: Cigarettes  ?  Quit date: 09/04/1993  ?  Years since quitting: 28.3  ?  Passive exposure: Past  ? Smokeless tobacco: Never  ?Vaping Use  ? Vaping Use: Never used  ?Substance and Sexual Activity  ? Alcohol use: Yes  ?  Alcohol/week: 2.0 - 4.0 standard drinks  ?  Types: 2 - 4 Glasses of wine per week  ? Drug use: No  ? Sexual activity: Not on file  ?Other Topics Concern  ? Not on file  ?Social History Narrative  ? Not on file  ? ?Social Determinants of Health  ? ?Financial Resource Strain: Not on file  ?Food Insecurity: Not on file  ?Transportation Needs: Not on file  ?Physical Activity: Not on file  ?Stress: Not on file  ?Social Connections: Not on file  ?  ? ?Family History: ?The patient's family history includes Angina in his mother; Heart disease in his sister; Hypertension in his sister; Stroke in his father. ?ROS:   ?Please see the history of present illness.    ?All other systems reviewed and are negative. ? ?EKGs/Labs/Other Studies Reviewed:   ? ?The following studies were reviewed today: ? ? ?Recent  Labs: ?06/27/2021: NT-Pro BNP 373; TSH 0.602  ?Recent Lipid Panel ?   ?Component Value Date/Time  ? CHOL 103 06/27/2021 0922  ? TRIG 60 06/27/2021 0922  ? HDL 41 06/27/2021 0922  ? CHOLHDL 2.5 06/27/2021 7654  ? LDLCALC 48 10

## 2021-12-26 ENCOUNTER — Ambulatory Visit (INDEPENDENT_AMBULATORY_CARE_PROVIDER_SITE_OTHER): Payer: No Typology Code available for payment source | Admitting: Cardiology

## 2021-12-26 ENCOUNTER — Encounter: Payer: Self-pay | Admitting: Cardiology

## 2021-12-26 ENCOUNTER — Other Ambulatory Visit: Payer: Self-pay

## 2021-12-26 VITALS — BP 144/68 | HR 56 | Ht 72.0 in | Wt 196.6 lb

## 2021-12-26 DIAGNOSIS — I480 Paroxysmal atrial fibrillation: Secondary | ICD-10-CM

## 2021-12-26 DIAGNOSIS — E782 Mixed hyperlipidemia: Secondary | ICD-10-CM

## 2021-12-26 DIAGNOSIS — Z7901 Long term (current) use of anticoagulants: Secondary | ICD-10-CM

## 2021-12-26 DIAGNOSIS — I119 Hypertensive heart disease without heart failure: Secondary | ICD-10-CM

## 2021-12-26 DIAGNOSIS — I428 Other cardiomyopathies: Secondary | ICD-10-CM

## 2021-12-26 NOTE — Progress Notes (Unsigned)
[id number] ?

## 2021-12-26 NOTE — Patient Instructions (Addendum)
Medication Instructions:  ?Your physician recommends that you continue on your current medications as directed. Please refer to the Current Medication list given to you today. ? ?*If you need a refill on your cardiac medications before your next appointment, please call your pharmacy* ? ? ?Lab Work: ?Your physician recommends that you return for lab work in:  ? ?Labs today: CMP, Lipids, TSH, T4, Free T3, CBC ? ?If you have labs (blood work) drawn today and your tests are completely normal, you will receive your results only by: ?MyChart Message (if you have MyChart) OR ?A paper copy in the mail ?If you have any lab test that is abnormal or we need to change your treatment, we will call you to review the results. ? ? ?Testing/Procedures: ? ? ?[facility] ?[address] ?[place], [place] [address] ?Phone:  [phone] ? ? ? ?Please arrive 15 minutes prior to your appointment time for registration and insurance purposes. ? ?The test will take approximately 3 to 4 hours to complete; you may bring reading material.  If someone comes with you to your appointment, they will need to remain in the main lobby due to limited space in the testing area. **If you are pregnant or breastfeeding, please notify the nuclear lab prior to your appointment** ? ?How to prepare for your Myocardial Perfusion Test: ?Do not eat or drink 3 hours prior to your test, except you may have water. ?Do not consume products containing caffeine (regular or decaffeinated) 12 hours prior to your test. (ex: coffee, chocolate, sodas, tea). ?Do bring a list of your current medications with you.  If not listed below, you may take your medications as normal. ?HOLD Erectile dysfunction medication: Cialis ?Do wear comfortable clothes (no dresses or overalls) and walking shoes, tennis shoes preferred (No heels or open toe shoes are allowed). ?Do NOT wear cologne, perfume, aftershave, or lotions (deodorant is allowed). ?If these  instructions are not followed, your test will have to be rescheduled. ? ?Please report to [address] for your test.  If you have questions or concerns about your appointment, you can call the [facility] at [phone]. ? ?If you cannot keep your appointment, please provide 24 hours notification to the Nuclear Lab, to avoid a possible $50 charge to your account. ? ? ? ?Follow-Up: ?At [facility], you and your health needs are our priority.  As part of our continuing mission to provide you with exceptional heart care, we have created designated Provider Care Teams.  These Care Teams include your primary Cardiologist (physician) and Advanced Practice Providers (APPs -  Physician Assistants and Nurse Practitioners) who all work together to provide you with the care you need, when you need it. ? ?We recommend signing up for the patient portal called "MyChart".  Sign up information is provided on this After Visit Summary.  MyChart is used to connect with patients for Virtual Visits (Telemedicine).  Patients are able to view lab/test results, encounter notes, upcoming appointments, etc.  Non-urgent messages can be sent to your provider as well.   ?To learn more about what you can do with MyChart, go to [email or web address].   ? ?Your next appointment:   ?6 month(s) ? ?The format for your next appointment:   ?In Person ? ?Provider:   ?Shirlee More, MD  ? ? ?Other Instructions ?None ? ?Important Information About Sugar ? ? ? ? ? ? ?

## 2021-12-27 ENCOUNTER — Telehealth: Payer: Self-pay | Admitting: *Deleted

## 2021-12-27 ENCOUNTER — Encounter: Payer: Self-pay | Admitting: *Deleted

## 2021-12-27 LAB — COMPREHENSIVE METABOLIC PANEL
ALT: 12 IU/L (ref 0–44)
AST: 18 IU/L (ref 0–40)
Albumin/Globulin Ratio: 1.8 (ref 1.2–2.2)
Albumin: 4.2 g/dL (ref 3.7–4.7)
Alkaline Phosphatase: 66 IU/L (ref 44–121)
BUN/Creatinine Ratio: 12 (ref 10–24)
BUN: 12 mg/dL (ref 8–27)
Bilirubin Total: 0.8 mg/dL (ref 0.0–1.2)
CO2: 23 mmol/L (ref 20–29)
Calcium: 8.8 mg/dL (ref 8.6–10.2)
Chloride: 101 mmol/L (ref 96–106)
Creatinine, Ser: 1.01 mg/dL (ref 0.76–1.27)
Globulin, Total: 2.3 g/dL (ref 1.5–4.5)
Glucose: 92 mg/dL (ref 70–99)
Potassium: 4.8 mmol/L (ref 3.5–5.2)
Sodium: 136 mmol/L (ref 134–144)
Total Protein: 6.5 g/dL (ref 6.0–8.5)
eGFR: 76 mL/min/{1.73_m2} (ref >59)

## 2021-12-27 LAB — TSH+T4F+T3FREE
Free T4: 1.94 ng/dL — ABNORMAL HIGH (ref 0.82–1.77)
T3, Free: 3 pg/mL (ref 2.0–4.4)
TSH: 0.547 u[IU]/mL (ref 0.450–4.500)

## 2021-12-27 LAB — CBC
Hematocrit: 38.9 % (ref 37.5–51.0)
Hemoglobin: 13.4 g/dL (ref 13.0–17.7)
MCH: 32.1 pg (ref 26.6–33.0)
MCHC: 34.4 g/dL (ref 31.5–35.7)
MCV: 93 fL (ref 79–97)
Platelets: 180 10*3/uL (ref 150–450)
RBC: 4.18 x10E6/uL (ref 4.14–5.80)
RDW: 12.6 % (ref 11.6–15.4)
WBC: 4.9 10*3/uL (ref 3.4–10.8)

## 2021-12-27 LAB — LIPID PANEL
Chol/HDL Ratio: 2.5 ratio (ref 0.0–5.0)
Cholesterol, Total: 107 mg/dL (ref 100–199)
HDL: 43 mg/dL (ref >39)
LDL Chol Calc (NIH): 50 mg/dL (ref 0–99)
Triglycerides: 64 mg/dL (ref 0–149)
VLDL Cholesterol Cal: 14 mg/dL (ref 5–40)

## 2021-12-27 NOTE — Telephone Encounter (Signed)
Left message on voicemail per DPR in reference to upcoming appointment scheduled on 01/03/22 at 1100 with detailed instructions given per Myocardial Perfusion Study Information Sheet for the test. LM to arrive 15 minutes early, and that it is imperative to arrive on time for appointment to keep from having the test rescheduled. If you need to cancel or reschedule your appointment, please call the office within 24 hours of your appointment. Failure to do so may result in a cancellation of your appointment, and a $50 no show fee. Phone number given for call back for any questions. Mychart letter sent.Allen [name], [name] ? ? ?

## 2021-12-28 ENCOUNTER — Telehealth: Payer: Self-pay

## 2021-12-28 NOTE — Telephone Encounter (Signed)
Successful telephone encounter to patient to assess and trouble shoot home remote monitoring for Linq2 as patient has not transmitted since 06/12/21. Patient states he received a letter reminding him to transmit however he never received a call back from clinic. Patient unable to follow verbal instructions for turning on (iphone-remote monitor). Appointment made for [place] device clinic 01/09/22 at 8am. Patient is instructed to bring monitor/phone and charging cord to appointment.  ?

## 2021-12-28 NOTE — Telephone Encounter (Signed)
-----   Message from Richardo Priest, MD sent at 12/27/2021  7:50 AM EDT ----- ?Normal or stable result including thyroid I would not change his dose ?

## 2021-12-28 NOTE — Telephone Encounter (Signed)
Patient notified of results.

## 2021-12-29 ENCOUNTER — Other Ambulatory Visit: Payer: Self-pay

## 2021-12-29 NOTE — Addendum Note (Signed)
Addended byShirlee More on: 12/29/2021 02:35 PM ? ? Modules accepted: Orders ? ?

## 2022-01-03 ENCOUNTER — Ambulatory Visit (INDEPENDENT_AMBULATORY_CARE_PROVIDER_SITE_OTHER): Payer: No Typology Code available for payment source

## 2022-01-03 DIAGNOSIS — I480 Paroxysmal atrial fibrillation: Secondary | ICD-10-CM

## 2022-01-03 DIAGNOSIS — I119 Hypertensive heart disease without heart failure: Secondary | ICD-10-CM

## 2022-01-03 DIAGNOSIS — Z7901 Long term (current) use of anticoagulants: Secondary | ICD-10-CM | POA: Diagnosis not present

## 2022-01-03 DIAGNOSIS — E782 Mixed hyperlipidemia: Secondary | ICD-10-CM | POA: Diagnosis not present

## 2022-01-03 LAB — MYOCARDIAL PERFUSION IMAGING
Estimated workload: 7
Exercise duration (min): 6 min
Exercise duration (sec): 49 s
LV dias vol: 119 mL (ref 62–150)
LV sys vol: 53 mL
MPHR: 141 {beats}/min
Nuc Stress EF: 56 %
Peak HR: 121 {beats}/min
Percent HR: 85 %
Rest HR: 50 {beats}/min
Rest Nuclear Isotope Dose: 10.5 mCi
SDS: 0
SRS: 1
SSS: 1
Stress Nuclear Isotope Dose: 31.3 mCi
TID: 1.07

## 2022-01-03 MED ORDER — TECHNETIUM TC 99M TETROFOSMIN IV KIT
31.3000 | PACK | Freq: Once | INTRAVENOUS | Status: AC | PRN
Start: 2022-01-03 — End: 2022-01-03
  Administered 2022-01-03: 31.3 via INTRAVENOUS

## 2022-01-03 MED ORDER — TECHNETIUM TC 99M TETROFOSMIN IV KIT
10.5000 | PACK | Freq: Once | INTRAVENOUS | Status: AC | PRN
  Administered 2022-01-03: 10.5 via INTRAVENOUS

## 2022-01-03 MED ORDER — TECHNETIUM TC 99M TETROFOSMIN IV KIT
1235.0000 | PACK | Freq: Once | INTRAVENOUS | Status: DC | PRN
Start: 2022-01-03 — End: 2022-01-03

## 2022-01-09 ENCOUNTER — Ambulatory Visit (INDEPENDENT_AMBULATORY_CARE_PROVIDER_SITE_OTHER): Payer: No Typology Code available for payment source | Admitting: Cardiology

## 2022-01-09 ENCOUNTER — Ambulatory Visit (INDEPENDENT_AMBULATORY_CARE_PROVIDER_SITE_OTHER): Payer: No Typology Code available for payment source

## 2022-01-09 DIAGNOSIS — I428 Other cardiomyopathies: Secondary | ICD-10-CM | POA: Diagnosis not present

## 2022-01-09 DIAGNOSIS — I480 Paroxysmal atrial fibrillation: Secondary | ICD-10-CM

## 2022-01-09 NOTE — Patient Instructions (Signed)
Please call the [facility] with any questions or concerns about your remote monitor at [phone] ?

## 2022-01-09 NOTE — Progress Notes (Signed)
Patient presented to appointment with Device RN to receive assistance with remote transmission as patient states his phone transmitter is not working. Transmission were restored with troubleshooting. Specific remote monitor use instructions were provided. Patient also provided information on loop recorder as he states he was not informed of what device exactly records. Paramater for episode recording reviewed with patient. Patient provided device clinic contact at [phone] for additional assistance when needed. States he has been calling main [address] number with no call backs. Device clinic has not been contacted about patient needing assistance with remote monitor. Patient appreciative for in person assistance. ?

## 2022-01-10 LAB — CUP PACEART REMOTE DEVICE CHECK
Date Time Interrogation Session: 20230509000801
Implantable Pulse Generator Implant Date: 20220906

## 2022-01-16 ENCOUNTER — Telehealth: Payer: Self-pay

## 2022-01-16 NOTE — Telephone Encounter (Signed)
LINQ alert received. Presenting rhythm flutter ?33 new AF/flutter, controlled rates ?Burden 48.2%, Warfarin, last ov 3/9 symptomatic with episodes Multaq prescribed, f/u in 72mo?Programmed AF management ?8 pause events, 4-6sec in duration,  nocturnal, majority conversion pauses, 1 pause 08:02, ?sleeping ?Route to triage ?LA ?Reviewed with Dr. CCurt Bears  Jsmith RN ? ?Reviewed with Dr. CCurt Bears Known history of AF. On [place] Per last encounter with patient for remote monitoring assistance, patient stated he is not compliant with Multaq as it made his palpitations worse. Discussed alerts with Dr. CCurt Bearswho advised to change AT/AF setting on Linq from alerting if >10 min to alert if >60 min. Changes made and request sent to device. Will update with tonights remote check.  ? ? ? ? ?

## 2022-01-17 ENCOUNTER — Ambulatory Visit (INDEPENDENT_AMBULATORY_CARE_PROVIDER_SITE_OTHER): Payer: No Typology Code available for payment source

## 2022-01-17 DIAGNOSIS — I4891 Unspecified atrial fibrillation: Secondary | ICD-10-CM | POA: Diagnosis not present

## 2022-01-17 DIAGNOSIS — Z7901 Long term (current) use of anticoagulants: Secondary | ICD-10-CM | POA: Diagnosis not present

## 2022-01-17 LAB — POCT INR: INR: 1.7 — AB (ref 2.0–3.0)

## 2022-01-17 MED ORDER — METOPROLOL SUCCINATE ER 50 MG PO TB24: 50.0000 mg | ORAL_TABLET | Freq: Every day | ORAL | 3 refills | Status: DC

## 2022-01-17 NOTE — Addendum Note (Signed)
Addended by: Darius Bump B on: 01/17/2022 09:42 AM ? ? Modules accepted: Orders ? ?

## 2022-01-17 NOTE — Patient Instructions (Signed)
Description   ?Take an extra 0.5 tablet today and then START taking 1 tablet daily except 1.5 on Sundays.  ?Stay consistent with greens  ?INR check in 3 weeks. [phone] ?  ?   ?

## 2022-01-25 NOTE — Progress Notes (Signed)
Carelink Summary Report / Loop Recorder 

## 2022-01-28 DIAGNOSIS — L299 Pruritus, unspecified: Secondary | ICD-10-CM | POA: Diagnosis not present

## 2022-01-31 DIAGNOSIS — I25119 Atherosclerotic heart disease of native coronary artery with unspecified angina pectoris: Secondary | ICD-10-CM | POA: Diagnosis not present

## 2022-01-31 DIAGNOSIS — I672 Cerebral atherosclerosis: Secondary | ICD-10-CM | POA: Diagnosis not present

## 2022-01-31 DIAGNOSIS — E785 Hyperlipidemia, unspecified: Secondary | ICD-10-CM | POA: Diagnosis not present

## 2022-01-31 DIAGNOSIS — I471 Supraventricular tachycardia: Secondary | ICD-10-CM | POA: Diagnosis not present

## 2022-01-31 DIAGNOSIS — E039 Hypothyroidism, unspecified: Secondary | ICD-10-CM | POA: Diagnosis not present

## 2022-01-31 DIAGNOSIS — K2930 Chronic superficial gastritis without bleeding: Secondary | ICD-10-CM | POA: Diagnosis not present

## 2022-01-31 DIAGNOSIS — I480 Paroxysmal atrial fibrillation: Secondary | ICD-10-CM | POA: Diagnosis not present

## 2022-01-31 DIAGNOSIS — I10 Essential (primary) hypertension: Secondary | ICD-10-CM | POA: Diagnosis not present

## 2022-01-31 DIAGNOSIS — D6869 Other thrombophilia: Secondary | ICD-10-CM | POA: Diagnosis not present

## 2022-02-07 ENCOUNTER — Ambulatory Visit (INDEPENDENT_AMBULATORY_CARE_PROVIDER_SITE_OTHER): Payer: No Typology Code available for payment source

## 2022-02-07 DIAGNOSIS — Z7901 Long term (current) use of anticoagulants: Secondary | ICD-10-CM

## 2022-02-07 DIAGNOSIS — I4891 Unspecified atrial fibrillation: Secondary | ICD-10-CM | POA: Diagnosis not present

## 2022-02-07 LAB — POCT INR: INR: 2.6 (ref 2.0–3.0)

## 2022-02-07 NOTE — Patient Instructions (Signed)
Description   Resume the dose you were taking, 1 tablet daily.  Stay consistent with greens  INR check in 4 weeks. [phone]

## 2022-02-13 ENCOUNTER — Ambulatory Visit (INDEPENDENT_AMBULATORY_CARE_PROVIDER_SITE_OTHER): Payer: No Typology Code available for payment source

## 2022-02-13 DIAGNOSIS — I480 Paroxysmal atrial fibrillation: Secondary | ICD-10-CM | POA: Diagnosis not present

## 2022-02-14 ENCOUNTER — Ambulatory Visit (INDEPENDENT_AMBULATORY_CARE_PROVIDER_SITE_OTHER): Payer: No Typology Code available for payment source | Admitting: Cardiology

## 2022-02-14 ENCOUNTER — Encounter: Payer: Self-pay | Admitting: Cardiology

## 2022-02-14 VITALS — BP 120/70 | HR 50 | Ht 72.0 in | Wt 195.0 lb

## 2022-02-14 DIAGNOSIS — D6869 Other thrombophilia: Secondary | ICD-10-CM

## 2022-02-14 DIAGNOSIS — I480 Paroxysmal atrial fibrillation: Secondary | ICD-10-CM | POA: Diagnosis not present

## 2022-02-14 NOTE — Patient Instructions (Addendum)
Medication Instructions:  Your physician recommends that you continue on your current medications as directed. Please refer to the Current Medication list given to you today.  *If you need a refill on your cardiac medications before your next appointment, please call your pharmacy*   Lab Work: None ordered   Testing/Procedures: None ordered   Follow-Up: At [facility], you and your health needs are our priority.  As part of our continuing mission to provide you with exceptional heart care, we have created designated Provider Care Teams.  These Care Teams include your primary Cardiologist (physician) and Advanced Practice Providers (APPs -  Physician Assistants and Nurse Practitioners) who all work together to provide you with the care you need, when you need it.  Your next appointment:   To be   determined  The format for your next appointment:   In Person  Provider:   Allegra Lai, MD    Thank you for choosing [facility]!!   Trinidad Curet, RN [phone]  Other Instructions    Important Information About Sugar       Tikosyn (Dofetilide) Hospital Admission  Prior to day of admission: Check with drug insurance company for cost of drug to ensure affordability --- Dofetilide 500 mcg twice a day.  GoodRx is an option if insurance copay is unaffordable.  All patients are tested for COVID-19 prior to admission.  No Benadryl is allowed 3 days prior to admission.  Please ensure no missed doses of your anticoagulation (blood thinner) for 3 weeks prior to admission. If a dose is missed please notify our office immediately.  A pharmacist will review all your medications for potential interactions with Tikosyn. If any medication changes are needed prior to admission we will be in touch with you.  If any new medications are started AFTER your admission date is set with Nurse, adult. Please notify our office immediately so your medication list can be updated and  reviewed by our pharmacist again. On day of admission: Tikosyn initiation requires a 3 night/4 day hospital stay with constant telemetry monitoring. You will have an EKG after each dose of Tikosyn as well as daily lab draws.  If the drug does not convert you to normal rhythm a cardioversion after the 4th dose of Tikosyn.  [facility] Clinic office visit on the morning of admission is needed for preliminary labs/ekg.  Time of admission is dependent on bed availability in the hospital. In some instances, you will be sent home until bed is available. Rarely admission can be delayed to the following day if hospital census prevents available beds.  You may bring personal belongings/clothing with you to the hospital. Please leave your suitcase in the car until you arrive in admissions.  Questions please call our office at [phone]      Dofetilide Capsules What is this medication? DOFETILIDE (doe FET il ide) treats a fast or irregular heartbeat (arrhythmia). It works by slowing down overactive electric signals in the heart, which stabilizes your heart rhythm. It belongs to a group of medications called antiarrhythmics. This medicine may be used for other purposes; ask your health care provider or pharmacist if you have questions. COMMON BRAND NAME(S): Tikosyn What should I tell my care team before I take this medication? They need to know if you have any of these conditions: Heart disease History of irregular heartbeat History of low levels of potassium or magnesium in the blood Kidney disease Liver disease An unusual or allergic reaction to dofetilide, other medications, foods,  dyes, or preservatives Pregnant or trying to get pregnant Breast-feeding How should I use this medication? Take this medication by mouth with a glass of water. Follow the directions on the prescription label. Do not take with grapefruit juice. You can take it with or without food. If it upsets your stomach, take it  with food. Take your medication at regular intervals. Do not take it more often than directed. Do not stop taking except on your care team's advice. A special MedGuide will be given to you by the pharmacist with each prescription and refill. Be sure to read this information carefully each time. Talk to your care team about the use of this medication in children. Special care may be needed. Overdosage: If you think you have taken too much of this medicine contact a poison control center or emergency room at once. NOTE: This medicine is only for you. Do not share this medicine with others. What if I miss a dose? If you miss a dose, skip it. Take your next dose at the normal time. Do not take extra or 2 doses at the same time to make up for the missed dose. What may interact with this medication? Do not take this medication with any of the following: Cimetidine Cisapride Dolutegravir Dronedarone Erdafitinib Hydrochlorothiazide Ketoconazole Megestrol Pimozide Prochlorperazine Thioridazine Trimethoprim Verapamil This medication may also interact with the following: Amiloride Cannabinoids Certain antibiotics like erythromycin or clarithromycin Certain antiviral medications for HIV or hepatitis Certain medications for depression, anxiety, or psychotic disorders Digoxin Diltiazem Grapefruit juice Metformin Nefazodone Other medications that prolong the QT interval (an abnormal heart rhythm) Quinine Triamterene Zafirlukast Ziprasidone This list may not describe all possible interactions. Give your health care provider a list of all the medicines, herbs, non-prescription drugs, or dietary supplements you use. Also tell them if you smoke, drink alcohol, or use illegal drugs. Some items may interact with your medicine. What should I watch for while using this medication? Your condition will be monitored carefully while you are receiving this medication. What side effects may I notice from  receiving this medication? Side effects that you should report to your care team as soon as possible: Allergic reactions--skin rash, itching, hives, swelling of the face, lips, tongue, or throat Chest pain Heart rhythm changes--fast or irregular heartbeat, dizziness, feeling faint or lightheaded, chest pain, trouble breathing Side effects that usually do not require medical attention (report to your care team if they continue or are bothersome): Dizziness Headache Nausea Stomach pain Trouble sleeping This list may not describe all possible side effects. Call your doctor for medical advice about side effects. You may report side effects to FDA at 1-800-FDA-1088. Where should I keep my medication? Keep out of the reach of children. Store at room temperature between 15 and 30 degrees C (59 and 86 degrees F). Throw away any unused medication after the expiration date. NOTE: This sheet is a summary. It may not cover all possible information. If you have questions about this medicine, talk to your doctor, pharmacist, or health care provider.  2023 Elsevier/Gold Standard (2021-07-22 00:00:00)

## 2022-02-14 NOTE — Progress Notes (Signed)
Electrophysiology Office Note   Date:  02/14/2022   ID:  Allen [name], DOB 21-Jan-1941, MRN [id number]  PCP:  Street, [name], MD  Cardiologist:  Bettina Gavia Primary Electrophysiologist:  Royann Wildasin Meredith Leeds, MD    Chief Complaint: AF   History of Present Illness: Allen [name] is a 81 y.o. male who is being seen today for the evaluation of AF at the request of Street, [name], *. Presenting today for electrophysiology evaluation.  He has a history significant for atrial fibrillation, CVA, hypertension, SVT post ablation.  He had an abdominal aortic aneurysm and is post EVAR.  He had multiple episodes of atrial fibrillation.  Had ablation several years ago for atrial fibrillation or SVT.  He has palpitations on a daily basis.  He has a Linq monitor that showed episodes of atrial fibrillation and has since been started on Multaq.  Today, denies symptoms of palpitations, chest pain, shortness of breath, orthopnea, PND, lower extremity edema, claudication, dizziness, presyncope, syncope, bleeding, or neurologic sequela. The patient is tolerating medications without difficulties.  He is feeling well today.  He does have a few occasions where he feels a flushed feeling.  This is occurred when he is laying down.  He states that he watches TV in bed and feels this when he is watching TV.  Linq monitor shows episodes of atrial fibrillation with pauses of up to 8 seconds, up to 6 seconds during waking times.   Past Medical History:  Diagnosis Date   AAA (abdominal aortic aneurysm) ([place])    Atrial fibrillation ([place])    Cardiomyopathy, nonischemic ([place]) 10/28/2018   CHF (congestive heart failure) ([place])    CVA (cerebral infarction) 1969   Hypertension    Irregular heart beat    Malaise and fatigue 11/05/2019   Murmur 11/05/2019   Palpitations 11/05/2019   Prostatitis    PSVT (paroxysmal supraventricular tachycardia) ([place]) 05/22/2017   Formatting of this note might be different from the  original. Prior ablation 2006   Thyroid disease    Hypothyrodidism   Varicose veins    Past Surgical History:  Procedure Laterality Date   ABDOMINAL AORTIC ANEURYSM REPAIR     ABLATION     CAROTID ENDARTERECTOMY  November 21, 2006   Left cea   ENDOVENOUS ABLATION SAPHENOUS VEIN W/ LASER Left 11-28-2012   left greater saphenous vein by Curt Jews MD   ENDOVENOUS ABLATION SAPHENOUS VEIN W/ LASER Right 12-26-2012   right gretaer saphenous vein by Curt Jews MD   INGUINAL HERNIA REPAIR     X's  4   INGUINAL HERNIA REPAIR  09-2011   left side   stab phlebectomy Left 03-13-2013   10-20 incisions by Curt Jews MD   TONSILLECTOMY AND ADENOIDECTOMY       Current Outpatient Medications  Medication Sig Dispense Refill   acyclovir (ZOVIRAX) 800 MG tablet Take 400 mg by mouth 2 (two) times daily.     finasteride (PROSCAR) 5 MG tablet Take 1 tablet by mouth daily.     levothyroxine (SYNTHROID) 150 MCG tablet Take 150 mcg by mouth daily before breakfast.     lisinopril (PRINIVIL,ZESTRIL) 10 MG tablet Take 0.5 tablet (5 mg) by mouth daily     metoprolol succinate (TOPROL-XL) 50 MG 24 hr tablet Take 1 tablet (50 mg total) by mouth daily. Take with or immediately following a meal. 90 tablet 3   polyethylene glycol powder (GLYCOLAX/MIRALAX) 17 GM/SCOOP powder as needed for mild constipation.  PRESCRIPTION MEDICATION Take 1 capsule by mouth daily. Generic for Flomax (not Tamsulosin)     psyllium (METAMUCIL) 58.6 % packet TAKE 1 TABLESPOONFUL BY MOUTH DAILY (MIX IN 8 OUNCES OF WATER OR JUICE AND DRINK)     silodosin (RAPAFLO) 8 MG CAPS capsule Take 8 mg by mouth daily with breakfast.     simvastatin (ZOCOR) 20 MG tablet Take 10 mg by mouth at bedtime.     tadalafil (CIALIS) 5 MG tablet Take 5 mg by mouth daily.     Tamsulosin HCl (FLOMAX) 0.4 MG CAPS Take by mouth. Reported on 01/04/2016     warfarin (COUMADIN) 4 MG tablet TAKE 1/2 TO 1 (ONE-HALF TO ONE) TABLET BY MOUTH ONCE DAILY AS DIRECTED BY   COUMADIN  CLINIC 90 tablet 0   No current facility-administered medications for this visit.    Allergies:   Digoxin and Other   Social History:  The patient  reports that he quit smoking about 28 years ago. His smoking use included cigarettes. He has been exposed to tobacco smoke. He has never used smokeless tobacco. He reports current alcohol use of about 2.0 - 4.0 standard drinks of alcohol per week. He reports that he does not use drugs.   Family History:  The patient's family history includes Angina in his mother; Heart disease in his sister; Hypertension in his sister; Stroke in his father.   ROS:  Please see the history of present illness.   Otherwise, review of systems is positive for none.   All other systems are reviewed and negative.   PHYSICAL EXAM: VS:  BP 120/70 (BP Location: Left Arm, Patient Position: Sitting, Cuff Size: Normal)   Pulse (!) 50   Ht 6' (1.829 m)   Wt 195 lb (88.5 kg)   SpO2 95%   BMI 26.45 kg/m  , BMI Body mass index is 26.45 kg/m. GEN: Well nourished, well developed, in no acute distress  HEENT: normal  Neck: no JVD, carotid bruits, or masses Cardiac: RRR; no murmurs, rubs, or gallops,no edema  Respiratory:  clear to auscultation bilaterally, normal work of breathing GI: soft, nontender, nondistended, + BS MS: no deformity or atrophy  Skin: warm and dry, device site well healed Neuro:  Strength and sensation are intact Psych: euthymic mood, full affect  EKG:  EKG is not ordered today. Personal review of the ekg ordered 11/10/21 shows sinus rhythm  Personal review of the device interrogation today. Results in [facility]: 06/27/2021: NT-Pro BNP 373 12/26/2021: ALT 12; BUN 12; Creatinine, Ser 1.01; Hemoglobin 13.4; Platelets 180; Potassium 4.8; Sodium 136; TSH 0.547    Lipid Panel     Component Value Date/Time   CHOL 107 12/26/2021 0859   TRIG 64 12/26/2021 0859   HDL 43 12/26/2021 0859   CHOLHDL 2.5 12/26/2021 0859   LDLCALC 50  12/26/2021 0859     Wt Readings from Last 3 Encounters:  02/14/22 195 lb (88.5 kg)  01/03/22 196 lb (88.9 kg)  12/26/21 196 lb 9.6 oz (89.2 kg)      Other studies Reviewed: Additional studies/ records that were reviewed today include: Cardiac monitor 03/14/20 personally reviewed  Review of the above records today demonstrates:  Conclusion, rare ventricular ectopy occasional supraventricular ectopy and triggered and diary events associated with APCs and PVCs.    ASSESSMENT AND PLAN:  1.  Paroxysmal atrial fibrillation: Currently on warfarin.  CHA2DS2-VASc of 6.  Status post Linq monitor implant.  He is having more frequent  episodes of atrial fibrillation.  He is also having pauses on his Linq monitor.  He has not had syncope.  I told him that it would be beneficial to stay in normal rhythm.  He would like to avoid ablation if possible.  We discussed options including dofetilide load.  He Aivah Putman think about it and let us know.  2.  Hypertension: Currently well controlled  3.  Hyperlipidemia: Continue statin per primary cardiology  4.  Secondary hypercoagulable state: Currently on warfarin for atrial fibrillation as above.  Current medicines are reviewed at length with the patient today.   The patient does not have concerns regarding his medicines.  The following changes were made today: None  Labs/ tests ordered today include:  No orders of the defined types were placed in this encounter.     Disposition:   FU 6 months  Signed, Isaac Lacson Meredith Leeds, MD  02/14/2022 2:11 PM     [place]  [address] [phone] (office) [phone] (fax)

## 2022-02-15 ENCOUNTER — Encounter: Payer: Self-pay | Admitting: Cardiology

## 2022-02-15 LAB — CUP PACEART REMOTE DEVICE CHECK
Date Time Interrogation Session: 20230613123153
Implantable Pulse Generator Implant Date: 20220906

## 2022-02-19 ENCOUNTER — Other Ambulatory Visit: Payer: Self-pay | Admitting: Cardiology

## 2022-02-21 DIAGNOSIS — D6869 Other thrombophilia: Secondary | ICD-10-CM | POA: Diagnosis not present

## 2022-02-21 DIAGNOSIS — I471 Supraventricular tachycardia: Secondary | ICD-10-CM | POA: Diagnosis not present

## 2022-02-21 DIAGNOSIS — I480 Paroxysmal atrial fibrillation: Secondary | ICD-10-CM | POA: Diagnosis not present

## 2022-02-21 DIAGNOSIS — K2930 Chronic superficial gastritis without bleeding: Secondary | ICD-10-CM | POA: Diagnosis not present

## 2022-02-23 ENCOUNTER — Encounter: Payer: Self-pay | Admitting: Cardiology

## 2022-02-23 ENCOUNTER — Telehealth: Payer: Self-pay | Admitting: Pharmacist

## 2022-02-23 NOTE — Telephone Encounter (Signed)
Medication list reviewed in anticipation of upcoming Tikosyn initiation. Patient is not taking any contraindicated or QTc prolonging medications.   Patient is anticoagulated on warfarin. He will require 4 weekly therapeutic INRs before he can start Tikosyn. His INRs are followed in the [facility] office.  Patient will need to be counseled to avoid use of Benadryl while on Tikosyn and in the 2-3 days prior to Tikosyn initiation.

## 2022-02-23 NOTE — Telephone Encounter (Signed)
Called pt and asked if he would like to start his 4 consecutive INR checks this coming Tuesday 02/28/22; however pt stated he can not come the following week due to vacation ( 7/3-7/7). Pt has schedule INR check on 03/14/22 and will start his 4 consecutive INR checks at this appt. Pt verbalized understanding.

## 2022-02-24 ENCOUNTER — Encounter (HOSPITAL_COMMUNITY): Payer: Self-pay

## 2022-02-28 ENCOUNTER — Encounter: Payer: Self-pay | Admitting: Cardiology

## 2022-03-06 NOTE — Progress Notes (Signed)
Carelink Summary Report / Loop Recorder 

## 2022-03-08 DIAGNOSIS — Z79899 Other long term (current) drug therapy: Secondary | ICD-10-CM | POA: Diagnosis not present

## 2022-03-08 DIAGNOSIS — I25119 Atherosclerotic heart disease of native coronary artery with unspecified angina pectoris: Secondary | ICD-10-CM | POA: Diagnosis not present

## 2022-03-08 DIAGNOSIS — I10 Essential (primary) hypertension: Secondary | ICD-10-CM | POA: Diagnosis not present

## 2022-03-08 DIAGNOSIS — E785 Hyperlipidemia, unspecified: Secondary | ICD-10-CM | POA: Diagnosis not present

## 2022-03-08 DIAGNOSIS — E039 Hypothyroidism, unspecified: Secondary | ICD-10-CM | POA: Diagnosis not present

## 2022-03-08 DIAGNOSIS — I480 Paroxysmal atrial fibrillation: Secondary | ICD-10-CM | POA: Diagnosis not present

## 2022-03-08 DIAGNOSIS — I471 Supraventricular tachycardia: Secondary | ICD-10-CM | POA: Diagnosis not present

## 2022-03-08 DIAGNOSIS — R7301 Impaired fasting glucose: Secondary | ICD-10-CM | POA: Diagnosis not present

## 2022-03-08 DIAGNOSIS — D6869 Other thrombophilia: Secondary | ICD-10-CM | POA: Diagnosis not present

## 2022-03-08 DIAGNOSIS — I672 Cerebral atherosclerosis: Secondary | ICD-10-CM | POA: Diagnosis not present

## 2022-03-08 DIAGNOSIS — Z0000 Encounter for general adult medical examination without abnormal findings: Secondary | ICD-10-CM | POA: Diagnosis not present

## 2022-03-12 LAB — CUP PACEART REMOTE DEVICE CHECK
Date Time Interrogation Session: 20230708230744
Implantable Pulse Generator Implant Date: 20220906

## 2022-03-13 ENCOUNTER — Encounter: Payer: Self-pay | Admitting: Cardiology

## 2022-03-14 ENCOUNTER — Ambulatory Visit (INDEPENDENT_AMBULATORY_CARE_PROVIDER_SITE_OTHER): Payer: No Typology Code available for payment source

## 2022-03-14 DIAGNOSIS — Z7901 Long term (current) use of anticoagulants: Secondary | ICD-10-CM

## 2022-03-14 DIAGNOSIS — I4891 Unspecified atrial fibrillation: Secondary | ICD-10-CM

## 2022-03-14 LAB — POCT INR: INR: 1.8 — AB (ref 2.0–3.0)

## 2022-03-14 NOTE — Patient Instructions (Signed)
Description   Take 2 tablets today and then continue  taking 1 tablet daily.  Stay consistent with greens  INR check in 1 week - WEEKLY CHECKS. [phone]

## 2022-03-20 ENCOUNTER — Ambulatory Visit (INDEPENDENT_AMBULATORY_CARE_PROVIDER_SITE_OTHER): Payer: No Typology Code available for payment source

## 2022-03-20 DIAGNOSIS — I4891 Unspecified atrial fibrillation: Secondary | ICD-10-CM

## 2022-03-21 ENCOUNTER — Ambulatory Visit (INDEPENDENT_AMBULATORY_CARE_PROVIDER_SITE_OTHER): Payer: No Typology Code available for payment source

## 2022-03-21 DIAGNOSIS — I4891 Unspecified atrial fibrillation: Secondary | ICD-10-CM

## 2022-03-21 DIAGNOSIS — Z7901 Long term (current) use of anticoagulants: Secondary | ICD-10-CM

## 2022-03-21 LAB — POCT INR: INR: 2.2 (ref 2.0–3.0)

## 2022-03-21 NOTE — Patient Instructions (Signed)
Description   START taking 1 tablet daily except 1.5 tablets on Friday.  Stay consistent with greens  INR check in 1 week - WEEKLY CHECKS. [phone]

## 2022-03-28 ENCOUNTER — Ambulatory Visit (INDEPENDENT_AMBULATORY_CARE_PROVIDER_SITE_OTHER): Payer: No Typology Code available for payment source

## 2022-03-28 DIAGNOSIS — Z7901 Long term (current) use of anticoagulants: Secondary | ICD-10-CM

## 2022-03-28 DIAGNOSIS — I4891 Unspecified atrial fibrillation: Secondary | ICD-10-CM | POA: Diagnosis not present

## 2022-03-28 LAB — POCT INR: INR: 2.2 (ref 2.0–3.0)

## 2022-03-28 NOTE — Patient Instructions (Signed)
Description   Continue taking 1 tablet daily except 1.5 tablets on Friday.  Stay consistent with greens  INR check in 1 week - WEEKLY CHECKS. [phone]

## 2022-04-04 ENCOUNTER — Ambulatory Visit (HOSPITAL_COMMUNITY): Payer: Medicare Other | Admitting: Nurse Practitioner

## 2022-04-04 ENCOUNTER — Ambulatory Visit (INDEPENDENT_AMBULATORY_CARE_PROVIDER_SITE_OTHER): Payer: No Typology Code available for payment source

## 2022-04-04 DIAGNOSIS — I4891 Unspecified atrial fibrillation: Secondary | ICD-10-CM

## 2022-04-04 DIAGNOSIS — Z7901 Long term (current) use of anticoagulants: Secondary | ICD-10-CM | POA: Diagnosis not present

## 2022-04-04 LAB — POCT INR: INR: 2.1 (ref 2.0–3.0)

## 2022-04-04 NOTE — Patient Instructions (Addendum)
Description   Take 1.5 tablets today and then take 1 tablet daily except 1.5 tablets on Friday and Sunday.  Stay consistent with greens  INR check in 1 week - WEEKLY CHECKS. [phone]

## 2022-04-11 ENCOUNTER — Ambulatory Visit (INDEPENDENT_AMBULATORY_CARE_PROVIDER_SITE_OTHER): Payer: No Typology Code available for payment source

## 2022-04-11 DIAGNOSIS — I4891 Unspecified atrial fibrillation: Secondary | ICD-10-CM

## 2022-04-11 DIAGNOSIS — Z7901 Long term (current) use of anticoagulants: Secondary | ICD-10-CM

## 2022-04-11 LAB — POCT INR: INR: 2.3 (ref 2.0–3.0)

## 2022-04-11 NOTE — Patient Instructions (Signed)
Description   Take an extra 0.5 tablet today and then take 1 tablet daily except 1.5 tablets on Friday and Sunday.  Stay consistent with greens  INR check in 1 week - WEEKLY CHECKS. [phone]

## 2022-04-13 DIAGNOSIS — D2239 Melanocytic nevi of other parts of face: Secondary | ICD-10-CM | POA: Diagnosis not present

## 2022-04-13 DIAGNOSIS — L814 Other melanin hyperpigmentation: Secondary | ICD-10-CM | POA: Diagnosis not present

## 2022-04-13 DIAGNOSIS — D225 Melanocytic nevi of trunk: Secondary | ICD-10-CM | POA: Diagnosis not present

## 2022-04-13 DIAGNOSIS — L570 Actinic keratosis: Secondary | ICD-10-CM | POA: Diagnosis not present

## 2022-04-14 ENCOUNTER — Encounter (HOSPITAL_COMMUNITY): Payer: Self-pay

## 2022-04-17 ENCOUNTER — Ambulatory Visit: Payer: No Typology Code available for payment source

## 2022-04-18 ENCOUNTER — Ambulatory Visit (INDEPENDENT_AMBULATORY_CARE_PROVIDER_SITE_OTHER): Payer: No Typology Code available for payment source

## 2022-04-18 ENCOUNTER — Other Ambulatory Visit: Payer: Self-pay

## 2022-04-18 ENCOUNTER — Encounter (HOSPITAL_COMMUNITY): Payer: Self-pay | Admitting: Cardiology

## 2022-04-18 ENCOUNTER — Ambulatory Visit (HOSPITAL_COMMUNITY)
Admission: RE | Admit: 2022-04-18 | Discharge: 2022-04-18 | Disposition: A | Discharge status: Home / Self Care | Payer: No Typology Code available for payment source | Source: Ambulatory Visit | Attending: Nurse Practitioner | Admitting: Nurse Practitioner

## 2022-04-18 ENCOUNTER — Inpatient Hospital Stay (HOSPITAL_COMMUNITY)
Admission: AD | Admit: 2022-04-18 | Discharge: 2022-04-21 | DRG: 310 | Disposition: A | Discharge status: Home / Self Care | Payer: No Typology Code available for payment source | Source: Ambulatory Visit | Attending: Cardiology | Admitting: Cardiology

## 2022-04-18 ENCOUNTER — Encounter (HOSPITAL_COMMUNITY): Payer: Self-pay | Admitting: Nurse Practitioner

## 2022-04-18 VITALS — BP 156/70 | HR 45 | Ht 72.0 in | Wt 198.0 lb

## 2022-04-18 DIAGNOSIS — Z8673 Personal history of transient ischemic attack (TIA), and cerebral infarction without residual deficits: Secondary | ICD-10-CM

## 2022-04-18 DIAGNOSIS — I428 Other cardiomyopathies: Secondary | ICD-10-CM | POA: Diagnosis not present

## 2022-04-18 DIAGNOSIS — I4892 Unspecified atrial flutter: Secondary | ICD-10-CM | POA: Diagnosis not present

## 2022-04-18 DIAGNOSIS — Z87891 Personal history of nicotine dependence: Secondary | ICD-10-CM | POA: Diagnosis not present

## 2022-04-18 DIAGNOSIS — Z79899 Other long term (current) drug therapy: Secondary | ICD-10-CM

## 2022-04-18 DIAGNOSIS — D6869 Other thrombophilia: Secondary | ICD-10-CM

## 2022-04-18 DIAGNOSIS — Z7901 Long term (current) use of anticoagulants: Secondary | ICD-10-CM

## 2022-04-18 DIAGNOSIS — Z79890 Hormone replacement therapy (postmenopausal): Secondary | ICD-10-CM | POA: Diagnosis not present

## 2022-04-18 DIAGNOSIS — Z888 Allergy status to other drugs, medicaments and biological substances status: Secondary | ICD-10-CM

## 2022-04-18 DIAGNOSIS — I4891 Unspecified atrial fibrillation: Secondary | ICD-10-CM | POA: Diagnosis not present

## 2022-04-18 DIAGNOSIS — I480 Paroxysmal atrial fibrillation: Principal | ICD-10-CM | POA: Diagnosis present

## 2022-04-18 DIAGNOSIS — I10 Essential (primary) hypertension: Secondary | ICD-10-CM | POA: Diagnosis not present

## 2022-04-18 DIAGNOSIS — E039 Hypothyroidism, unspecified: Secondary | ICD-10-CM | POA: Diagnosis not present

## 2022-04-18 DIAGNOSIS — I471 Supraventricular tachycardia: Secondary | ICD-10-CM

## 2022-04-18 HISTORY — DX: Paroxysmal atrial fibrillation: I48.0

## 2022-04-18 LAB — MAGNESIUM: Magnesium: 2.1 mg/dL (ref 1.7–2.4)

## 2022-04-18 LAB — BASIC METABOLIC PANEL
Anion gap: 6 (ref 5–15)
BUN: 12 mg/dL (ref 8–23)
CO2: 25 mmol/L (ref 22–32)
Calcium: 8.8 mg/dL — ABNORMAL LOW (ref 8.9–10.3)
Chloride: 105 mmol/L (ref 98–111)
Creatinine, Ser: 1 mg/dL (ref 0.61–1.24)
GFR, Estimated: >60 mL/min (ref >60)
Glucose, Bld: 98 mg/dL (ref 70–99)
Potassium: 4.7 mmol/L (ref 3.5–5.1)
Sodium: 136 mmol/L (ref 135–145)

## 2022-04-18 LAB — POCT INR: INR: 2.9 (ref 2.0–3.0)

## 2022-04-18 MED ORDER — FINASTERIDE 5 MG PO TABS
5.0000 mg | ORAL_TABLET | Freq: Every day | ORAL | Status: DC
  Administered 2022-04-19 – 2022-04-21 (×3): 5 mg via ORAL
  Filled 2022-04-18 (×3): qty 1

## 2022-04-18 MED ORDER — SODIUM CHLORIDE 0.9% FLUSH
3.0000 mL | Freq: Two times a day (BID) | INTRAVENOUS | Status: DC
  Administered 2022-04-18 – 2022-04-21 (×7): 3 mL via INTRAVENOUS

## 2022-04-18 MED ORDER — SIMVASTATIN 20 MG PO TABS
10.0000 mg | ORAL_TABLET | Freq: Every day | ORAL | Status: DC
  Administered 2022-04-18 – 2022-04-20 (×3): 10 mg via ORAL
  Filled 2022-04-18 (×3): qty 1

## 2022-04-18 MED ORDER — ACYCLOVIR 400 MG PO TABS
400.0000 mg | ORAL_TABLET | Freq: Two times a day (BID) | ORAL | Status: DC
  Administered 2022-04-18 – 2022-04-21 (×6): 400 mg via ORAL
  Filled 2022-04-18 (×8): qty 1

## 2022-04-18 MED ORDER — DOFETILIDE 500 MCG PO CAPS
500.0000 ug | ORAL_CAPSULE | Freq: Two times a day (BID) | ORAL | Status: DC
Start: 2022-04-18 — End: 2022-04-21
  Administered 2022-04-18 – 2022-04-21 (×6): 500 ug via ORAL
  Filled 2022-04-18 (×6): qty 1

## 2022-04-18 MED ORDER — LEVOTHYROXINE SODIUM 75 MCG PO TABS
150.0000 ug | ORAL_TABLET | Freq: Every day | ORAL | Status: DC
  Administered 2022-04-19 – 2022-04-21 (×3): 150 ug via ORAL
  Filled 2022-04-18 (×3): qty 2

## 2022-04-18 MED ORDER — WARFARIN SODIUM 2 MG PO TABS
4.0000 mg | ORAL_TABLET | ORAL | Status: DC
  Administered 2022-04-19 – 2022-04-20 (×2): 4 mg via ORAL
  Filled 2022-04-18 (×2): qty 2

## 2022-04-18 MED ORDER — TAMSULOSIN HCL 0.4 MG PO CAPS
0.4000 mg | ORAL_CAPSULE | Freq: Every morning | ORAL | Status: DC
Start: 2022-04-19 — End: 2022-04-19
  Administered 2022-04-18: 0.4 mg via ORAL
  Filled 2022-04-18 (×2): qty 1

## 2022-04-18 MED ORDER — WARFARIN SODIUM 5 MG PO TABS: 6.0000 mg | ORAL_TABLET | ORAL | Status: DC

## 2022-04-18 MED ORDER — SODIUM CHLORIDE 0.9 % IV SOLN: 250.0000 mL | INTRAVENOUS | Status: DC | PRN

## 2022-04-18 MED ORDER — SODIUM CHLORIDE 0.9% FLUSH: 3.0000 mL | INTRAVENOUS | Status: DC | PRN

## 2022-04-18 MED ORDER — WARFARIN - PHARMACIST DOSING INPATIENT: Freq: Every day | Status: DC

## 2022-04-18 NOTE — Care Management (Signed)
  Transition of Care [facility]) Screening Note   Patient Details  Name: [name] Date of Birth: Feb 19, 1941   Transition of [facility]) CM/SW Contact:    Bethena Roys, RN Phone Number: 04/18/2022, 4:03 PM    Transition of Care Department [facility]) has reviewed the patient. Patient presented for Tikosyn Load. Benefits check submitted for cost. Case Manager will discuss cost and pharmacy of choice as the patient progresses.

## 2022-04-18 NOTE — Progress Notes (Signed)
ANTICOAGULATION CONSULT NOTE - Initial Consult  Pharmacy Consult for warfarin Indication: atrial fibrillation  Allergies  Allergen Reactions   Digoxin Other (See Comments)    Rapid heart beat, chest pressure Rapid heart beat, chest pressure    Other Other (See Comments)    Rapid heart rate,chest pressure Rapid heart rate,chest pressure     Vital Signs: BP: 156/70 (08/15 1034) Pulse Rate: 45 (08/15 1034)  Labs: Recent Labs    04/18/22 0851 04/18/22 1229  INR 2.9  --   CREATININE  --  1.00    Estimated Creatinine Clearance: 64.7 mL/min (by C-G formula based on SCr of 1 mg/dL).   Medical History: Past Medical History:  Diagnosis Date   AAA (abdominal aortic aneurysm) ([place])    Atrial fibrillation ([facility])    Cardiomyopathy, nonischemic ([place]) 10/28/2018   CHF (congestive heart failure) ([place])    CVA (cerebral infarction) 1969   Hypertension    Irregular heart beat    Malaise and fatigue 11/05/2019   Murmur 11/05/2019   Palpitations 11/05/2019   Prostatitis    PSVT (paroxysmal supraventricular tachycardia) ([place]) 05/22/2017   Formatting of this note might be different from the original. Prior ablation 2006   Thyroid disease    Hypothyrodidism   Varicose veins       Assessment: 73 you male here for Tikosyn initiation on warfarin PTA for afib. Pharmacy consulted to dose warfarin -INR was 2.9 in clinic today -he takes his warfarin in the morning and has already taken his dose today  Home dose: '4mg'$ /day except take '6mg'$  on F  Goal of Therapy:  INR 2-3 Monitor platelets by anticoagulation protocol: Yes   Plan:  -resume warfarin at the home dose (start 8/16) -Daily PT/INR   Hildred Laser, PharmD Clinical Pharmacist **Pharmacist phone directory can now be found on [email or web address] ([email or web address]).  Listed under [place].

## 2022-04-18 NOTE — Progress Notes (Signed)
Pharmacy: Dofetilide (Tikosyn) - Initial Consult Assessment and Electrolyte Replacement  Pharmacy consulted to assist in monitoring and replacing electrolytes in this 81 y.o. male admitted on 04/18/2022 undergoing dofetilide initiation.   Assessment:  Patient Exclusion Criteria: If any screening criteria checked as "Yes", then  patient  should NOT receive dofetilide until criteria item is corrected.  If "Yes" please indicate correction plan.  YES  NO Patient  Exclusion Criteria Correction Plan   '[]'$   '[x]'$   Baseline QTc interval is greater than or equal to 440 msec. IF above YES box checked dofetilide contraindicated unless patient has ICD; then may proceed if QTc 500-550 msec or with known ventricular conduction abnormalities may proceed with QTc 550-600 msec. QTc =  392    '[]'$   '[x]'$   Patient is known or suspected to have a digoxin level greater than 2 ng/ml: No results found for: "DIGOXIN"     '[]'$   '[x]'$   Creatinine clearance less than 20 ml/min (calculated using Cockcroft-Gault, actual body weight and serum creatinine): Estimated Creatinine Clearance: 64.7 mL/min (by C-G formula based on SCr of 1 mg/dL).     '[]'$   '[x]'$  Patient has received drugs known to prolong the QT intervals within the last 48 hours (phenothiazines, tricyclics or tetracyclic antidepressants, erythromycin, H-1 antihistamines, cisapride, fluoroquinolones, azithromycin, ondansetron).   Updated information on QT prolonging agents is available to be searched on the following database:QT prolonging agents     '[]'$   '[x]'$   Patient received a dose of hydrochlorothiazide (Oretic) alone or in any combination including triamterene (Dyazide, Maxzide) in the last 48 hours.    '[]'$   '[x]'$  Patient received a medication known to increase dofetilide plasma concentrations prior to initial dofetilide dose:  Trimethoprim (Primsol, Proloprim) in the last 36 hours Verapamil (Calan, Verelan) in the last 36 hours or a sustained release dose in  the last 72 hours Megestrol (Megace) in the last 5 days  Cimetidine (Tagamet) in the last 6 hours Ketoconazole (Nizoral) in the last 24 hours Itraconazole (Sporanox) in the last 48 hours  Prochlorperazine (Compazine) in the last 36 hours     '[]'$   '[x]'$   Patient is known to have a history of torsades de pointes; congenital or acquired long QT syndromes.    '[]'$   '[x]'$   Patient has received a Class 1 antiarrhythmic with less than 2 half-lives since last dose. (Disopyramide, Quinidine, Procainamide, Lidocaine, Mexiletine, Flecainide, Propafenone)    '[]'$   '[x]'$   Patient has received amiodarone therapy in the past 3 months or amiodarone level is greater than 0.3 ng/ml.    Patient has been appropriately anticoagulated with warfarin (INR= 2.9).  Labs:    Component Value Date/Time   K 4.7 04/18/2022 1229   MG 2.1 04/18/2022 1229     Plan: Potassium: K >/= 4: Appropriate to initiate Tikosyn, no replacement needed    Magnesium: Mg >2: Appropriate to initiate Tikosyn, no replacement needed     Thank you for allowing pharmacy to participate in this patient's care   Hildred Laser, PharmD Clinical Pharmacist **Pharmacist phone directory can now be found on [email or web address] ([email or web address]).  Listed under [place].

## 2022-04-18 NOTE — Progress Notes (Signed)
Primary Care Physician: Street, [name], MD Referring Physician:Dr. Thor Nannini is a 81 y.o. male with a h/o  significant for atrial fibrillation, CVA, hypertension, SVT post ablation.  He had an abdominal aortic aneurysm and is post EVAR.  He had multiple episodes of atrial fibrillation.  Had ablation several years ago for atrial fibrillation or SVT.  He has palpitations on a daily basis.  He has a Linq monitor that showed episodes of atrial fibrillation and was on Multaq very shortly months ago. He did not feel well on Multaq.  He was seen by Dr. Curt Bears 6/13 and Tikosyn admission was discussed. He is now here now  for Tikosyn admit. He is in Sinus brady at 45 bpm with a qtc interval of 392 ms. He is on warfarin and has had 5 weeks of therapeutic INR's as of this am. No benadryl use.   Today, he denies symptoms of palpitations, chest pain, shortness of breath, orthopnea, PND, lower extremity edema, dizziness, presyncope, syncope, or neurologic sequela. The patient is tolerating medications without difficulties and is otherwise without complaint today.   Past Medical History:  Diagnosis Date   AAA (abdominal aortic aneurysm) ([place])    Atrial fibrillation ([place])    Cardiomyopathy, nonischemic ([place]) 10/28/2018   CHF (congestive heart failure) ([place])    CVA (cerebral infarction) 1969   Hypertension    Irregular heart beat    Malaise and fatigue 11/05/2019   Murmur 11/05/2019   Palpitations 11/05/2019   Prostatitis    PSVT (paroxysmal supraventricular tachycardia) ([place]) 05/22/2017   Formatting of this note might be different from the original. Prior ablation 2006   Thyroid disease    Hypothyrodidism   Varicose veins    Past Surgical History:  Procedure Laterality Date   ABDOMINAL AORTIC ANEURYSM REPAIR     ABLATION     CAROTID ENDARTERECTOMY  November 21, 2006   Left cea   ENDOVENOUS ABLATION SAPHENOUS VEIN W/ LASER Left 11-28-2012   left greater saphenous vein by Curt Jews  MD   ENDOVENOUS ABLATION SAPHENOUS VEIN W/ LASER Right 12-26-2012   right gretaer saphenous vein by Curt Jews MD   INGUINAL HERNIA REPAIR     X's  4   INGUINAL HERNIA REPAIR  09-2011   left side   stab phlebectomy Left 03-13-2013   10-20 incisions by Curt Jews MD   TONSILLECTOMY AND ADENOIDECTOMY      Current Outpatient Medications  Medication Sig Dispense Refill   acyclovir (ZOVIRAX) 800 MG tablet Take 400 mg by mouth 2 (two) times daily.     finasteride (PROSCAR) 5 MG tablet Take 1 tablet by mouth daily.     levothyroxine (SYNTHROID) 150 MCG tablet Take 150 mcg by mouth daily before breakfast.     lisinopril (PRINIVIL,ZESTRIL) 10 MG tablet Take 0.5 tablet (5 mg) by mouth daily     metoprolol succinate (TOPROL-XL) 50 MG 24 hr tablet Take 1 tablet (50 mg total) by mouth daily. Take with or immediately following a meal. 90 tablet 3   polyethylene glycol powder (GLYCOLAX/MIRALAX) 17 GM/SCOOP powder as needed for mild constipation.     psyllium (METAMUCIL) 58.6 % packet TAKE 1 TABLESPOONFUL BY MOUTH DAILY (MIX IN 8 OUNCES OF WATER OR JUICE AND DRINK)     silodosin (RAPAFLO) 8 MG CAPS capsule Take 8 mg by mouth at bedtime.     simvastatin (ZOCOR) 20 MG tablet Take 10 mg by mouth at bedtime.  Sodium Hyaluronate, oral, (HYALURONIC ACID) 100 MG CAPS Take 100 mg by mouth at bedtime.     tadalafil (CIALIS) 5 MG tablet Take 5 mg by mouth daily.     Tamsulosin HCl (FLOMAX) 0.4 MG CAPS Take 0.4 mg by mouth at bedtime. Reported on 01/04/2016     warfarin (COUMADIN) 4 MG tablet TAKE 1/2 TO 1 (ONE-HALF TO ONE) TABLET BY MOUTH ONCE DAILY AS DIRECTED BY  COUMADIN  CLINIC 90 tablet 0   No current facility-administered medications for this encounter.    Allergies  Allergen Reactions   Digoxin Other (See Comments)    Rapid heart beat, chest pressure Rapid heart beat, chest pressure    Other Other (See Comments)    Rapid heart rate,chest pressure Rapid heart rate,chest pressure     Social  History   Socioeconomic History   Marital status: Divorced    Spouse name: Not on file   Number of children: Not on file   Years of education: Not on file   Highest education level: Not on file  Occupational History   Not on file  Tobacco Use   Smoking status: Former    Types: Cigarettes    Quit date: 09/04/1993    Years since quitting: 28.6    Passive exposure: Past   Smokeless tobacco: Never  Vaping Use   Vaping Use: Never used  Substance and Sexual Activity   Alcohol use: Yes    Alcohol/week: 2.0 - 4.0 standard drinks of alcohol    Types: 2 - 4 Glasses of wine per week   Drug use: No   Sexual activity: Not on file  Other Topics Concern   Not on file  Social History Narrative   Not on file   Social Determinants of Health   Financial Resource Strain: Not on file  Food Insecurity: Not on file  Transportation Needs: Not on file  Physical Activity: Not on file  Stress: Not on file  Social Connections: Not on file  Intimate Partner Violence: Not on file    Family History  Problem Relation Age of Onset   Stroke Father    Angina Mother    Hypertension Sister    Heart disease Sister     ROS- All systems are reviewed and negative except as per the HPI above  Physical Exam: Vitals:   04/18/22 1034  Pulse: (!) 45  Weight: 89.8 kg  Height: 6' (1.829 m)   Wt Readings from Last 3 Encounters:  04/18/22 89.8 kg  02/14/22 88.5 kg  01/03/22 88.9 kg    Labs: Lab Results  Component Value Date   NA 136 12/26/2021   K 4.8 12/26/2021   CL 101 12/26/2021   CO2 23 12/26/2021   GLUCOSE 92 12/26/2021   BUN 12 12/26/2021   CREATININE 1.01 12/26/2021   CALCIUM 8.8 12/26/2021   Lab Results  Component Value Date   INR 2.9 04/18/2022   Lab Results  Component Value Date   CHOL 107 12/26/2021   HDL 43 12/26/2021   LDLCALC 50 12/26/2021   TRIG 64 12/26/2021     GEN- The patient is well appearing, alert and oriented x 3 today.   Head- normocephalic,  atraumatic Eyes-  Sclera clear, conjunctiva pink Ears- hearing intact Oropharynx- clear Neck- supple, no JVP Lymph- no cervical lymphadenopathy Lungs- Clear to ausculation bilaterally, normal work of breathing Heart- Regular rate and rhythm, no murmurs, rubs or gallops, PMI not laterally displaced GI- soft, NT, ND, + BS Extremities-  no clubbing, cyanosis, or edema MS- no significant deformity or atrophy Skin- no rash or lesion Psych- euthymic mood, full affect Neuro- strength and sensation are intact  EKG-Vent. rate 45 BPM PR interval 206 ms QRS duration 88 ms QT/QTcB 454/392 ms P-R-T axes 71 36 62 Sinus bradycardia Otherwise normal ECG When compared with ECG of 19-Nov-2006 15:01, PREVIOUS ECG IS PRESENT    Assessment and Plan:  1. Afib  Increase in afib burden In sinus brady today  Here for tikosyn admit No benadryl use General education re tikosyn use and precautions Bmet/mag with  acceptable K+/mag today    2. CHA2DS2VASc  score of at least 4  Has had therapeutic 5 weekly  INR levels with INR of 2.9 today   3. HTN  Elevated today Pt states usually low in afib at home   To 6 E 16   Yeng Frankie C. Mayrin Schmuck, [facility] [address] [place], [place] [address] [phone]

## 2022-04-18 NOTE — Patient Instructions (Signed)
Description   Continue taking 1 tablet daily except 1.5 tablets on Fridays. Stay consistent with greens  INR check in 2 weeks - [phone]

## 2022-04-19 ENCOUNTER — Other Ambulatory Visit (HOSPITAL_COMMUNITY): Payer: Self-pay

## 2022-04-19 ENCOUNTER — Telehealth (HOSPITAL_COMMUNITY): Payer: Self-pay | Admitting: Pharmacy Technician

## 2022-04-19 DIAGNOSIS — I480 Paroxysmal atrial fibrillation: Secondary | ICD-10-CM | POA: Diagnosis not present

## 2022-04-19 LAB — CUP PACEART REMOTE DEVICE CHECK
Date Time Interrogation Session: 20230815145752
Implantable Pulse Generator Implant Date: 20220906

## 2022-04-19 LAB — PROTIME-INR
INR: 2.7 — ABNORMAL HIGH (ref 0.8–1.2)
Prothrombin Time: 28.8 seconds — ABNORMAL HIGH (ref 11.4–15.2)

## 2022-04-19 LAB — BASIC METABOLIC PANEL
Anion gap: 5 (ref 5–15)
BUN: 11 mg/dL (ref 8–23)
CO2: 24 mmol/L (ref 22–32)
Calcium: 8.4 mg/dL — ABNORMAL LOW (ref 8.9–10.3)
Chloride: 105 mmol/L (ref 98–111)
Creatinine, Ser: 1.03 mg/dL (ref 0.61–1.24)
GFR, Estimated: >60 mL/min (ref >60)
Glucose, Bld: 102 mg/dL — ABNORMAL HIGH (ref 70–99)
Potassium: 4.1 mmol/L (ref 3.5–5.1)
Sodium: 134 mmol/L — ABNORMAL LOW (ref 135–145)

## 2022-04-19 LAB — MAGNESIUM: Magnesium: 2 mg/dL (ref 1.7–2.4)

## 2022-04-19 MED ORDER — TAMSULOSIN HCL 0.4 MG PO CAPS
0.4000 mg | ORAL_CAPSULE | Freq: Every day | ORAL | Status: DC
  Administered 2022-04-19 – 2022-04-20 (×2): 0.4 mg via ORAL
  Filled 2022-04-19 (×2): qty 1

## 2022-04-19 MED ORDER — MAGNESIUM SULFATE 2 GM/50ML IV SOLN
2.0000 g | Freq: Once | INTRAVENOUS | Status: AC
  Administered 2022-04-19: 2 g via INTRAVENOUS
  Filled 2022-04-19: qty 50

## 2022-04-19 NOTE — TOC Benefit Eligibility Note (Signed)
Patient Teacher, English as a foreign language completed.    The patient is currently admitted and upon discharge could be taking dofetilide (Tikosyn) 500 mcg capsules.  The current 30 day co-pay is $47.00.   The patient is insured through [place], [place] Patient Advocate Specialist [place] Patient Advocate Team Direct Number: [phone]  Fax: [phone]

## 2022-04-19 NOTE — Progress Notes (Addendum)
RN reached out regarding some pauses on tele.    He is in/out of AFib/AFlutter, post conversion pauses of 4-4.6 seconds. In AFib noted wide complex betas/grouped, these are irregular and only seen in AF mostly likely are BBB morphology, not NSVTs. Post dose EKG QT/QTc was OK  He in sinus is SB at rest 40's, with ambulation to the 60's-70's. On Toprol at home None here, last dose was home yesterday AM about 0700-0800  Discussed with Dr. Curt Bears Follow tele closely Continue Tikosyn. Do not hold for bradycardia or pauses  Tommye Standard, PA-C

## 2022-04-19 NOTE — Telephone Encounter (Signed)
Pharmacy Patient Advocate Encounter  Insurance verification completed.    The patient is insured through [employer] Part D   The patient is currently admitted and ran test claims for the following: dofetilide (Tikosyn) 500 mcg capsules..  Copays and coinsurance results were relayed to Inpatient clinical team.

## 2022-04-19 NOTE — Progress Notes (Addendum)
Progress Note  Patient Name: Allen [name] Date of Encounter: 04/19/2022  [facility] Cardiologist: Dr. Bettina Gavia  Subjective   Feels well  Inpatient Medications    Scheduled Meds:  acyclovir  400 mg Oral BID   dofetilide  500 mcg Oral BID   finasteride  5 mg Oral Daily   levothyroxine  150 mcg Oral QAC breakfast   simvastatin  10 mg Oral QHS   sodium chloride flush  3 mL Intravenous Q12H   tamsulosin  0.4 mg Oral q AM   warfarin  4 mg Oral Once per day on Sun Mon Tue Wed Thu Sat   [START ON 04/21/2022] warfarin  6 mg Oral Once per day on Fri   Warfarin - Pharmacist Dosing Inpatient   Does not apply q1600   Continuous Infusions:  sodium chloride     PRN Meds: sodium chloride, sodium chloride flush   Vital Signs    Vitals:   04/18/22 1922 04/19/22 0046 04/19/22 0538 04/19/22 0753  BP: (!) 140/78 112/63 121/76 111/70  Pulse: (!) 52 (!) 46 63 (!) 46  Resp: '17 16 16 16  '$ Temp: 97.7 F (36.5 C) 97.7 F (36.5 C) 97.6 F (36.4 C) (!) 97.5 F (36.4 C)  TempSrc: Oral Oral Oral Oral  SpO2: 98% 94% 95% 96%  Weight:      Height:        Intake/Output Summary (Last 24 hours) at 04/19/2022 0802 Last data filed at 04/18/2022 1804 Gross per 24 hour  Intake 360 ml  Output --  Net 360 ml      04/18/2022    4:24 PM 04/18/2022   10:34 AM 02/14/2022   11:32 AM  Last 3 Weights  Weight (lbs) 198 lb 3.1 oz 198 lb 195 lb  Weight (kg) 89.9 kg 89.812 kg 88.451 kg      Telemetry    SB/SR, mostly 40's > 50's/60's - Personally Reviewed  ECG    SB 48bpm, QTc 412m - Personally Reviewed with Dr. CCurt Bears Physical Exam   GEN: No acute distress.   Neck: No JVD Cardiac: RRR, no murmurs, rubs, or gallops.  Respiratory: CTA b/l. GI: Soft, nontender, non-distended  MS: No edema; No deformity. Neuro:  Nonfocal  Psych: Normal affect   Labs    High Sensitivity Troponin:  No results for input(s): "TROPONINIHS" in the last 720 hours.   Chemistry Recent Labs  Lab  04/18/22 1229 04/19/22 0602  NA 136 134*  K 4.7 4.1  CL 105 105  CO2 25 24  GLUCOSE 98 102*  BUN 12 11  CREATININE 1.00 1.03  CALCIUM 8.8* 8.4*  MG 2.1 2.0  GFRNONAA >60 >60  ANIONGAP 6 5    Lipids No results for input(s): "CHOL", "TRIG", "HDL", "LABVLDL", "LDLCALC", "CHOLHDL" in the last 168 hours.  HematologyNo results for input(s): "WBC", "RBC", "HGB", "HCT", "MCV", "MCH", "MCHC", "RDW", "PLT" in the last 168 hours. Thyroid No results for input(s): "TSH", "FREET4" in the last 168 hours.  BNPNo results for input(s): "BNP", "PROBNP" in the last 168 hours.  DDimer No results for input(s): "DDIMER" in the last 168 hours.   Radiology    No results found.  Cardiac Studies   07/07/2021: TTE 1. GLS -15.6. Left ventricular ejection fraction, by estimation, is 60 to  65%. The left ventricle has normal function. The left ventricle has no  regional wall motion abnormalities. The left ventricular internal cavity  size was mildly dilated. There is  mild left  ventricular hypertrophy. Left ventricular diastolic parameters  are consistent with Grade II diastolic dysfunction (pseudonormalization).   2. Right ventricular systolic function is normal. The right ventricular  size is normal. There is normal pulmonary artery systolic pressure.   3. The mitral valve is normal in structure. Mild mitral valve  regurgitation. No evidence of mitral stenosis.   4. The aortic valve is normal in structure. Aortic valve regurgitation is  mild. No aortic stenosis is present.   5. There is mild dilatation of the ascending aorta, measuring 39 mm.   6. The inferior vena cava is normal in size with greater than 50%  respiratory variability, suggesting right atrial pressure of 3 mmHg.   Patient Profile     81 y.o. male w/PMHx of HTN, SVT (ablated remotely) AAA (s/p EVAR), stroke, hypothyroidism, and AFib admitted for Tikosyn initiation  Assessment & Plan    Paroxysmal AFib CHA2DS2Vasc is 6, on  warfarin Tikosyn load is in progress K+ 4.1 Mag 2.0 Creat 1.03 QTc stable INR 2.7  arrived in SR Bradycardic, not on any nodal blocking agents here, home Toprol held No symptoms BP stable  HTN Looks ok Follow off toprol for now  For questions or updates, please contact [facility] Please consult [email or web address] for contact info under        Signed, Baldwin Jamaica, PA-C  04/19/2022, 8:02 AM    I have seen and examined this patient with Tommye Standard.  Agree with above, note added to reflect my findings. Feeling well today.  No complaints at this time.  GEN: Well nourished, well developed, in no acute distress  HEENT: normal  Neck: no JVD, carotid bruits, or masses Cardiac: RRR; no murmurs, rubs, or gallops,no edema  Respiratory:  clear to auscultation bilaterally, normal work of breathing GI: soft, nontender, nondistended, + BS MS: no deformity or atrophy  Skin: warm and dry Neuro:  Strength and sensation are intact Psych: euthymic mood, full affect   Paroxysmal atrial fibrillation: Admission to the hospital for dofetilide load.  QTc is remained stable.  We Athony Coppa continue anticoagulation.  CHA2DS2-VASc of 6.  Warfarin to be managed by pharmacy while in the hospital. Hypertension: Currently well controlled  Niyana Chesbro M. Jauan Wohl MD 04/19/2022 11:14 AM

## 2022-04-19 NOTE — Progress Notes (Signed)
Pharmacy: Dofetilide (Tikosyn) - Follow Up Assessment and Electrolyte Replacement  Pharmacy consulted to assist in monitoring and replacing electrolytes in this 81 y.o. male admitted on 04/18/2022 undergoing dofetilide initiation.  Labs:    Component Value Date/Time   K 4.1 04/19/2022 0602   MG 2.0 04/19/2022 0602     Plan: Potassium: K >/= 4: No additional supplementation needed  Magnesium: Mg 1.8-2: Give Mg 2 gm IV x1    Thank you for allowing pharmacy to participate in this patient's care   Hildred Laser, PharmD Clinical Pharmacist **Pharmacist phone directory can now be found on [email or web address] ([email or web address]).  Listed under [place].

## 2022-04-19 NOTE — H&P (Signed)
Primary Care Physician: [name], Allen Mt, MD Referring Physician:Dr. [name] is a 81 y.o. male with a h/o  significant for atrial fibrillation, CVA, hypertension, SVT post ablation.  He had an abdominal aortic aneurysm and is post EVAR.  He had multiple episodes of atrial fibrillation.  Had ablation several years ago for atrial fibrillation or SVT.  He has palpitations on a daily basis.  He has a Linq monitor that showed episodes of atrial fibrillation and was on Multaq very shortly months ago. He did not feel well on Multaq.  He was seen by Dr. Curt Bears 6/13 and Tikosyn admission was discussed. He is now here now  for Tikosyn admit. He is in Sinus brady at 45 bpm with a qtc interval of 392 ms. He is on warfarin and has had 5 weeks of therapeutic INR's as of this am. No benadryl use.    Today, he denies symptoms of palpitations, chest pain, shortness of breath, orthopnea, PND, lower extremity edema, dizziness, presyncope, syncope, or neurologic sequela. The patient is tolerating medications without difficulties and is otherwise without complaint today.        Past Medical History:  Diagnosis Date   AAA (abdominal aortic aneurysm) ([place])     Atrial fibrillation ([place])     Cardiomyopathy, nonischemic ([place]) 10/28/2018   CHF (congestive heart failure) ([place])     CVA (cerebral infarction) 1969   Hypertension     Irregular heart beat     Malaise and fatigue 11/05/2019   Murmur 11/05/2019   Palpitations 11/05/2019   Prostatitis     PSVT (paroxysmal supraventricular tachycardia) ([place]) 05/22/2017    Formatting of this note might be different from the original. Prior ablation 2006   Thyroid disease      Hypothyrodidism   Varicose veins           Past Surgical History:  Procedure Laterality Date   ABDOMINAL AORTIC ANEURYSM REPAIR       ABLATION       CAROTID ENDARTERECTOMY   November 21, 2006    Left cea   ENDOVENOUS ABLATION SAPHENOUS VEIN W/ LASER Left 11-28-2012    left  greater saphenous vein by Curt Jews MD   ENDOVENOUS ABLATION SAPHENOUS VEIN W/ LASER Right 12-26-2012    right gretaer saphenous vein by Curt Jews MD   INGUINAL HERNIA REPAIR        X's  4   INGUINAL HERNIA REPAIR   09-2011    left side   stab phlebectomy Left 03-13-2013    10-20 incisions by Curt Jews MD   TONSILLECTOMY AND ADENOIDECTOMY                Current Outpatient Medications  Medication Sig Dispense Refill   acyclovir (ZOVIRAX) 800 MG tablet Take 400 mg by mouth 2 (two) times daily.       finasteride (PROSCAR) 5 MG tablet Take 1 tablet by mouth daily.       levothyroxine (SYNTHROID) 150 MCG tablet Take 150 mcg by mouth daily before breakfast.       lisinopril (PRINIVIL,ZESTRIL) 10 MG tablet Take 0.5 tablet (5 mg) by mouth daily       metoprolol succinate (TOPROL-XL) 50 MG 24 hr tablet Take 1 tablet (50 mg total) by mouth daily. Take with or immediately following a meal. 90 tablet 3   polyethylene glycol powder (GLYCOLAX/MIRALAX) 17 GM/SCOOP powder as needed for mild constipation.  psyllium (METAMUCIL) 58.6 % packet TAKE 1 TABLESPOONFUL BY MOUTH DAILY (MIX IN 8 OUNCES OF WATER OR JUICE AND DRINK)       silodosin (RAPAFLO) 8 MG CAPS capsule Take 8 mg by mouth at bedtime.       simvastatin (ZOCOR) 20 MG tablet Take 10 mg by mouth at bedtime.       Sodium Hyaluronate, oral, (HYALURONIC ACID) 100 MG CAPS Take 100 mg by mouth at bedtime.       tadalafil (CIALIS) 5 MG tablet Take 5 mg by mouth daily.       Tamsulosin HCl (FLOMAX) 0.4 MG CAPS Take 0.4 mg by mouth at bedtime. Reported on 01/04/2016       warfarin (COUMADIN) 4 MG tablet TAKE 1/2 TO 1 (ONE-HALF TO ONE) TABLET BY MOUTH ONCE DAILY AS DIRECTED BY  COUMADIN  CLINIC 90 tablet 0    No current facility-administered medications for this encounter.           Allergies  Allergen Reactions   Digoxin Other (See Comments)      Rapid heart beat, chest pressure Rapid heart beat, chest pressure     Other Other (See  Comments)      Rapid heart rate,chest pressure Rapid heart rate,chest pressure        Social History         Socioeconomic History   Marital status: Divorced      Spouse name: Not on file   Number of children: Not on file   Years of education: Not on file   Highest education level: Not on file  Occupational History   Not on file  Tobacco Use   Smoking status: Former      Types: Cigarettes      Quit date: 09/04/1993      Years since quitting: 28.6      Passive exposure: Past   Smokeless tobacco: Never  Vaping Use   Vaping Use: Never used  Substance and Sexual Activity   Alcohol use: Yes      Alcohol/week: 2.0 - 4.0 standard drinks of alcohol      Types: 2 - 4 Glasses of wine per week   Drug use: No   Sexual activity: Not on file  Other Topics Concern   Not on file  Social History Narrative   Not on file    Social Determinants of Health    Financial Resource Strain: Not on file  Food Insecurity: Not on file  Transportation Needs: Not on file  Physical Activity: Not on file  Stress: Not on file  Social Connections: Not on file  Intimate Partner Violence: Not on file           Family History  Problem Relation Age of Onset   Stroke Father     Angina Mother     Hypertension Sister     Heart disease Sister        ROS- All systems are reviewed and negative except as per the HPI above   Physical Exam:    Vitals:    04/18/22 1034  Pulse: (!) 45  Weight: 89.8 kg  Height: 6' (1.829 m)       Wt Readings from Last 3 Encounters:  04/18/22 89.8 kg  02/14/22 88.5 kg  01/03/22 88.9 kg      Labs: Recent Labs       Lab Results  Component Value Date    NA 136 12/26/2021    K 4.8 12/26/2021  CL 101 12/26/2021    CO2 23 12/26/2021    GLUCOSE 92 12/26/2021    BUN 12 12/26/2021    CREATININE 1.01 12/26/2021    CALCIUM 8.8 12/26/2021      Recent Labs       Lab Results  Component Value Date    INR 2.9 04/18/2022      Recent Labs       Lab Results   Component Value Date    CHOL 107 12/26/2021    HDL 43 12/26/2021    LDLCALC 50 12/26/2021    TRIG 64 12/26/2021          GEN- The patient is well appearing, alert and oriented x 3 today.   Head- normocephalic, atraumatic Eyes-  Sclera clear, conjunctiva pink Ears- hearing intact Oropharynx- clear Neck- supple, no JVP Lymph- no cervical lymphadenopathy Lungs- Clear to ausculation bilaterally, normal work of breathing Heart- Regular rate and rhythm, no murmurs, rubs or gallops, PMI not laterally displaced GI- soft, NT, ND, + BS Extremities- no clubbing, cyanosis, or edema MS- no significant deformity or atrophy Skin- no rash or lesion Psych- euthymic mood, full affect Neuro- strength and sensation are intact   EKG-Vent. rate 45 BPM PR interval 206 ms QRS duration 88 ms QT/QTcB 454/392 ms P-R-T axes 71 36 62 Sinus bradycardia Otherwise normal ECG When compared with ECG of 19-Nov-2006 15:01, PREVIOUS ECG IS PRESENT       Assessment and Plan:  1. Afib  Increase in afib burden In sinus brady today  Here for tikosyn admit No benadryl use General education re tikosyn use and precautions Bmet/mag with  acceptable K+/mag today     2. CHA2DS2VASc  score of at least 4  Has had therapeutic 5 weekly  INR levels with INR of 2.9 today    3. HTN  Elevated today Pt states usually low in afib at home         -----------------------  I have seen, examined the patient, and reviewed the above assessment and plan.    Allen [name] is an 81 year old man with atrial fibrillation who presents for Tikosyn admission.  His atrial fibrillation is symptomatic.  His QTc and creatinine are stable for initial loading dose.  GEN- The patient is well appearing, alert and oriented x 3    HEENT: normocephalic, atraumatic; sclera clear, conjunctiva pink; hearing intact; oropharynx clear; neck supple  Lungs- Clear to ausculation bilaterally, normal work of breathing.  No wheezes, rales,  rhonchi Heart- Regular rate and rhythm, no murmurs, rubs or gallops  GI- soft, non-tender, non-distended, bowel sounds present  Extremities- no clubbing, cyanosis, or edema; DP/PT/radial pulses 2+ bilaterally, groin without hematoma/bruit MS- no significant deformity or atrophy Skin- warm and dry, no rash or lesion Psych- euthymic mood, full affect Neuro- strength and sensation are intact  EKG personally reviewed and shows a QTc of 392 ms. Potassium 4.7.  Creatinine 1.0.  #Symptomatic atrial fibrillation Proceed with Tikosyn initiation.  Creatinine and QTc stable for initial loading dose.   Vickie Epley, MD 04/19/2022 8:24 AM

## 2022-04-19 NOTE — Progress Notes (Signed)
Pt having mult pauses, greatest 4.79. Pt going in and out of afib/SR as well. He does state he feels weak when these are happening with ambulation and while sitting in chair. I have made Georgeanna Harrison PA aware. Will see patient. I have asked patient to get in bed and not get up without assistance.

## 2022-04-19 NOTE — Progress Notes (Signed)
[place] for warfarin Indication: atrial fibrillation  Allergies  Allergen Reactions   Digoxin Other (See Comments)    Rapid heart beat, chest pressure Rapid heart beat, chest pressure    Other Other (See Comments)    Rapid heart rate,chest pressure Rapid heart rate,chest pressure     Vital Signs: Temp: 97.5 F (36.4 C) (08/16 0753) Temp Source: Oral (08/16 0753) BP: 111/70 (08/16 0753) Pulse Rate: 46 (08/16 0753)  Labs: Recent Labs    04/18/22 0851 04/18/22 1229 04/19/22 0602  LABPROT  --   --  28.8*  INR 2.9  --  2.7*  CREATININE  --  1.00 1.03     Estimated Creatinine Clearance: 62.8 mL/min (by C-G formula based on SCr of 1.03 mg/dL).   Medical History: Past Medical History:  Diagnosis Date   AAA (abdominal aortic aneurysm) ([place])    Atrial fibrillation ([facility])    Cardiomyopathy, nonischemic ([place]) 10/28/2018   CHF (congestive heart failure) ([place])    CVA (cerebral infarction) 1969   Hypertension    Irregular heart beat    Malaise and fatigue 11/05/2019   Murmur 11/05/2019   Palpitations 11/05/2019   Prostatitis    PSVT (paroxysmal supraventricular tachycardia) ([place]) 05/22/2017   Formatting of this note might be different from the original. Prior ablation 2006   Thyroid disease    Hypothyrodidism   Varicose veins       Assessment: 58 you male here for Tikosyn initiation on warfarin PTA for afib. Pharmacy consulted to dose warfarin -INR = 2.7  Home dose: '4mg'$ /day except take '6mg'$  on F  Goal of Therapy:  INR 2-3 Monitor platelets by anticoagulation protocol: Yes   Plan:  -Continue warfarin at the home dose  -Daily PT/INR   Hildred Laser, PharmD Clinical Pharmacist **Pharmacist phone directory can now be found on [email or web address] ([email or web address]).  Listed under [place].

## 2022-04-20 DIAGNOSIS — I480 Paroxysmal atrial fibrillation: Secondary | ICD-10-CM | POA: Diagnosis not present

## 2022-04-20 LAB — PROTIME-INR
INR: 2.6 — ABNORMAL HIGH (ref 0.8–1.2)
Prothrombin Time: 27.3 seconds — ABNORMAL HIGH (ref 11.4–15.2)

## 2022-04-20 LAB — BASIC METABOLIC PANEL
Anion gap: 8 (ref 5–15)
BUN: 14 mg/dL (ref 8–23)
CO2: 21 mmol/L — ABNORMAL LOW (ref 22–32)
Calcium: 8.5 mg/dL — ABNORMAL LOW (ref 8.9–10.3)
Chloride: 104 mmol/L (ref 98–111)
Creatinine, Ser: 1.12 mg/dL (ref 0.61–1.24)
GFR, Estimated: >60 mL/min (ref >60)
Glucose, Bld: 99 mg/dL (ref 70–99)
Potassium: 3.9 mmol/L (ref 3.5–5.1)
Sodium: 133 mmol/L — ABNORMAL LOW (ref 135–145)

## 2022-04-20 LAB — MAGNESIUM: Magnesium: 2.3 mg/dL (ref 1.7–2.4)

## 2022-04-20 MED ORDER — POTASSIUM CHLORIDE CRYS ER 20 MEQ PO TBCR
20.0000 meq | EXTENDED_RELEASE_TABLET | Freq: Once | ORAL | Status: AC
Start: 2022-04-20 — End: 2022-04-20
  Administered 2022-04-20: 20 meq via ORAL
  Filled 2022-04-20: qty 1

## 2022-04-20 NOTE — Progress Notes (Signed)
ANTICOAGULATION CONSULT NOTE  Pharmacy Consult for warfarin Indication: atrial fibrillation  Allergies  Allergen Reactions   Digoxin Other (See Comments)    Rapid heart beat, chest pressure Rapid heart beat, chest pressure    Other Other (See Comments)    Rapid heart rate,chest pressure Rapid heart rate,chest pressure     Vital Signs: Temp: 98 F (36.7 C) (08/17 0830) Temp Source: Oral (08/17 0830) BP: 97/74 (08/17 0830) Pulse Rate: 97 (08/17 0830)  Labs: Recent Labs    04/18/22 0851 04/18/22 1229 04/19/22 0602 04/20/22 0321  LABPROT  --   --  28.8* 27.3*  INR 2.9  --  2.7* 2.6*  CREATININE  --  1.00 1.03 1.12     Estimated Creatinine Clearance: 57.7 mL/min (by C-G formula based on SCr of 1.12 mg/dL).   Medical History: Past Medical History:  Diagnosis Date   AAA (abdominal aortic aneurysm) ([place])    Atrial fibrillation (H[facility])    Cardiomyopathy, nonischemic ([place]) 10/28/2018   CHF (congestive heart failure) ([place])    CVA (cerebral infarction) 1969   Hypertension    Irregular heart beat    Malaise and fatigue 11/05/2019   Murmur 11/05/2019   Palpitations 11/05/2019   Prostatitis    PSVT (paroxysmal supraventricular tachycardia) ([place]) 05/22/2017   Formatting of this note might be different from the original. Prior ablation 2006   Thyroid disease    Hypothyrodidism   Varicose veins       Assessment: 32 you male here for Tikosyn initiation on warfarin PTA for afib. Pharmacy consulted to dose warfarin -INR = 2.6  Home dose: '4mg'$ /day except take '6mg'$  on F  Goal of Therapy:  INR 2-3 Monitor platelets by anticoagulation protocol: Yes   Plan:  -Continue warfarin at the home dose  -Daily PT/INR   Hildred Laser, PharmD Clinical Pharmacist **Pharmacist phone directory can now be found on [email or web address] ([email or web address]).  Listed under [place].

## 2022-04-20 NOTE — Progress Notes (Signed)
Post dose EKG is reviewed with Dr. Curt Bears AFib with some aberrant beats, QTc looks OK Continue Tikosyn dose tonight  Telemetry reviewed  In/out of AFib/flutter frequently less and shorter post conversion pauses off Toprol.  Tommye Standard, PA-c

## 2022-04-20 NOTE — Care Management (Signed)
1435 04-20-22 Case Manager spoke with the patient regarding Tikosyn cost. Patient is agreeable to initial Rx fill via the Center and Rx refills to be sent to [facility]. Patient had concerns regarding his bill. Patient states the [facility] should be paying for his hospitalization- VA benefit has not been added to the claim. Case Manager did call the Pre-Service center at [phone] for additional assistance to see if claim can be added. Patient states he has notified the [facility]. Case Manager will continue to follow for transition of care needs.

## 2022-04-20 NOTE — Progress Notes (Addendum)
Progress Note  Patient Name: Allen [name] Date of Encounter: 04/20/2022  [facility] Cardiologist: Dr. Bettina Gavia  Subjective   Feels well, a bit anxious about in/out of AF still  Inpatient Medications    Scheduled Meds:  acyclovir  400 mg Oral BID   dofetilide  500 mcg Oral BID   finasteride  5 mg Oral Daily   levothyroxine  150 mcg Oral QAC breakfast   simvastatin  10 mg Oral QHS   sodium chloride flush  3 mL Intravenous Q12H   tamsulosin  0.4 mg Oral QHS   warfarin  4 mg Oral Once per day on Sun Mon Tue Wed Thu Sat   [START ON 04/21/2022] warfarin  6 mg Oral Once per day on Fri   Warfarin - Pharmacist Dosing Inpatient   Does not apply q1600   Continuous Infusions:  sodium chloride     PRN Meds: sodium chloride, sodium chloride flush   Vital Signs    Vitals:   04/19/22 1147 04/19/22 1747 04/19/22 2313 04/20/22 0621  BP: 114/70 121/85 124/79 120/81  Pulse: 68 93 (!) 38 88  Resp: '17 16 16 17  '$ Temp: 98 F (36.7 C) 98.1 F (36.7 C) 98 F (36.7 C) 98.1 F (36.7 C)  TempSrc: Oral Axillary Oral Oral  SpO2: 98% 98% 100% 96%  Weight:      Height:        Intake/Output Summary (Last 24 hours) at 04/20/2022 7026 Last data filed at 04/19/2022 2100 Gross per 24 hour  Intake 240 ml  Output --  Net 240 ml      04/18/2022    4:24 PM 04/18/2022   10:34 AM 02/14/2022   11:32 AM  Last 3 Weights  Weight (lbs) 198 lb 3.1 oz 198 lb 195 lb  Weight (kg) 89.9 kg 89.812 kg 88.451 kg      Telemetry    SB/SR, mostly 40's > 50's/60's and AFib 80's-110's with post conversion pauses 4-5.5 seconds - Personally Reviewed  ECG    AFlutter > SB 2.6 second conversion pause, QTc is OK - Personally Reviewed with Dr. Curt Bears  Physical Exam   GEN: No acute distress.   Neck: No JVD Cardiac: irreg-irreg, no murmurs, rubs, or gallops.  Respiratory: CTA b/l. GI: Soft, nontender, non-distended  MS: No edema; No deformity. Neuro:  Nonfocal  Psych: Normal affect   Labs    High  Sensitivity Troponin:  No results for input(s): "TROPONINIHS" in the last 720 hours.   Chemistry Recent Labs  Lab 04/18/22 1229 04/19/22 0602 04/20/22 0321  NA 136 134* 133*  K 4.7 4.1 3.9  CL 105 105 104  CO2 25 24 21*  GLUCOSE 98 102* 99  BUN '12 11 14  '$ CREATININE 1.00 1.03 1.12  CALCIUM 8.8* 8.4* 8.5*  MG 2.1 2.0 2.3  GFRNONAA >60 >60 >60  ANIONGAP '6 5 8    '$ Lipids No results for input(s): "CHOL", "TRIG", "HDL", "LABVLDL", "LDLCALC", "CHOLHDL" in the last 168 hours.  HematologyNo results for input(s): "WBC", "RBC", "HGB", "HCT", "MCV", "MCH", "MCHC", "RDW", "PLT" in the last 168 hours. Thyroid No results for input(s): "TSH", "FREET4" in the last 168 hours.  BNPNo results for input(s): "BNP", "PROBNP" in the last 168 hours.  DDimer No results for input(s): "DDIMER" in the last 168 hours.   Radiology    CUP PACEART REMOTE DEVICE CHECK  Result Date: 04/19/2022 ILR summary report received. Battery status OK. Normal device function. No new symptom, tachy, brady,  episodes. 55 new AF episodes, controlled rates, burden23.3%, Warfarin.  5 pause events, 6-7sec in duration, 4 of the 5 are conversion pauses from AF/flutter to SB.  Pt admitted 8/15 for Tikosyn Monthly summary reports and ROV/PRN LA   Cardiac Studies   07/07/2021: TTE 1. GLS -15.6. Left ventricular ejection fraction, by estimation, is 60 to  65%. The left ventricle has normal function. The left ventricle has no  regional wall motion abnormalities. The left ventricular internal cavity  size was mildly dilated. There is  mild left ventricular hypertrophy. Left ventricular diastolic parameters  are consistent with Grade II diastolic dysfunction (pseudonormalization).   2. Right ventricular systolic function is normal. The right ventricular  size is normal. There is normal pulmonary artery systolic pressure.   3. The mitral valve is normal in structure. Mild mitral valve  regurgitation. No evidence of mitral stenosis.   4.  The aortic valve is normal in structure. Aortic valve regurgitation is  mild. No aortic stenosis is present.   5. There is mild dilatation of the ascending aorta, measuring 39 mm.   6. The inferior vena cava is normal in size with greater than 50%  respiratory variability, suggesting right atrial pressure of 3 mmHg.   Patient Profile     81 y.o. male w/PMHx of HTN, SVT (ablated remotely) AAA (s/p EVAR), stroke, hypothyroidism, and AFib admitted for Tikosyn initiation  Assessment & Plan    Paroxysmal AFib CHA2DS2Vasc is 6, on warfarin Tikosyn load is in progress K+ 3.9 replaced Mag 2.3 Creat 1.12 QTc stable INR 2.6  arrived in SR In/out of AFib, rates 90's110s, his sinus rates 40's-60's He has post conversion pauses 4-5.5 seconds that he is aware of No syncope  Dr. Curt Bears discussed with the patient at length this morning further strategy options (as I did yesterday as well) Tikosyn load is only 1/2 way, continue on Change to amiodarone as either long term option and potential side effects Amiodarone as bridge to ablation PPM as a last option if unable to obtain durable rhythm management  Craven Crean continue to pursue Tikosyn load    HTN Looks ok off Toprol   For questions or updates, please contact [facility] Please consult [email or web address] for contact info under        Signed, Baldwin Jamaica, PA-C  04/20/2022, 8:12 AM    I have seen and examined this patient with Tommye Standard.  Agree with above, note added to reflect my findings.  Patient continues to go in and out of atrial fibrillation/flutter.  Is in atrial fibrillation currently.  He is quite anxious about his atrial fibrillation and his postconversion pauses.  GEN: Well nourished, well developed, in no acute distress  HEENT: normal  Neck: no JVD, carotid bruits, or masses Cardiac: Irregular; no murmurs, rubs, or gallops,no edema  Respiratory:  clear to auscultation bilaterally, normal work of breathing GI:  soft, nontender, nondistended, + BS MS: no deformity or atrophy  Skin: warm and dry Neuro:  Strength and sensation are intact Psych: euthymic mood, full affect   Paroxysmal atrial fibrillation: Currently on dofetilide 500 mcg twice daily.  QTc has remained stable.  For now, he would like to continue his loading of dofetilide.  He understands that if he does not remain in rhythm on dofetilide, we could switch to amiodarone as either destination therapy or a bridge to ablation.  We Maricel Swartzendruber have further discussions with him later this afternoon. Hypertension: Continue home medications  Runa Whittingham M. Unnamed Hino MD 04/20/2022  9:46 AM

## 2022-04-20 NOTE — Plan of Care (Signed)

## 2022-04-21 ENCOUNTER — Other Ambulatory Visit (HOSPITAL_COMMUNITY): Payer: Self-pay

## 2022-04-21 DIAGNOSIS — I480 Paroxysmal atrial fibrillation: Secondary | ICD-10-CM | POA: Diagnosis not present

## 2022-04-21 LAB — BASIC METABOLIC PANEL
Anion gap: 11 (ref 5–15)
BUN: 17 mg/dL (ref 8–23)
CO2: 21 mmol/L — ABNORMAL LOW (ref 22–32)
Calcium: 8.6 mg/dL — ABNORMAL LOW (ref 8.9–10.3)
Chloride: 101 mmol/L (ref 98–111)
Creatinine, Ser: 1.14 mg/dL (ref 0.61–1.24)
GFR, Estimated: >60 mL/min (ref >60)
Glucose, Bld: 102 mg/dL — ABNORMAL HIGH (ref 70–99)
Potassium: 4.1 mmol/L (ref 3.5–5.1)
Sodium: 133 mmol/L — ABNORMAL LOW (ref 135–145)

## 2022-04-21 LAB — MAGNESIUM: Magnesium: 1.9 mg/dL (ref 1.7–2.4)

## 2022-04-21 LAB — PROTIME-INR
INR: 2.6 — ABNORMAL HIGH (ref 0.8–1.2)
Prothrombin Time: 27.4 seconds — ABNORMAL HIGH (ref 11.4–15.2)

## 2022-04-21 MED ORDER — MAGNESIUM SULFATE 2 GM/50ML IV SOLN: 2.0000 g | Freq: Once | INTRAVENOUS | Status: DC

## 2022-04-21 MED ORDER — MAGNESIUM OXIDE -MG SUPPLEMENT 400 (240 MG) MG PO TABS
400.0000 mg | ORAL_TABLET | Freq: Once | ORAL | Status: AC
  Administered 2022-04-21: 400 mg via ORAL
  Filled 2022-04-21: qty 1

## 2022-04-21 MED ORDER — DOFETILIDE 500 MCG PO CAPS
500.0000 ug | ORAL_CAPSULE | Freq: Two times a day (BID) | ORAL | 5 refills | Status: DC
  Filled 2022-04-21: qty 60, 30d supply, fill #0

## 2022-04-21 NOTE — Progress Notes (Signed)
Carelink Summary Report / Loop Recorder 

## 2022-04-21 NOTE — Progress Notes (Signed)
ANTICOAGULATION CONSULT NOTE  Pharmacy Consult for warfarin Indication: atrial fibrillation  Allergies  Allergen Reactions   Digoxin Other (See Comments)    Rapid heart beat, chest pressure Rapid heart beat, chest pressure    Other Other (See Comments)    Rapid heart rate,chest pressure Rapid heart rate,chest pressure     Vital Signs: Temp: 97.5 F (36.4 C) (08/18 0800) Temp Source: Oral (08/18 0800) BP: 122/74 (08/18 0800) Pulse Rate: 116 (08/18 0800)  Labs: Recent Labs    04/19/22 0602 04/20/22 0321 04/21/22 0343  LABPROT 28.8* 27.3* 27.4*  INR 2.7* 2.6* 2.6*  CREATININE 1.03 1.12 1.14     Estimated Creatinine Clearance: 56.7 mL/min (by C-G formula based on SCr of 1.14 mg/dL).   Medical History: Past Medical History:  Diagnosis Date   AAA (abdominal aortic aneurysm) ([place])    Atrial fibrillation (H[facility])    Cardiomyopathy, nonischemic ([place]) 10/28/2018   CHF (congestive heart failure) ([place])    CVA (cerebral infarction) 1969   Hypertension    Irregular heart beat    Malaise and fatigue 11/05/2019   Murmur 11/05/2019   Palpitations 11/05/2019   Prostatitis    PSVT (paroxysmal supraventricular tachycardia) ([place]) 05/22/2017   Formatting of this note might be different from the original. Prior ablation 2006   Thyroid disease    Hypothyrodidism   Varicose veins       Assessment: 52 you male here for Tikosyn initiation on warfarin PTA for afib. Pharmacy consulted to dose warfarin. Patient for discharge today -INR = 2.6  Home dose: '4mg'$ /day except take '6mg'$  on F  Goal of Therapy:  INR 2-3 Monitor platelets by anticoagulation protocol: Yes   Plan:  -Continue warfarin at the home dose  -Daily PT/INR   Hildred Laser, PharmD Clinical Pharmacist **Pharmacist phone directory can now be found on [email or web address] ([email or web address]).  Listed under [place].

## 2022-04-21 NOTE — Care Management (Signed)
1031 04-21-22 Case Manager was able to speak with someone in the [place] this morning. The office was able to add the [place] Benefit to the encounter. Patient has spoken to the [place] upon admission. No further needs identified from Case Manager at this time.

## 2022-04-21 NOTE — Progress Notes (Addendum)
Pharmacy: Dofetilide (Tikosyn) - Follow Up Assessment and Electrolyte Replacement  Pharmacy consulted to assist in monitoring and replacing electrolytes in this 81 y.o. male admitted on 04/18/2022 undergoing dofetilide initiation.  Labs:    Component Value Date/Time   K 4.1 04/21/2022 0343   MG 1.9 04/21/2022 0343     Plan: Potassium: K >/= 4: No additional supplementation needed  Magnesium: -IV has been removed. Will give '400mg'$  Mg Ox   Thank you for allowing pharmacy to participate in this patient's care   Hildred Laser, PharmD Clinical Pharmacist **Pharmacist phone directory can now be found on [email or web address] ([email or web address]).  Listed under [place].

## 2022-04-21 NOTE — Progress Notes (Signed)
Dr. Curt Bears has seen the patient this AM QTc stable In/out AFlutter, post conversion pauses much shorter, one late last night 5.2 sec Plan to continue w/Tikosyn Anticipate discharge later today with usual follow up  Tommye Standard, PA-C

## 2022-04-21 NOTE — Plan of Care (Signed)

## 2022-04-21 NOTE — Care Management Important Message (Signed)
Important Message  Patient Details  Name: Allen [name] MRN: [id number] Date of Birth: Apr 17, 1941   Medicare Important Message Given:  Yes     Orbie Pyo 04/21/2022, 2:40 PM

## 2022-04-21 NOTE — Discharge Summary (Addendum)
ELECTROPHYSIOLOGY PROCEDURE DISCHARGE SUMMARY    Patient ID: Allen [name],  MRN: [id number], DOB/AGE: March 06, 1941 81 y.o.  Admit date: 04/18/2022 Discharge date: 04/21/2022  Primary Care Physician: Street, [name], MD Primary Cardiologist: Dr. Bettina Gavia Electrophysiologist: Dr. Curt Bears  Primary Discharge Diagnosis:  1.  paroxysmal atrial fibrillation/AFlutter (atypical) status post Tikosyn loading this admission      CHA2DS2Vasc is 6, on warfarin (follows with Dr. Munley/coumadin clinic)  Secondary Discharge Diagnosis:  HTN SVT Remote (ablated, ?perhaps not successfully by the patient's report) AAA Hx of EVAR Stroke old hypothyroidism  Allergies  Allergen Reactions   Digoxin Other (See Comments)    Rapid heart beat, chest pressure Rapid heart beat, chest pressure    Other Other (See Comments)    Rapid heart rate,chest pressure Rapid heart rate,chest pressure      Procedures This Admission:  1.  Tikosyn loading   Brief HPI: [name] is a 81 y.o. male with a past medical history as noted above.  He is followed by EP in the outpatient setting for treatment options of atrial fibrillation.  risks, benefits, and alternatives to Tikosyn were reviewed with the patient who wished to proceed.    Hospital Course:  The patient was admitted and Tikosyn was initiated.  Renal function and electrolytes were followed during the hospitalization.  The patient's QTc remained stable.  He has bee in/out of AFlutter through his stay.  Sinus rates initially 40's with post conversion pauses 4-5.5 seconds, though off his Toprol sinus rates 50's and conversion pauses also improved 2-3 seconds mostly.  On the day of discharge, he feels well, was examined by Dr Curt Bears who considered the patient stable for discharge to home.  Follow-up has been arranged with the AFib clinic in 1 week and with EP service in 4 weeks.    Tikosyn teaching was completed No new or additional  electrolyte replacement for home TOC Karah Caruthers do initial fill and transfer refills to [place] home metoprolol  Physical Exam: Vitals:   04/20/22 1604 04/20/22 1755 04/21/22 0502 04/21/22 0800  BP: (!) 143/86 (!) 152/89 120/80 122/74  Pulse: 87 74 (!) 55 (!) 116  Resp: '16 18 16 16  '$ Temp: 97.9 F (36.6 C) (!) 97.4 F (36.3 C) 97.9 F (36.6 C) (!) 97.5 F (36.4 C)  TempSrc: Oral Oral Oral Oral  SpO2: 95% 98% 98% 90%  Weight:      Height:         GEN- The patient is well appearing, alert and oriented x 3 today.   HEENT: normocephalic, atraumatic; sclera clear, conjunctiva pink; hearing intact; oropharynx clear; neck supple, no JVP Lymph- no cervical lymphadenopathy Lungs- CTA b/l, normal work of breathing.  No wheezes, rales, rhonchi Heart- irreg-irreg, no murmurs, rubs or gallops, PMI not laterally displaced GI- soft, non-tender, non-distended Extremities- no clubbing, cyanosis, or edema MS- no significant deformity or atrophy Skin- warm and dry, no rash or lesion Psych- euthymic mood, full affect Neuro- strength and sensation are intact   Labs:   Lab Results  Component Value Date   WBC 4.9 12/26/2021   HGB 13.4 12/26/2021   HCT 38.9 12/26/2021   MCV 93 12/26/2021   PLT 180 12/26/2021    Recent Labs  Lab 04/21/22 0343  NA 133*  K 4.1  CL 101  CO2 21*  BUN 17  CREATININE 1.14  CALCIUM 8.6*  GLUCOSE 102*     Discharge Medications:  Allergies as of  04/21/2022       Reactions   Digoxin Other (See Comments)   Rapid heart beat, chest pressure Rapid heart beat, chest pressure   Other Other (See Comments)   Rapid heart rate,chest pressure Rapid heart rate,chest pressure        Medication List     STOP taking these medications    metoprolol succinate 50 MG 24 hr tablet Commonly known as: TOPROL-XL       TAKE these medications    acyclovir 800 MG tablet Commonly known as: ZOVIRAX Take 400 mg by mouth 2 (two) times daily.    dofetilide 500 MCG capsule Commonly known as: TIKOSYN Take 1 capsule (500 mcg total) by mouth 2 (two) times daily.   finasteride 5 MG tablet Commonly known as: PROSCAR Take 1 tablet by mouth daily.   Hyaluronic Acid 100 MG Caps Take 100 mg by mouth at bedtime.   levothyroxine 150 MCG tablet Commonly known as: SYNTHROID Take 150 mcg by mouth daily before breakfast.   psyllium 58.6 % packet Commonly known as: METAMUCIL Take 1 packet by mouth 3 (three) times daily.   silodosin 8 MG Caps capsule Commonly known as: RAPAFLO Take 8 mg by mouth at bedtime.   simvastatin 20 MG tablet Commonly known as: ZOCOR Take 10 mg by mouth at bedtime.   tadalafil 5 MG tablet Commonly known as: CIALIS Take 5 mg by mouth daily.   tamsulosin 0.4 MG Caps capsule Commonly known as: FLOMAX Take 0.4 mg by mouth in the morning. Reported on 01/04/2016   warfarin 4 MG tablet Commonly known as: COUMADIN Take as directed. If you are unsure how to take this medication, talk to your nurse or doctor. Original instructions: TAKE 1/2 TO 1 (ONE-HALF TO ONE) TABLET BY MOUTH ONCE DAILY AS DIRECTED BY  COUMADIN  CLINIC What changed: See the new instructions.        Disposition: home Discharge Instructions     Diet - low sodium heart healthy   Complete by: As directed    Increase activity slowly   Complete by: As directed         Duration of Discharge Encounter: Greater than 30 minutes including physician time.  Venetia Night, PA-C 04/21/2022 11:56 AM  I have seen and examined this patient with Tommye Standard.  Agree with above, note added to reflect my findings.  Patient admitted to the hospital for dofetilide load.  Was noted to have postconversion pauses and thus metoprolol was stopped.  He continued to have episodes of atrial fibrillation and atrial flutter despite dofetilide.  He had chose to continue with dofetilide loading.  We Breckan Cafiero have him follow-up in atrial fibrillation clinic and  if he remains in atrial fibrillation/flutter, Javarious Elsayed likely plan to switch to amiodarone.  He would potentially be an ablation candidate if he would be agreeable, but for now he would prefer medication management.  GEN: Well nourished, well developed, in no acute distress  HEENT: normal  Neck: no JVD, carotid bruits, or masses Cardiac: RRR; no murmurs, rubs, or gallops,no edema  Respiratory:  clear to auscultation bilaterally, normal work of breathing GI: soft, nontender, nondistended, + BS MS: no deformity or atrophy  Skin: warm and dry Neuro:  Strength and sensation are intact Psych: euthymic mood, full affect     Lemont Sitzmann M. Tahja Liao MD 04/21/2022 12:11 PM

## 2022-04-27 ENCOUNTER — Telehealth: Payer: Self-pay | Admitting: *Deleted

## 2022-04-27 ENCOUNTER — Ambulatory Visit (HOSPITAL_COMMUNITY)
Admit: 2022-04-27 | Discharge: 2022-04-27 | Disposition: A | Discharge status: Home / Self Care | Payer: Medicare Other | Source: Ambulatory Visit | Attending: Nurse Practitioner | Admitting: Nurse Practitioner

## 2022-04-27 ENCOUNTER — Encounter (HOSPITAL_COMMUNITY): Payer: Self-pay | Admitting: Nurse Practitioner

## 2022-04-27 VITALS — BP 148/80 | HR 78 | Ht 72.0 in | Wt 194.8 lb

## 2022-04-27 DIAGNOSIS — R5383 Other fatigue: Secondary | ICD-10-CM | POA: Diagnosis not present

## 2022-04-27 DIAGNOSIS — I10 Essential (primary) hypertension: Secondary | ICD-10-CM | POA: Insufficient documentation

## 2022-04-27 DIAGNOSIS — I480 Paroxysmal atrial fibrillation: Secondary | ICD-10-CM | POA: Diagnosis not present

## 2022-04-27 DIAGNOSIS — D6869 Other thrombophilia: Secondary | ICD-10-CM | POA: Diagnosis not present

## 2022-04-27 DIAGNOSIS — I4892 Unspecified atrial flutter: Secondary | ICD-10-CM | POA: Insufficient documentation

## 2022-04-27 NOTE — Patient Instructions (Signed)
Stop tikosyn (dofetilide)

## 2022-04-27 NOTE — Telephone Encounter (Signed)
-----   Message from Juluis Mire, RN sent at 04/27/2022 11:52 AM EDT ----- Regarding: ablation Pt tikosyn was stopped today due to failure --- he wants to go ahead and be scheduled for ablation (was discussed in hospital during tikosyn loading).  Thanks [employer]

## 2022-04-27 NOTE — Progress Notes (Signed)
Primary Care Physician: Street, [name], MD Referring Physician:Dr. Arvle Grabe is a 81 y.o. male with a h/o  significant for atrial fibrillation, CVA, hypertension, SVT post ablation.  He had an abdominal aortic aneurysm and is post EVAR.  He had multiple episodes of atrial fibrillation.  Had ablation several years ago for atrial fibrillation or SVT.  He has palpitations on a daily basis.  He has a Linq monitor that showed episodes of atrial fibrillation and was on Multaq very shortly months ago. He did not feel well on Multaq.  He was seen by Dr. Curt Bears 6/13 and Tikosyn admission was discussed. He is now here now  for Tikosyn admit. He is in Sinus brady at 45 bpm with a qtc interval of 392 ms. He is on warfarin and has had 5 weeks of therapeutic INR's as of this am. No benadryl use.   Pt is back for one week f/u, 04/27/22, for tikosyn admit.   He had intermittent afib in the hospital on tikosyn. He also had some conversion pauses that were minimized with stopping BB. Dr. Curt Bears felt if he was out of rhythm today then stop tikosyn and for pt to consider amiodarone and/or abaltion. Pt states that the afternoon he went home he had some slurred speech but no other symptoms, he felt was from being overly tired as he did not get to sleep in the hospital. The next am after a good night sleep, his speech was normal.   Today, he denies symptoms of palpitations, chest pain, shortness of breath, orthopnea, PND, lower extremity edema, dizziness, presyncope, syncope, or neurologic sequela. The patient is tolerating medications without difficulties and is otherwise without complaint today.   Past Medical History:  Diagnosis Date   AAA (abdominal aortic aneurysm) ([place])    Atrial fibrillation ([place])    Cardiomyopathy, nonischemic ([place]) 10/28/2018   CHF (congestive heart failure) ([place])    CVA (cerebral infarction) 1969   Hypertension    Irregular heart beat    Malaise and fatigue 11/05/2019    Murmur 11/05/2019   Palpitations 11/05/2019   Prostatitis    PSVT (paroxysmal supraventricular tachycardia) ([place]) 05/22/2017   Formatting of this note might be different from the original. Prior ablation 2006   Thyroid disease    Hypothyrodidism   Varicose veins    Past Surgical History:  Procedure Laterality Date   ABDOMINAL AORTIC ANEURYSM REPAIR     ABLATION     CAROTID ENDARTERECTOMY  November 21, 2006   Left cea   ENDOVENOUS ABLATION SAPHENOUS VEIN W/ LASER Left 11-28-2012   left greater saphenous vein by Curt Jews MD   ENDOVENOUS ABLATION SAPHENOUS VEIN W/ LASER Right 12-26-2012   right gretaer saphenous vein by Curt Jews MD   INGUINAL HERNIA REPAIR     X's  4   INGUINAL HERNIA REPAIR  09-2011   left side   stab phlebectomy Left 03-13-2013   10-20 incisions by Curt Jews MD   TONSILLECTOMY AND ADENOIDECTOMY      Current Outpatient Medications  Medication Sig Dispense Refill   acyclovir (ZOVIRAX) 800 MG tablet Take 400 mg by mouth 2 (two) times daily.     finasteride (PROSCAR) 5 MG tablet Take 1 tablet by mouth daily.     levothyroxine (SYNTHROID) 150 MCG tablet Take 150 mcg by mouth daily before breakfast.     psyllium (METAMUCIL) 58.6 % packet Take 1 packet by mouth 3 (three) times daily.  silodosin (RAPAFLO) 8 MG CAPS capsule Take 8 mg by mouth at bedtime.     simvastatin (ZOCOR) 20 MG tablet Take 10 mg by mouth at bedtime.     Sodium Hyaluronate, oral, (HYALURONIC ACID) 100 MG CAPS Take 100 mg by mouth at bedtime.     tadalafil (CIALIS) 5 MG tablet Take 5 mg by mouth daily.     Tamsulosin HCl (FLOMAX) 0.4 MG CAPS Take 0.4 mg by mouth in the morning. Reported on 01/04/2016     warfarin (COUMADIN) 4 MG tablet TAKE 1/2 TO 1 (ONE-HALF TO ONE) TABLET BY MOUTH ONCE DAILY AS DIRECTED BY  COUMADIN  CLINIC (Patient taking differently: Take 4-6 mg by mouth as directed. TAKE 1 TABLET (4 MG) DAILY EXCEPT ON FRIDAYS TAKE 1.5 TABLETS (6 MG) AS DIRECTED BY COUMADIN  CLINIC) 90 tablet 0    No current facility-administered medications for this encounter.    Allergies  Allergen Reactions   Digoxin Other (See Comments)    Rapid heart beat, chest pressure Rapid heart beat, chest pressure    Other Other (See Comments)    Rapid heart rate,chest pressure Rapid heart rate,chest pressure     Social History   Socioeconomic History   Marital status: Divorced    Spouse name: Not on file   Number of children: Not on file   Years of education: Not on file   Highest education level: Not on file  Occupational History   Not on file  Tobacco Use   Smoking status: Former    Types: Cigarettes    Quit date: 09/04/1993    Years since quitting: 28.6    Passive exposure: Past   Smokeless tobacco: Never  Vaping Use   Vaping Use: Never used  Substance and Sexual Activity   Alcohol use: Yes    Alcohol/week: 2.0 - 4.0 standard drinks of alcohol    Types: 2 - 4 Glasses of wine per week   Drug use: No   Sexual activity: Not on file  Other Topics Concern   Not on file  Social History Narrative   Not on file   Social Determinants of Health   Financial Resource Strain: Not on file  Food Insecurity: Not on file  Transportation Needs: Not on file  Physical Activity: Not on file  Stress: Not on file  Social Connections: Not on file  Intimate Partner Violence: Not on file    Family History  Problem Relation Age of Onset   Stroke Father    Angina Mother    Hypertension Sister    Heart disease Sister     ROS- All systems are reviewed and negative except as per the HPI above  Physical Exam: Vitals:   04/27/22 1044  BP: (!) 148/80  Pulse: 78  Weight: 88.4 kg  Height: 6' (1.829 m)   Wt Readings from Last 3 Encounters:  04/27/22 88.4 kg  04/18/22 89.9 kg  04/18/22 89.8 kg    Labs: Lab Results  Component Value Date   NA 133 (L) 04/21/2022   K 4.1 04/21/2022   CL 101 04/21/2022   CO2 21 (L) 04/21/2022   GLUCOSE 102 (H) 04/21/2022   BUN 17 04/21/2022    CREATININE 1.14 04/21/2022   CALCIUM 8.6 (L) 04/21/2022   MG 1.9 04/21/2022   Lab Results  Component Value Date   INR 2.6 (H) 04/21/2022   Lab Results  Component Value Date   CHOL 107 12/26/2021   HDL 43 12/26/2021   LDLCALC  50 12/26/2021   TRIG 64 12/26/2021     GEN- The patient is well appearing, alert and oriented x 3 today.   Head- normocephalic, atraumatic Eyes-  Sclera clear, conjunctiva pink Ears- hearing intact Oropharynx- clear Neck- supple, no JVP Lymph- no cervical lymphadenopathy Lungs- Clear to ausculation bilaterally, normal work of breathing Heart- Regular rate and rhythm, no murmurs, rubs or gallops, PMI not laterally displaced GI- soft, NT, ND, + BS Extremities- no clubbing, cyanosis, or edema MS- no significant deformity or atrophy Skin- no rash or lesion Psych- euthymic mood, full affect Neuro- strength and sensation are intact  EKG- Vent. rate 78 BPM PR interval * ms QRS duration 84 ms QT/QTcB 430/490 ms P-R-T axes 86 55 62 Atrial flutter with variable A-V block Prolonged QT Abnormal ECG When compared with ECG of 21-Apr-2022 10:08, PREVIOUS ECG IS PRESENT    Assessment and Plan:  1. Afib/flutter  Tikosyn laod was not successful to keep pt in rhythm  He feels fatigued in afib/flutter We discussed stopping tikosyn and starting amio or consider ablation He will stop tikosyn but does not want to start amio at this point for  fear of aggravating his thyroid  He wants to be penciled in for ablation with Dr. Curt Bears  and will confirm with Jonni Sanger at his appointment  9/26 with him  Stay off BB for aggravating conversion pauses   2. CHA2DS2VASc  score of at least 4  Continue warfarin   3. HTN  Stable   F/u with Jonni Sanger 9/26  Allen [name]. Allen [name], [facility] [address] [place], [place] [address] [phone]

## 2022-04-27 NOTE — Telephone Encounter (Signed)
AFib ablation date scheduled for 12/19. Aware we will be in touch at later date to go over instructions. Patient verbalized understanding and agreeable to plan.

## 2022-05-02 ENCOUNTER — Ambulatory Visit: Payer: Medicare Other | Attending: Cardiology

## 2022-05-02 DIAGNOSIS — Z7901 Long term (current) use of anticoagulants: Secondary | ICD-10-CM | POA: Diagnosis not present

## 2022-05-02 DIAGNOSIS — I4891 Unspecified atrial fibrillation: Secondary | ICD-10-CM | POA: Diagnosis not present

## 2022-05-02 LAB — POCT INR: INR: 1.9 — AB (ref 2.0–3.0)

## 2022-05-02 NOTE — Patient Instructions (Signed)
Description   Take 1.5 tablets today and then START taking 1 tablet daily except 1.5 tablets on Sundays and Fridays. Stay consistent with greens  INR check in 3 weeks - [id number]

## 2022-05-08 ENCOUNTER — Other Ambulatory Visit: Payer: Self-pay | Admitting: Cardiology

## 2022-05-22 ENCOUNTER — Ambulatory Visit (INDEPENDENT_AMBULATORY_CARE_PROVIDER_SITE_OTHER): Payer: No Typology Code available for payment source

## 2022-05-22 DIAGNOSIS — I4891 Unspecified atrial fibrillation: Secondary | ICD-10-CM

## 2022-05-23 ENCOUNTER — Ambulatory Visit: Payer: Medicare Other | Attending: Cardiology

## 2022-05-23 DIAGNOSIS — Z7901 Long term (current) use of anticoagulants: Secondary | ICD-10-CM

## 2022-05-23 DIAGNOSIS — I4891 Unspecified atrial fibrillation: Secondary | ICD-10-CM | POA: Diagnosis not present

## 2022-05-23 LAB — POCT INR: INR: 2.4 (ref 2.0–3.0)

## 2022-05-23 NOTE — Patient Instructions (Signed)
Description   Continue taking 1 tablet daily except 1.5 tablets on Sundays and Fridays. Stay consistent with greens  INR check in 4 weeks  [facility] [phone]

## 2022-05-24 LAB — CUP PACEART REMOTE DEVICE CHECK
Date Time Interrogation Session: 20230918134223
Implantable Pulse Generator Implant Date: 20220906

## 2022-05-24 NOTE — Progress Notes (Signed)
Electrophysiology Office Note Date: 05/30/2022  ID:  Allen [name], DOB [birth year], MRN [id number]  PCP: Street, [name], MD Primary Cardiologist: None Electrophysiologist: Will Meredith Leeds, MD   CC: ILR follow-up  Allen [name] is a 81 y.o. male seen today for Dr. Curt Bears . he presents today for routine electrophysiology followup. Since last being seen in our clinic the patient reports doing well. Feels AF a couple of days a week. Burden ~35% by his monitor. Has noted more fatigue, but doing his best to remain active. He denies chest pain, dyspnea, PND, orthopnea, nausea, vomiting, dizziness, syncope, edema, weight gain, or early satiety. .  Device History: Medtronic loop recorder implanted 05/2021 for Atrial fibrillation  Past Medical History:  Diagnosis Date   AAA (abdominal aortic aneurysm) ([place])    Atrial fibrillation ([facility])    Cardiomyopathy, nonischemic ([place]) 10/28/2018   CHF (congestive heart failure) ([place])    CVA (cerebral infarction) 1969   Hypertension    Irregular heart beat    Malaise and fatigue 11/05/2019   Murmur 11/05/2019   Palpitations 11/05/2019   Prostatitis    PSVT (paroxysmal supraventricular tachycardia) ([place]) 05/22/2017   Formatting of this note might be different from the original. Prior ablation 2006   Thyroid disease    Hypothyrodidism   Varicose veins    Past Surgical History:  Procedure Laterality Date   ABDOMINAL AORTIC ANEURYSM REPAIR     ABLATION     CAROTID ENDARTERECTOMY  November 21, 2006   Left cea   ENDOVENOUS ABLATION SAPHENOUS VEIN W/ LASER Left 11-28-2012   left greater saphenous vein by Curt Jews MD   ENDOVENOUS ABLATION SAPHENOUS VEIN W/ LASER Right 12-26-2012   right gretaer saphenous vein by Curt Jews MD   INGUINAL HERNIA REPAIR     X's  4   INGUINAL HERNIA REPAIR  09-2011   left side   stab phlebectomy Left 03-13-2013   10-20 incisions by Curt Jews MD   TONSILLECTOMY AND ADENOIDECTOMY      Current Outpatient  Medications  Medication Sig Dispense Refill   acyclovir (ZOVIRAX) 800 MG tablet Take 400 mg by mouth 2 (two) times daily.     finasteride (PROSCAR) 5 MG tablet Take 1 tablet by mouth daily.     levothyroxine (SYNTHROID) 150 MCG tablet Take 150 mcg by mouth daily before breakfast.     psyllium (METAMUCIL) 58.6 % packet Take 1 packet by mouth 3 (three) times daily.     silodosin (RAPAFLO) 8 MG CAPS capsule Take 8 mg by mouth at bedtime.     simvastatin (ZOCOR) 20 MG tablet Take 10 mg by mouth at bedtime.     Sodium Hyaluronate, oral, (HYALURONIC ACID) 100 MG CAPS Take 100 mg by mouth at bedtime.     tadalafil (CIALIS) 5 MG tablet Take 5 mg by mouth daily.     Tamsulosin HCl (FLOMAX) 0.4 MG CAPS Take 0.4 mg by mouth in the morning. Reported on 01/04/2016     warfarin (COUMADIN) 4 MG tablet TAKE 1 TO 1.5 TABLETS BY MOUTH ONCE DAILY AS DIRECTED BY COUMADIN  CLINIC 120 tablet 0   No current facility-administered medications for this visit.    Allergies:   Bactrim [sulfamethoxazole-trimethoprim] and Digoxin   Social History: Social History   Socioeconomic History   Marital status: Divorced    Spouse name: Not on file   Number of children: Not on file   Years of education: Not on file   Highest  education level: Not on file  Occupational History   Not on file  Tobacco Use   Smoking status: Former    Types: Cigarettes    Quit date: 09/04/1993    Years since quitting: 28.7    Passive exposure: Past   Smokeless tobacco: Never  Vaping Use   Vaping Use: Never used  Substance and Sexual Activity   Alcohol use: Yes    Alcohol/week: 2.0 - 4.0 standard drinks of alcohol    Types: 2 - 4 Glasses of wine per week   Drug use: No   Sexual activity: Not on file  Other Topics Concern   Not on file  Social History Narrative   Not on file   Social Determinants of Health   Financial Resource Strain: Not on file  Food Insecurity: Not on file  Transportation Needs: Not on file  Physical Activity:  Not on file  Stress: Not on file  Social Connections: Not on file  Intimate Partner Violence: Not on file    Family History: Family History  Problem Relation Age of Onset   Stroke Father    Angina Mother    Hypertension Sister    Heart disease Sister      Review of Systems: All other systems reviewed and are otherwise negative except as noted above.  Physical Exam: Vitals:   05/30/22 1108  BP: 138/82  Pulse: 79  SpO2: 98%  Weight: 192 lb (87.1 kg)  Height: 6' (1.829 m)     GEN- The patient is well appearing, alert and oriented x 3 today.   HEENT: normocephalic, atraumatic; sclera clear, conjunctiva pink; hearing intact; oropharynx clear; neck supple  Lungs- Clear to ausculation bilaterally, normal work of breathing.  No wheezes, rales, rhonchi Heart- Irregularly irregular rate and rhythm, no murmurs, rubs or gallops  GI- soft, non-tender, non-distended, bowel sounds present  Extremities- no clubbing, cyanosis, or edema  MS- no significant deformity or atrophy Skin- warm and dry, no rash or lesion; ILR pocket well healed Psych- euthymic mood, full affect Neuro- strength and sensation are intact  PPM Interrogation- reviewed in detail today,  See PACEART report  EKG:  EKG is not ordered today. EKG ordered 04/27/2022 AFL at 78 bpm  Recent Labs: 06/27/2021: NT-Pro BNP 373 12/26/2021: ALT 12; Hemoglobin 13.4; Platelets 180; TSH 0.547 04/21/2022: BUN 17; Creatinine, Ser 1.14; Magnesium 1.9; Potassium 4.1; Sodium 133   Wt Readings from Last 3 Encounters:  05/30/22 192 lb (87.1 kg)  04/27/22 194 lb 12.8 oz (88.4 kg)  04/18/22 198 lb 3.1 oz (89.9 kg)     Other studies Reviewed: Additional studies/ records that were reviewed today include: Previous EP office notes, Previous remote checks, Most recent labwork.   Assessment and Plan:  1.Paroxysmal Atrial fibrillation s/p Medtronic Loop recorder Burden 35% by loop recorder Normal device function See Pace Art report No  changes today Continue coumadin He is pending ablation in December.   2. HTN Stable on current regimen    Current medicines are reviewed at length with the patient today.    Disposition:   Follow up with Dr. Curt Bears  as usual post ablation, unless re-visit for H&P is required prior to that procedure.   Jacalyn Lefevre, PA-C  05/30/2022 11:21 AM  [facility] [address] [place] [address] [phone] (office) [phone] (fax)

## 2022-05-30 ENCOUNTER — Ambulatory Visit: Payer: No Typology Code available for payment source | Attending: Student | Admitting: Student

## 2022-05-30 ENCOUNTER — Encounter: Payer: Self-pay | Admitting: Student

## 2022-05-30 VITALS — BP 138/82 | HR 79 | Ht 72.0 in | Wt 192.0 lb

## 2022-05-30 DIAGNOSIS — Z7901 Long term (current) use of anticoagulants: Secondary | ICD-10-CM

## 2022-05-30 DIAGNOSIS — I4891 Unspecified atrial fibrillation: Secondary | ICD-10-CM

## 2022-05-30 DIAGNOSIS — I10 Essential (primary) hypertension: Secondary | ICD-10-CM

## 2022-05-30 NOTE — Patient Instructions (Signed)
Medication Instructions:  Your physician recommends that you continue on your current medications as directed. Please refer to the Current Medication list given to you today.  *If you need a refill on your cardiac medications before your next appointment, please call your pharmacy*   Lab Work: None If you have labs (blood work) drawn today and your tests are completely normal, you will receive your results only by: MyChart Message (if you have MyChart) OR A paper copy in the mail If you have any lab test that is abnormal or we need to change your treatment, we will call you to review the results.   Follow-Up: At [facility], you and your health needs are our priority.  As part of our continuing mission to provide you with exceptional heart care, we have created designated Provider Care Teams.  These Care Teams include your primary Cardiologist (physician) and Advanced Practice Providers (APPs -  Physician Assistants and Nurse Practitioners) who all work together to provide you with the care you need, when you need it.   Your next appointment:   As scheduled 

## 2022-06-03 NOTE — Progress Notes (Signed)
Carelink Summary Report / Loop Recorder 

## 2022-06-05 ENCOUNTER — Encounter: Payer: Self-pay | Admitting: Cardiology

## 2022-06-19 ENCOUNTER — Encounter: Payer: Self-pay | Admitting: Cardiology

## 2022-06-19 LAB — CUP PACEART REMOTE DEVICE CHECK
Date Time Interrogation Session: 20231015230229
Implantable Pulse Generator Implant Date: 20220906

## 2022-06-20 ENCOUNTER — Ambulatory Visit: Payer: No Typology Code available for payment source | Attending: Cardiology

## 2022-06-20 DIAGNOSIS — Z7901 Long term (current) use of anticoagulants: Secondary | ICD-10-CM | POA: Diagnosis not present

## 2022-06-20 DIAGNOSIS — I4891 Unspecified atrial fibrillation: Secondary | ICD-10-CM | POA: Diagnosis not present

## 2022-06-20 LAB — POCT INR: INR: 3 (ref 2.0–3.0)

## 2022-06-20 NOTE — Patient Instructions (Signed)
Description   Continue taking 1 tablet daily except 1.5 tablets on Sundays and Fridays. Stay consistent with greens  INR check in 5 weeks (pt request)   [facility]in Clinic [phone]

## 2022-06-22 DIAGNOSIS — D6869 Other thrombophilia: Secondary | ICD-10-CM | POA: Diagnosis not present

## 2022-06-22 DIAGNOSIS — E039 Hypothyroidism, unspecified: Secondary | ICD-10-CM | POA: Diagnosis not present

## 2022-06-22 DIAGNOSIS — I480 Paroxysmal atrial fibrillation: Secondary | ICD-10-CM | POA: Diagnosis not present

## 2022-06-26 ENCOUNTER — Ambulatory Visit (INDEPENDENT_AMBULATORY_CARE_PROVIDER_SITE_OTHER): Payer: No Typology Code available for payment source

## 2022-06-26 DIAGNOSIS — I4891 Unspecified atrial fibrillation: Secondary | ICD-10-CM

## 2022-06-28 DIAGNOSIS — Z23 Encounter for immunization: Secondary | ICD-10-CM | POA: Diagnosis not present

## 2022-06-29 NOTE — Progress Notes (Signed)
Cardiology Office Note:    Date:  06/30/2022   ID:  Allen [name], DOB May 24, 1941, MRN [id number]  PCP:  Street, [name], MD  Cardiologist:  Shirlee More, MD    Referring MD: [address], [name], *    ASSESSMENT:    1. Paroxysmal atrial fibrillation (HCC)   2. Chronic anticoagulation   3. Hypertensive heart disease without heart failure   4. Mixed hyperlipidemia    PLAN:    In order of problems listed above:  Li has recurrent atrial arrhythmia documented symptomatic atypical atrial flutter with rapid response was recent loop recorder with 73 episodes the report does not detail any significant bradycardia and strongly encouraged him to follow through the EP catheter ablation currently anticoagulated we will continue warfarin and not an antiarrhythmic drug and I gave him a prescription for very low-dose beta-blocker he can take as needed for sustained rapid heart rhythm. Continue his current antihypertensive medications mildly elevated in the office today he will trend and bring with the next visit to my office Continue his statin   Next appointment: 9 months   Medication Adjustments/Labs and Tests Ordered: Current medicines are reviewed at length with the patient today.  Concerns regarding medicines are outlined above.  No orders of the defined types were placed in this encounter.  Meds ordered this encounter  Medications   metoprolol tartrate (LOPRESSOR) 25 MG tablet    Sig: Take 0.5 tablets (12.5 mg total) by mouth daily as needed (Heartrate Higher than 130 lasting 1 hour or more).    Dispense:  45 tablet    Refill:  3    Chief Complaint  Patient presents with   Follow-up    History of Present Illness:    Allen [name] is a 81 y.o. male with a hx of nonischemic cardiomyopathy with normalization of his ejection fraction paroxysmal atrial fibrillation with previous EP catheter ablation previous stroke with long-term anticoagulation hypertensive heart  disease peripheral arterial disease with EVAR for abdominal aortic aneurysm and hypothyroidism.  He was last seen 12/26/2021.  He is scheduled for atrial fibrillation ablation 08/22/2022  Compliance with diet, lifestyle and medications: Yes  He asked me my opinion about having intervention for his atrial arrhythmia I have encouraged him to have EP catheter ablation as he developed cardiomyopathy decades ago in the setting of atrial fibrillation.  He has ongoing disability issues with the [employer] and asked me if I think that he had a stroke decades ago when he had atrial fibrillation with some cognitive dysfunction and I told him I cannot say with any certainty that the 2 are related He has episode where his heart races up to 160 bpm however it resolves spontaneously. I have offered him a beta-blocker to take as needed and he will use his judgment only take if the rate is excessive and lasts greater than an hour Although he has sinus pauses he is not having syncope No edema shortness of breath or chest pain Past Medical History:  Diagnosis Date   AAA (abdominal aortic aneurysm) (HCC)    Atrial fibrillation ([place])    Cardiomyopathy, nonischemic ([place]) 10/28/2018   CHF (congestive heart failure) ([place])    CVA (cerebral infarction) 1969   Hypertension    Irregular heart beat    Malaise and fatigue 11/05/2019   Murmur 11/05/2019   Palpitations 11/05/2019   Prostatitis    PSVT (paroxysmal supraventricular tachycardia) 05/22/2017   Formatting of this note might be different from the original.  Prior ablation 2006   Thyroid disease    Hypothyrodidism   Varicose veins     Past Surgical History:  Procedure Laterality Date   ABDOMINAL AORTIC ANEURYSM REPAIR     ABLATION     CAROTID ENDARTERECTOMY  November 21, 2006   Left cea   ENDOVENOUS ABLATION SAPHENOUS VEIN W/ LASER Left 11-28-2012   left greater saphenous vein by Curt Jews MD   ENDOVENOUS ABLATION SAPHENOUS VEIN W/ LASER Right 12-26-2012    right gretaer saphenous vein by Curt Jews MD   INGUINAL HERNIA REPAIR     X's  4   INGUINAL HERNIA REPAIR  09-2011   left side   stab phlebectomy Left 03-13-2013   10-20 incisions by Curt Jews MD   TONSILLECTOMY AND ADENOIDECTOMY      Current Medications: Current Meds  Medication Sig   acyclovir (ZOVIRAX) 800 MG tablet Take 400 mg by mouth 2 (two) times daily.   finasteride (PROSCAR) 5 MG tablet Take 1 tablet by mouth daily.   levothyroxine (SYNTHROID) 150 MCG tablet Take 150 mcg by mouth daily before breakfast.   metoprolol tartrate (LOPRESSOR) 25 MG tablet Take 0.5 tablets (12.5 mg total) by mouth daily as needed (Heartrate Higher than 130 lasting 1 hour or more).   psyllium (METAMUCIL) 58.6 % packet Take 1 packet by mouth 3 (three) times daily.   silodosin (RAPAFLO) 8 MG CAPS capsule Take 8 mg by mouth at bedtime.   simvastatin (ZOCOR) 20 MG tablet Take 10 mg by mouth at bedtime.   Sodium Hyaluronate, oral, (HYALURONIC ACID) 100 MG CAPS Take 100 mg by mouth at bedtime.   tadalafil (CIALIS) 5 MG tablet Take 5 mg by mouth daily.   Tamsulosin HCl (FLOMAX) 0.4 MG CAPS Take 0.4 mg by mouth in the morning. Reported on 01/04/2016   warfarin (COUMADIN) 4 MG tablet TAKE 1 TO 1.5 TABLETS BY MOUTH ONCE DAILY AS DIRECTED BY COUMADIN  CLINIC     Allergies:   Bactrim [sulfamethoxazole-trimethoprim] and Digoxin   Social History   Socioeconomic History   Marital status: Divorced    Spouse name: Not on file   Number of children: Not on file   Years of education: Not on file   Highest education level: Not on file  Occupational History   Not on file  Tobacco Use   Smoking status: Former    Types: Cigarettes    Quit date: 09/04/1993    Years since quitting: 28.8    Passive exposure: Past   Smokeless tobacco: Never  Vaping Use   Vaping Use: Never used  Substance and Sexual Activity   Alcohol use: Yes    Alcohol/week: 2.0 - 4.0 standard drinks of alcohol    Types: 2 - 4 Glasses of  wine per week   Drug use: No   Sexual activity: Not on file  Other Topics Concern   Not on file  Social History Narrative   Not on file   Social Determinants of Health   Financial Resource Strain: Not on file  Food Insecurity: Not on file  Transportation Needs: Not on file  Physical Activity: Not on file  Stress: Not on file  Social Connections: Not on file     Family History: The patient's family history includes Angina in his mother; Heart disease in his sister; Hypertension in his sister; Stroke in his father. ROS:   Please see the history of present illness.    All other systems reviewed and are negative.  EKGs/Labs/Other Studies Reviewed:    The following studies were reviewed today: An echocardiogram performed 07/07/2021: He had mild concentric LVH normal left ventricular volume normal left ventricular systolic function EF 60 to 65% and grade 2 diastolic dysfunction.  Right ventricle is normal in size function and pulmonary artery pressure.  Mild mitral regurgitation was seen.  1. GLS -15.6. Left ventricular ejection fraction, by estimation, is 60 to  65%. The left ventricle has normal function. The left ventricle has no  regional wall motion abnormalities. The left ventricular internal cavity  size was mildly dilated. There is  mild left ventricular hypertrophy. Left ventricular diastolic parameters  are consistent with Grade II diastolic dysfunction (pseudonormalization).   2. Right ventricular systolic function is normal. The right ventricular  size is normal. There is normal pulmonary artery systolic pressure.   3. The mitral valve is normal in structure. Mild mitral valve  regurgitation. No evidence of mitral stenosis.   4. The aortic valve is normal in structure. Aortic valve regurgitation is  mild. No aortic stenosis is present.   5. There is mild dilatation of the ascending aorta, measuring 39 mm.   6. The inferior vena cava is normal in size with greater than  50%  respiratory variability, suggesting right atrial pressure of 3 mmHg.  EKG:  EKG ordered 04/27/2022 by EP showed atypical atrial flutter controlled ventricular rate  Recent Labs: 12/26/2021: ALT 12; Hemoglobin 13.4; Platelets 180; TSH 0.547 04/21/2022: BUN 17; Creatinine, Ser 1.14; Magnesium 1.9; Potassium 4.1; Sodium 133  Recent Lipid Panel    Component Value Date/Time   CHOL 107 12/26/2021 0859   TRIG 64 12/26/2021 0859   HDL 43 12/26/2021 0859   CHOLHDL 2.5 12/26/2021 0859   LDLCALC 50 12/26/2021 0859    Physical Exam:    VS:  BP (!) 150/78 (BP Location: Left Arm, Patient Position: Sitting, Cuff Size: Normal)   Pulse (!) 54   Ht 6' (1.829 m)   Wt 196 lb (88.9 kg)   SpO2 99%   BMI 26.58 kg/m     Wt Readings from Last 3 Encounters:  06/30/22 196 lb (88.9 kg)  05/30/22 192 lb (87.1 kg)  04/27/22 194 lb 12.8 oz (88.4 kg)     GEN:  Well nourished, well developed in no acute distress HEENT: Normal NECK: No JVD; No carotid bruits LYMPHATICS: No lymphadenopathy CARDIAC: RRR, no murmurs, rubs, gallops RESPIRATORY:  Clear to auscultation without rales, wheezing or rhonchi  ABDOMEN: Soft, non-tender, non-distended MUSCULOSKELETAL:  No edema; No deformity  SKIN: Warm and dry NEUROLOGIC:  Alert and oriented x 3 PSYCHIATRIC:  Normal affect    Signed, Shirlee More, MD  06/30/2022 9:57 AM    [place]

## 2022-06-30 ENCOUNTER — Encounter: Payer: Self-pay | Admitting: Cardiology

## 2022-06-30 ENCOUNTER — Ambulatory Visit: Payer: No Typology Code available for payment source | Attending: Cardiology | Admitting: Cardiology

## 2022-06-30 VITALS — BP 150/78 | HR 54 | Ht 72.0 in | Wt 196.0 lb

## 2022-06-30 DIAGNOSIS — Z7901 Long term (current) use of anticoagulants: Secondary | ICD-10-CM | POA: Diagnosis not present

## 2022-06-30 DIAGNOSIS — I119 Hypertensive heart disease without heart failure: Secondary | ICD-10-CM

## 2022-06-30 DIAGNOSIS — E782 Mixed hyperlipidemia: Secondary | ICD-10-CM | POA: Diagnosis not present

## 2022-06-30 DIAGNOSIS — I480 Paroxysmal atrial fibrillation: Secondary | ICD-10-CM | POA: Diagnosis not present

## 2022-06-30 MED ORDER — METOPROLOL TARTRATE 25 MG PO TABS: 12.5000 mg | ORAL_TABLET | Freq: Every day | ORAL | 3 refills | Status: DC | PRN

## 2022-06-30 NOTE — Patient Instructions (Signed)
Medication Instructions:  Your physician has recommended you make the following change in your medication:  Take Metoprolol 12.5 mg (1/2 of 25 mg tablet) for heart rate greater than 130 lasting 1 hour or longer  *If you need a refill on your cardiac medications before your next appointment, please call your pharmacy*   Lab Work: NONE If you have labs (blood work) drawn today and your tests are completely normal, you will receive your results only by: [facility] (if you have MyChart) OR A paper copy in the mail If you have any lab test that is abnormal or we need to change your treatment, we will call you to review the results.   Testing/Procedures: NONE   Follow-Up: At [facility], you and your health needs are our priority.  As part of our continuing mission to provide you with exceptional heart care, we have created designated Provider Care Teams.  These Care Teams include your primary Cardiologist (physician) and Advanced Practice Providers (APPs -  Physician Assistants and Nurse Practitioners) who all work together to provide you with the care you need, when you need it.  We recommend signing up for the patient portal called "MyChart".  Sign up information is provided on this After Visit Summary.  MyChart is used to connect with patients for Virtual Visits (Telemedicine).  Patients are able to view lab/test results, encounter notes, upcoming appointments, etc.  Non-urgent messages can be sent to your provider as well.   To learn more about what you can do with MyChart, go to [email or web address].    Your next appointment:   9 month(s)  The format for your next appointment:   In Person  Provider:   Shirlee More, MD    Other Instructions   Important Information About [place]

## 2022-07-18 NOTE — Progress Notes (Signed)
Carelink Summary Report / Loop Recorder 

## 2022-07-19 ENCOUNTER — Telehealth: Payer: Self-pay

## 2022-07-19 DIAGNOSIS — I480 Paroxysmal atrial fibrillation: Secondary | ICD-10-CM

## 2022-07-19 NOTE — Telephone Encounter (Signed)
I called pt to discuss upcoming procedure. LM for him to return my call.

## 2022-07-19 NOTE — Telephone Encounter (Signed)
Received message below:  Allen [name], Allen [name], CMA  P Cv Div Ash Anticoag; P Cv Div Nl Anticoag This pt has an upcoming Afib Ablation with Dr. Curt Bears on 08/22/22 and will need 3 therapeutic weekly INR's prior to the procedure. Can your team please arrange this for the pt and let me know. I have LM for the pt to call me to go over all of his instructions so when I talk to him I will let him know about this as well.   Thanks  Allen [name]   Pt has scheduled appt with [facility] on 11/21 to have INR checked and will start weekly checks at this appt. Called pt to make him aware and remind him of upcoming appt, no answer. Left message on voicemail.

## 2022-07-20 ENCOUNTER — Telehealth: Payer: Self-pay

## 2022-07-20 NOTE — Telephone Encounter (Signed)
I called pt to go over procedure Instructions. He is aware of dates/times of upcoming appointments.  Letters were sent via [facility] and will be printed out for him at his lab appt in [place] on 11/21 with Abigail.

## 2022-07-25 ENCOUNTER — Ambulatory Visit: Payer: No Typology Code available for payment source | Attending: Cardiology

## 2022-07-25 DIAGNOSIS — I4891 Unspecified atrial fibrillation: Secondary | ICD-10-CM

## 2022-07-25 DIAGNOSIS — Z7901 Long term (current) use of anticoagulants: Secondary | ICD-10-CM

## 2022-07-25 LAB — BASIC METABOLIC PANEL
BUN/Creatinine Ratio: 13 (ref 10–24)
BUN: 13 mg/dL (ref 8–27)
CO2: 26 mmol/L (ref 20–29)
Calcium: 8.7 mg/dL (ref 8.6–10.2)
Chloride: 103 mmol/L (ref 96–106)
Creatinine, Ser: 1.01 mg/dL (ref 0.76–1.27)
Glucose: 98 mg/dL (ref 70–99)
Potassium: 4.6 mmol/L (ref 3.5–5.2)
Sodium: 138 mmol/L (ref 134–144)
eGFR: 75 mL/min/{1.73_m2} (ref >59)

## 2022-07-25 LAB — CBC
Hematocrit: 39.7 % (ref 37.5–51.0)
Hemoglobin: 13.1 g/dL (ref 13.0–17.7)
MCH: 31 pg (ref 26.6–33.0)
MCHC: 33 g/dL (ref 31.5–35.7)
MCV: 94 fL (ref 79–97)
Platelets: 166 10*3/uL (ref 150–450)
RBC: 4.23 x10E6/uL (ref 4.14–5.80)
RDW: 11.9 % (ref 11.6–15.4)
WBC: 3.8 10*3/uL (ref 3.4–10.8)

## 2022-07-25 LAB — POCT INR: INR: 2.6 (ref 2.0–3.0)

## 2022-07-25 NOTE — Patient Instructions (Signed)
Description   Continue taking 1 tablet daily except 1.5 tablets on Sundays and Fridays. Stay consistent with greens  INR check in 1 week (weekly checks for ablation)    [facility] [phone]

## 2022-07-31 ENCOUNTER — Ambulatory Visit (INDEPENDENT_AMBULATORY_CARE_PROVIDER_SITE_OTHER): Payer: No Typology Code available for payment source

## 2022-07-31 DIAGNOSIS — I4891 Unspecified atrial fibrillation: Secondary | ICD-10-CM | POA: Diagnosis not present

## 2022-07-31 LAB — CUP PACEART REMOTE DEVICE CHECK
Date Time Interrogation Session: 20231126231012
Implantable Pulse Generator Implant Date: 20220906

## 2022-08-01 ENCOUNTER — Ambulatory Visit: Payer: No Typology Code available for payment source | Attending: Cardiology

## 2022-08-01 DIAGNOSIS — I4891 Unspecified atrial fibrillation: Secondary | ICD-10-CM

## 2022-08-01 DIAGNOSIS — Z7901 Long term (current) use of anticoagulants: Secondary | ICD-10-CM | POA: Diagnosis not present

## 2022-08-01 LAB — POCT INR: INR: 2.5 (ref 2.0–3.0)

## 2022-08-01 NOTE — Patient Instructions (Signed)
Description   Continue taking 1 tablet daily except 1.5 tablets on Sundays and Fridays. Stay consistent with greens  INR check in 1 week (weekly checks for ablation)    [facility] [phone]

## 2022-08-08 ENCOUNTER — Ambulatory Visit: Payer: No Typology Code available for payment source | Attending: Cardiology

## 2022-08-08 DIAGNOSIS — I4891 Unspecified atrial fibrillation: Secondary | ICD-10-CM | POA: Diagnosis not present

## 2022-08-08 DIAGNOSIS — Z7901 Long term (current) use of anticoagulants: Secondary | ICD-10-CM

## 2022-08-08 LAB — POCT INR: INR: 2.2 (ref 2.0–3.0)

## 2022-08-08 NOTE — Patient Instructions (Signed)
Description   Take an extra 1/2 tablet today and then continue taking 1 tablet daily except 1.5 tablets on Sundays and Fridays. Stay consistent with greens  INR check in 1 week (weekly checks for ablation)    [facility] [phone]

## 2022-08-14 ENCOUNTER — Telehealth (HOSPITAL_COMMUNITY): Payer: Self-pay | Admitting: Emergency Medicine

## 2022-08-14 NOTE — Telephone Encounter (Signed)
Reaching out to patient to offer assistance regarding upcoming cardiac imaging study; pt verbalizes understanding of appt date/time, parking situation and where to check in, pre-test NPO status and medications ordered, and verified current allergies; name and call back number provided for further questions should they arise Marchia Bond RN Navigator Cardiac Imaging [facility] [phone] office [phone] cell  Arrival  100 Denies Iv issues Daily meds

## 2022-08-15 ENCOUNTER — Ambulatory Visit (INDEPENDENT_AMBULATORY_CARE_PROVIDER_SITE_OTHER): Payer: No Typology Code available for payment source

## 2022-08-15 ENCOUNTER — Ambulatory Visit (HOSPITAL_COMMUNITY)
Admission: RE | Admit: 2022-08-15 | Discharge: 2022-08-15 | Disposition: A | Discharge status: Home / Self Care | Payer: No Typology Code available for payment source | Source: Ambulatory Visit | Attending: Cardiology | Admitting: Cardiology

## 2022-08-15 DIAGNOSIS — Z7901 Long term (current) use of anticoagulants: Secondary | ICD-10-CM

## 2022-08-15 DIAGNOSIS — I4891 Unspecified atrial fibrillation: Secondary | ICD-10-CM

## 2022-08-15 DIAGNOSIS — I480 Paroxysmal atrial fibrillation: Secondary | ICD-10-CM | POA: Insufficient documentation

## 2022-08-15 LAB — POCT INR: INR: 2.1 (ref 2.0–3.0)

## 2022-08-15 MED ORDER — IOHEXOL 350 MG/ML SOLN
100.0000 mL | Freq: Once | INTRAVENOUS | Status: AC | PRN
  Administered 2022-08-15: 100 mL via INTRAVENOUS

## 2022-08-15 NOTE — Patient Instructions (Signed)
Description   Take an extra 1/2 tablet today and then continue taking 1 tablet daily except 1.5 tablets on Sundays and Fridays. (ON SUNDAY, 12/17 TAKE AND EXTRA 1/2 TABLET)  Stay consistent with greens  INR check in 1 week   [facility] [phone]

## 2022-08-18 ENCOUNTER — Telehealth: Payer: Self-pay

## 2022-08-18 NOTE — Telephone Encounter (Signed)
LM for pt to call back to update him on the time change of his procedure. He will need to arrive at 7:30am for a 9:30am procedure time.

## 2022-08-18 NOTE — Telephone Encounter (Signed)
Pt is aware of time change of procedure. He will arrive at 7:30am.

## 2022-08-20 ENCOUNTER — Other Ambulatory Visit: Payer: Self-pay | Admitting: Cardiology

## 2022-08-21 ENCOUNTER — Other Ambulatory Visit: Payer: Self-pay

## 2022-08-21 ENCOUNTER — Telehealth: Payer: Self-pay | Admitting: Cardiology

## 2022-08-21 NOTE — Pre-Procedure Instructions (Signed)
Instructed patient on the following items: Arrival time 0730 Nothing to eat or drink after midnight No meds AM of procedure Responsible person to drive you home and stay with you for 24 hrs  Have you missed any doses of anti-coagulant Coumadin- hasn't missed any doses

## 2022-08-21 NOTE — Telephone Encounter (Signed)
Pt would like a callback regarding when he needs to reschedule Coumadin appt being that he has an Ablation scheduled for tomorrow. Please advise

## 2022-08-21 NOTE — Telephone Encounter (Signed)
Returned pt's call. Pt stated his ablation time has been changed to 7:30am tomorrow and he will no longer be able to have INR checked at [facility]. Pt stated he let scheduler know that his INR would not be able to be checked the day of the procedure a coumadin clinic if time was changed to 7:30am. Pt was told that his INR could be checked when he arrives for procedure. Rescheduled coumadin clinic appt for post procedure on 09/05/22. Pt verbalized understanding.

## 2022-08-22 ENCOUNTER — Ambulatory Visit (HOSPITAL_COMMUNITY)
Admission: RE | Admit: 2022-08-22 | Discharge: 2022-08-23 | Disposition: A | Discharge status: Home / Self Care | Payer: No Typology Code available for payment source | Source: Ambulatory Visit | Attending: Cardiology | Admitting: Cardiology

## 2022-08-22 ENCOUNTER — Other Ambulatory Visit: Payer: Self-pay

## 2022-08-22 ENCOUNTER — Encounter (HOSPITAL_COMMUNITY)
Admission: RE | Disposition: A | Discharge status: Home / Self Care | Payer: Self-pay | Source: Ambulatory Visit | Attending: Cardiology

## 2022-08-22 ENCOUNTER — Ambulatory Visit (HOSPITAL_COMMUNITY): Payer: No Typology Code available for payment source | Admitting: Anesthesiology

## 2022-08-22 ENCOUNTER — Ambulatory Visit (HOSPITAL_BASED_OUTPATIENT_CLINIC_OR_DEPARTMENT_OTHER): Payer: No Typology Code available for payment source | Admitting: Anesthesiology

## 2022-08-22 ENCOUNTER — Ambulatory Visit: Payer: No Typology Code available for payment source

## 2022-08-22 DIAGNOSIS — Z8249 Family history of ischemic heart disease and other diseases of the circulatory system: Secondary | ICD-10-CM | POA: Insufficient documentation

## 2022-08-22 DIAGNOSIS — Z8673 Personal history of transient ischemic attack (TIA), and cerebral infarction without residual deficits: Secondary | ICD-10-CM | POA: Insufficient documentation

## 2022-08-22 DIAGNOSIS — E039 Hypothyroidism, unspecified: Secondary | ICD-10-CM

## 2022-08-22 DIAGNOSIS — I4891 Unspecified atrial fibrillation: Secondary | ICD-10-CM | POA: Diagnosis not present

## 2022-08-22 DIAGNOSIS — I10 Essential (primary) hypertension: Secondary | ICD-10-CM | POA: Diagnosis not present

## 2022-08-22 DIAGNOSIS — I110 Hypertensive heart disease with heart failure: Secondary | ICD-10-CM | POA: Insufficient documentation

## 2022-08-22 DIAGNOSIS — I714 Abdominal aortic aneurysm, without rupture, unspecified: Secondary | ICD-10-CM | POA: Insufficient documentation

## 2022-08-22 DIAGNOSIS — Z823 Family history of stroke: Secondary | ICD-10-CM | POA: Insufficient documentation

## 2022-08-22 DIAGNOSIS — I509 Heart failure, unspecified: Secondary | ICD-10-CM | POA: Diagnosis not present

## 2022-08-22 DIAGNOSIS — I480 Paroxysmal atrial fibrillation: Secondary | ICD-10-CM | POA: Diagnosis not present

## 2022-08-22 DIAGNOSIS — Z87891 Personal history of nicotine dependence: Secondary | ICD-10-CM | POA: Insufficient documentation

## 2022-08-22 DIAGNOSIS — I483 Typical atrial flutter: Secondary | ICD-10-CM | POA: Diagnosis not present

## 2022-08-22 DIAGNOSIS — Z7901 Long term (current) use of anticoagulants: Secondary | ICD-10-CM

## 2022-08-22 HISTORY — PX: ATRIAL FIBRILLATION ABLATION: EP1191

## 2022-08-22 LAB — PROTIME-INR
INR: 1.9 — ABNORMAL HIGH (ref 0.8–1.2)
Prothrombin Time: 21.8 seconds — ABNORMAL HIGH (ref 11.4–15.2)

## 2022-08-22 LAB — POCT ACTIVATED CLOTTING TIME
Activated Clotting Time: 303 seconds
Activated Clotting Time: 320 seconds
Activated Clotting Time: 347 seconds

## 2022-08-22 SURGERY — ATRIAL FIBRILLATION ABLATION
Anesthesia: General

## 2022-08-22 MED ORDER — HYALURONIC ACID 100 MG PO CAPS: 100.0000 mg | ORAL_CAPSULE | ORAL | Status: DC

## 2022-08-22 MED ORDER — SODIUM CHLORIDE 0.9 % IV SOLN
INTRAVENOUS | Status: DC
Start: 2022-08-22 — End: 2022-08-22

## 2022-08-22 MED ORDER — HEPARIN SODIUM (PORCINE) 1000 UNIT/ML IJ SOLN
INTRAMUSCULAR | Status: DC | PRN
  Administered 2022-08-22: 1000 [IU] via INTRAVENOUS

## 2022-08-22 MED ORDER — HEPARIN (PORCINE) IN NACL 1000-0.9 UT/500ML-% IV SOLN
INTRAVENOUS | Status: DC | PRN
  Administered 2022-08-22 (×2): 500 mL

## 2022-08-22 MED ORDER — METOPROLOL TARTRATE 12.5 MG HALF TABLET: 12.5000 mg | ORAL_TABLET | Freq: Every day | ORAL | Status: DC | PRN

## 2022-08-22 MED ORDER — LEVOTHYROXINE SODIUM 75 MCG PO TABS
150.0000 ug | ORAL_TABLET | Freq: Every day | ORAL | Status: DC
  Administered 2022-08-23: 150 ug via ORAL
  Filled 2022-08-22: qty 2

## 2022-08-22 MED ORDER — DOBUTAMINE INFUSION FOR EP/ECHO/NUC (1000 MCG/ML)
INTRAVENOUS | Status: DC | PRN
  Administered 2022-08-22: 20 ug/kg/min via INTRAVENOUS

## 2022-08-22 MED ORDER — PHENYLEPHRINE HCL-NACL 20-0.9 MG/250ML-% IV SOLN
INTRAVENOUS | Status: DC | PRN
  Administered 2022-08-22: 40 ug/min via INTRAVENOUS

## 2022-08-22 MED ORDER — FENTANYL CITRATE (PF) 250 MCG/5ML IJ SOLN
INTRAMUSCULAR | Status: DC | PRN
  Administered 2022-08-22: 100 ug via INTRAVENOUS

## 2022-08-22 MED ORDER — ACETAMINOPHEN 325 MG PO TABS: 650.0000 mg | ORAL_TABLET | ORAL | Status: DC | PRN

## 2022-08-22 MED ORDER — ACYCLOVIR 400 MG PO TABS
400.0000 mg | ORAL_TABLET | Freq: Two times a day (BID) | ORAL | Status: DC
  Administered 2022-08-22 – 2022-08-23 (×2): 400 mg via ORAL
  Filled 2022-08-22 (×2): qty 1

## 2022-08-22 MED ORDER — ACETAMINOPHEN 500 MG PO TABS
1000.0000 mg | ORAL_TABLET | Freq: Once | ORAL | Status: AC
  Administered 2022-08-22: 1000 mg via ORAL
  Filled 2022-08-22: qty 2

## 2022-08-22 MED ORDER — SUGAMMADEX SODIUM 200 MG/2ML IV SOLN
INTRAVENOUS | Status: DC | PRN
  Administered 2022-08-22: 172.4 mg via INTRAVENOUS

## 2022-08-22 MED ORDER — HEPARIN SODIUM (PORCINE) 1000 UNIT/ML IJ SOLN
INTRAMUSCULAR | Status: DC | PRN
  Administered 2022-08-22: 1000 [IU] via INTRAVENOUS
  Administered 2022-08-22: 3000 [IU] via INTRAVENOUS
  Administered 2022-08-22: 14000 [IU] via INTRAVENOUS
  Administered 2022-08-22: 2000 [IU] via INTRAVENOUS

## 2022-08-22 MED ORDER — TADALAFIL 5 MG PO TABS
5.0000 mg | ORAL_TABLET | Freq: Every day | ORAL | Status: DC
  Administered 2022-08-22 – 2022-08-23 (×2): 5 mg via ORAL
  Filled 2022-08-22 (×2): qty 1

## 2022-08-22 MED ORDER — SIMVASTATIN 20 MG PO TABS
10.0000 mg | ORAL_TABLET | Freq: Every day | ORAL | Status: DC
  Administered 2022-08-22: 10 mg via ORAL
  Filled 2022-08-22: qty 1

## 2022-08-22 MED ORDER — SODIUM CHLORIDE 0.9% FLUSH: 3.0000 mL | INTRAVENOUS | Status: DC | PRN

## 2022-08-22 MED ORDER — EPHEDRINE SULFATE-NACL 50-0.9 MG/10ML-% IV SOSY
PREFILLED_SYRINGE | INTRAVENOUS | Status: DC | PRN
  Administered 2022-08-22 (×2): 2.5 mg via INTRAVENOUS

## 2022-08-22 MED ORDER — LIDOCAINE 2% (20 MG/ML) 5 ML SYRINGE
INTRAMUSCULAR | Status: DC | PRN
  Administered 2022-08-22: 60 mg via INTRAVENOUS

## 2022-08-22 MED ORDER — WARFARIN - PHARMACIST DOSING INPATIENT: Freq: Every day | Status: DC

## 2022-08-22 MED ORDER — WARFARIN SODIUM 2 MG PO TABS
4.0000 mg | ORAL_TABLET | Freq: Once | ORAL | Status: AC
  Administered 2022-08-22: 4 mg via ORAL
  Filled 2022-08-22: qty 2

## 2022-08-22 MED ORDER — HEPARIN SODIUM (PORCINE) 1000 UNIT/ML IJ SOLN
INTRAMUSCULAR | Status: AC
  Filled 2022-08-22: qty 10

## 2022-08-22 MED ORDER — ONDANSETRON HCL 4 MG/2ML IJ SOLN: 4.0000 mg | Freq: Four times a day (QID) | INTRAMUSCULAR | Status: DC | PRN

## 2022-08-22 MED ORDER — ONDANSETRON HCL 4 MG/2ML IJ SOLN
INTRAMUSCULAR | Status: DC | PRN
  Administered 2022-08-22: 4 mg via INTRAVENOUS

## 2022-08-22 MED ORDER — PROPOFOL 10 MG/ML IV BOLUS
INTRAVENOUS | Status: DC | PRN
  Administered 2022-08-22: 200 mg via INTRAVENOUS

## 2022-08-22 MED ORDER — PROTAMINE SULFATE 10 MG/ML IV SOLN
INTRAVENOUS | Status: DC | PRN
  Administered 2022-08-22: 40 mg via INTRAVENOUS

## 2022-08-22 MED ORDER — DEXAMETHASONE SODIUM PHOSPHATE 10 MG/ML IJ SOLN
INTRAMUSCULAR | Status: DC | PRN
  Administered 2022-08-22: 10 mg via INTRAVENOUS

## 2022-08-22 MED ORDER — ROCURONIUM BROMIDE 10 MG/ML (PF) SYRINGE
PREFILLED_SYRINGE | INTRAVENOUS | Status: DC | PRN
  Administered 2022-08-22: 50 mg via INTRAVENOUS

## 2022-08-22 MED ORDER — SODIUM CHLORIDE 0.9% FLUSH
3.0000 mL | Freq: Two times a day (BID) | INTRAVENOUS | Status: DC
  Administered 2022-08-22 – 2022-08-23 (×2): 3 mL via INTRAVENOUS

## 2022-08-22 MED ORDER — FINASTERIDE 5 MG PO TABS
5.0000 mg | ORAL_TABLET | Freq: Every day | ORAL | Status: DC
  Administered 2022-08-22 – 2022-08-23 (×2): 5 mg via ORAL
  Filled 2022-08-22 (×2): qty 1

## 2022-08-22 MED ORDER — DOBUTAMINE INFUSION FOR EP/ECHO/NUC (1000 MCG/ML)
INTRAVENOUS | Status: AC
  Filled 2022-08-22: qty 250

## 2022-08-22 MED ORDER — SODIUM CHLORIDE 0.9 % IV SOLN: 250.0000 mL | INTRAVENOUS | Status: DC | PRN

## 2022-08-22 SURGICAL SUPPLY — 19 items
BAG SNAP BAND KOVER 36X36 (MISCELLANEOUS) IMPLANT
CATH ABLAT QDOT MICRO BI TC FJ (CATHETERS) IMPLANT
CATH OCTARAY 2.0 F 3-3-3-3-3 (CATHETERS) IMPLANT
CATH PIGTAIL STEERABLE D1 8.7 (WIRE) IMPLANT
CATH S-M CIRCA TEMP PROBE (CATHETERS) IMPLANT
CATH SOUNDSTAR ECO 8FR (CATHETERS) IMPLANT
CATH WEBSTER BI DIR CS D-F CRV (CATHETERS) IMPLANT
CLOSURE PERCLOSE PROSTYLE (VASCULAR PRODUCTS) IMPLANT
COVER SWIFTLINK CONNECTOR (BAG) ×2 IMPLANT
PACK EP LATEX FREE (CUSTOM PROCEDURE TRAY) ×1
PACK EP LF (CUSTOM PROCEDURE TRAY) ×2 IMPLANT
PAD DEFIB RADIO PHYSIO CONN (PAD) ×2 IMPLANT
PATCH CARTO3 (PAD) IMPLANT
SHEATH CARTO VIZIGO SM CVD (SHEATH) IMPLANT
SHEATH PINNACLE 7F 10CM (SHEATH) IMPLANT
SHEATH PINNACLE 8F 10CM (SHEATH) IMPLANT
SHEATH PINNACLE 9F 10CM (SHEATH) IMPLANT
SHEATH PROBE COVER 6X72 (BAG) IMPLANT
TUBING SMART ABLATE COOLFLOW (TUBING) IMPLANT

## 2022-08-22 NOTE — Transfer of Care (Signed)
Immediate Anesthesia Transfer of Care Note  Patient: Allen [name]  Procedure(s) Performed: ATRIAL FIBRILLATION ABLATION  Patient Location: PACU  Anesthesia Type:General  Level of Consciousness: awake and alert   Airway & Oxygen Therapy: Patient Spontanous Breathing and Patient connected to nasal cannula oxygen  Post-op Assessment: Report given to RN and Post -op Vital signs reviewed and stable  Post vital signs: Reviewed and stable  Last Vitals:  Vitals Value Taken Time  BP 119/67 08/22/22 1318  Temp    Pulse 61 08/22/22 1322  Resp 12 08/22/22 1322  SpO2 93 % 08/22/22 1322  Vitals shown include unvalidated device data.  Last Pain:  Vitals:   08/22/22 0754  TempSrc: Temporal         Complications: There were no known notable events for this encounter.

## 2022-08-22 NOTE — Discharge Instructions (Signed)
Post procedure care instructions No driving for 4 days. No lifting over 5 lbs for 1 week. No vigorous or sexual activity for 1 week. You may return to work/your usual activities on 08/30/22. Keep procedure site clean & dry. If you notice increased pain, swelling, bleeding or pus, call/return!  You may shower after 24 hours, but no soaking in baths/hot tubs/pools for 1 week.    You have an appointment set up with the [facility].  Multiple studies have shown that being followed by a dedicated atrial fibrillation clinic in addition to the standard care you receive from your other physicians improves health. We believe that enrollment in the atrial fibrillation clinic will allow [place] to better care for you.   The phone number to the [facility] is [phone]. The clinic is staffed Monday through Friday from 8:30am to 5pm.  Directions: The clinic is located in the [facility], [place] the hospital at the MAIN ENTRANCE "A", use [address] to the 6th floor.  Registration desk to the right of elevators on 6th floor  If you have any trouble locating the clinic, please don't hesitate to call [phone].

## 2022-08-22 NOTE — H&P (Signed)
Electrophysiology Office Note   Date:  08/22/2022   ID:  Allen [name], DOB [birth year], MRN [id number]  PCP:  Street, [name], MD  Cardiologist:  Bettina Gavia Primary Electrophysiologist:  Haru Anspaugh Meredith Leeds, MD    Chief Complaint: AF   History of Present Illness: Allen [name] is a 81 y.o. male who is being seen today for the evaluation of AF at the request of No ref. provider found. Presenting today for electrophysiology evaluation.  He has a history significant for atrial fibrillation, CVA, hypertension, SVT post ablation.  He had an abdominal aortic aneurysm and is post EVAR.  He had multiple episodes of atrial fibrillation.  Had ablation several years ago for atrial fibrillation or SVT.  He has palpitations on a daily basis.  He has a Linq monitor that showed episodes of atrial fibrillation and has since been started on Multaq.  Today, denies symptoms of palpitations, chest pain, shortness of breath, orthopnea, PND, lower extremity edema, claudication, dizziness, presyncope, syncope, bleeding, or neurologic sequela. The patient is tolerating medications without difficulties. Plan ablation today for AF.    Past Medical History:  Diagnosis Date   AAA (abdominal aortic aneurysm) ([place])    Atrial fibrillation ([place])    Cardiomyopathy, nonischemic ([place]) 10/28/2018   CHF (congestive heart failure) ([place])    CVA (cerebral infarction) 1969   Hypertension    Irregular heart beat    Malaise and fatigue 11/05/2019   Murmur 11/05/2019   Palpitations 11/05/2019   Prostatitis    PSVT (paroxysmal supraventricular tachycardia) 05/22/2017   Formatting of this note might be different from the original. Prior ablation 2006   Thyroid disease    Hypothyrodidism   Varicose veins    Past Surgical History:  Procedure Laterality Date   ABDOMINAL AORTIC ANEURYSM REPAIR     ABLATION     CAROTID ENDARTERECTOMY  November 21, 2006   Left cea   ENDOVENOUS ABLATION SAPHENOUS VEIN W/ LASER Left  11-28-2012   left greater saphenous vein by Curt Jews MD   ENDOVENOUS ABLATION SAPHENOUS VEIN W/ LASER Right 12-26-2012   right gretaer saphenous vein by Curt Jews MD   INGUINAL HERNIA REPAIR     X's  4   INGUINAL HERNIA REPAIR  09-2011   left side   stab phlebectomy Left 03-13-2013   10-20 incisions by Curt Jews MD   TONSILLECTOMY AND ADENOIDECTOMY       No current facility-administered medications for this encounter.    Allergies:   Bactrim [sulfamethoxazole-trimethoprim] and Digoxin   Social History:  The patient  reports that he quit smoking about 28 years ago. His smoking use included cigarettes. He has been exposed to tobacco smoke. He has never used smokeless tobacco. He reports current alcohol use of about 2.0 - 4.0 standard drinks of alcohol per week. He reports that he does not use drugs.   Family History:  The patient's family history includes Angina in his mother; Heart disease in his sister; Hypertension in his sister; Stroke in his father.   ROS:  Please see the history of present illness.   Otherwise, review of systems is positive for none.   All other systems are reviewed and negative.   PHYSICAL EXAM: VS:  There were no vitals taken for this visit. , BMI There is no height or weight on file to calculate BMI. GEN: Well nourished, well developed, in no acute distress  HEENT: normal  Neck: no JVD, carotid bruits, or masses Cardiac: RRR; no  murmurs, rubs, or gallops,no edema  Respiratory:  clear to auscultation bilaterally, normal work of breathing GI: soft, nontender, nondistended, + BS MS: no deformity or atrophy  Skin: warm and dry Neuro:  Strength and sensation are intact Psych: euthymic mood, full affect  Recent Labs: 12/26/2021: ALT 12; TSH 0.547 04/21/2022: Magnesium 1.9 07/25/2022: BUN 13; Creatinine, Ser 1.01; Hemoglobin 13.1; Platelets 166; Potassium 4.6; Sodium 138    Lipid Panel     Component Value Date/Time   CHOL 107 12/26/2021 0859   TRIG  64 12/26/2021 0859   HDL 43 12/26/2021 0859   CHOLHDL 2.5 12/26/2021 0859   LDLCALC 50 12/26/2021 0859     Wt Readings from Last 3 Encounters:  06/30/22 88.9 kg  05/30/22 87.1 kg  04/27/22 88.4 kg      Other studies Reviewed: Additional studies/ records that were reviewed today include: Cardiac monitor 03/14/20 personally reviewed  Review of the above records today demonstrates:  Conclusion, rare ventricular ectopy occasional supraventricular ectopy and triggered and diary events associated with APCs and PVCs.    ASSESSMENT AND PLAN:  1.  Paroxysmal atrial fibrillation: Allen [name] has presented today for surgery, with the diagnosis of AF.  The various methods of treatment have been discussed with the patient and family. After consideration of risks, benefits and other options for treatment, the patient has consented to  Procedure(s): Catheter ablation as a surgical intervention .  Risks include but not limited to complete heart block, stroke, esophageal damage, nerve damage, bleeding, vascular damage, tamponade, perforation, MI, and death. The patient's history has been reviewed, patient examined, no change in status, stable for surgery.  I have reviewed the patient's chart and labs.  Questions were answered to the patient's satisfaction.    Jacqulyne Gladue Curt Bears, MD 08/22/2022 7:33 AM

## 2022-08-22 NOTE — Anesthesia Postprocedure Evaluation (Signed)
Anesthesia Post Note  Patient: Allen [name]  Procedure(s) Performed: ATRIAL FIBRILLATION ABLATION     Patient location during evaluation: PACU Anesthesia Type: General Level of consciousness: awake and alert Pain management: pain level controlled Vital Signs Assessment: post-procedure vital signs reviewed and stable Respiratory status: spontaneous breathing, nonlabored ventilation and respiratory function stable Cardiovascular status: stable and blood pressure returned to baseline Anesthetic complications: no   There were no known notable events for this encounter.  Last Vitals:  Vitals:   08/22/22 1515 08/22/22 1541  BP: 136/79 (!) 142/84  Pulse: 60 60  Resp: 12 15  Temp:  36.6 C  SpO2: 99% 98%    Last Pain:  Vitals:   08/22/22 1541  TempSrc: Oral  PainSc: 0-No pain                 Audry Pili

## 2022-08-22 NOTE — Anesthesia Preprocedure Evaluation (Addendum)
Anesthesia Evaluation  Patient identified by MRN, date of birth, ID band Patient awake    Reviewed: Allergy & Precautions, NPO status , Patient's Chart, lab work & pertinent test results  History of Anesthesia Complications Negative for: history of anesthetic complications  Airway Mallampati: II  TM Distance: >3 FB Neck ROM: Full    Dental  (+) Dental Advisory Given, Chipped,    Pulmonary former smoker   Pulmonary exam normal        Cardiovascular hypertension, Pt. on medications Normal cardiovascular exam+ dysrhythmias Atrial Fibrillation    '23 Myoperfusion -   The study is normal. The study is low risk.   Left ventricular function is normal. Nuclear stress EF: 56 %. The left ventricular ejection fraction is normal (55-65%). End diastolic cavity size is normal.   Prior study not available for comparison.  '23 TTE - EF 60 to 65%. The left ventricular internal cavity size was mildly dilated. There is mild left ventricular hypertrophy. Grade II diastolic dysfunction (pseudonormalization). Mild mitral valve regurgitation. Aortic valve regurgitation is mild. There is mild dilatation of the ascending aorta, measuring 39 mm.      Neuro/Psych TIA negative psych ROS   GI/Hepatic negative GI ROS, Neg liver ROS,,,  Endo/Other  Hypothyroidism    Renal/GU negative Renal ROS     Musculoskeletal negative musculoskeletal ROS (+)    Abdominal   Peds  Hematology  On coumadin    Anesthesia Other Findings   Reproductive/Obstetrics                             Anesthesia Physical Anesthesia Plan  ASA: 3  Anesthesia Plan: General   Post-op Pain Management: Tylenol PO (pre-op)* and Minimal or no pain anticipated   Induction: Intravenous  PONV Risk Score and Plan: 2 and Treatment may vary due to age or medical condition, Ondansetron and Propofol infusion  Airway Management Planned: Oral  ETT  Additional Equipment: None  Intra-op Plan:   Post-operative Plan: Extubation in OR  Informed Consent: I have reviewed the patients History and Physical, chart, labs and discussed the procedure including the risks, benefits and alternatives for the proposed anesthesia with the patient or authorized representative who has indicated his/her understanding and acceptance.     Dental advisory given  Plan Discussed with: CRNA and Anesthesiologist  Anesthesia Plan Comments:        Anesthesia Quick Evaluation

## 2022-08-22 NOTE — Progress Notes (Signed)
ANTICOAGULATION CONSULT NOTE - Initial Consult  Pharmacy Consult for Warfarin Indication: atrial fibrillation  Allergies  Allergen Reactions   Bactrim [Sulfamethoxazole-Trimethoprim] Hives   Digoxin Other (See Comments)    Rapid heart beat, chest pressure      Patient Measurements: Height: 6' (182.9 cm) Weight: 86.2 kg (190 lb) IBW/kg (Calculated) : 77.6   Vital Signs: Temp: 98.3 F (36.8 C) (12/19 1345) Temp Source: Temporal (12/19 1345) BP: 136/79 (12/19 1515) Pulse Rate: 60 (12/19 1515)  Labs: Recent Labs    08/22/22 0803  LABPROT 21.8*  INR 1.9*    CrCl cannot be calculated (Patient's most recent lab result is older than the maximum 21 days allowed.).   Medical History: Past Medical History:  Diagnosis Date   AAA (abdominal aortic aneurysm) ([place])    Atrial fibrillation ([place])    Cardiomyopathy, nonischemic ([place]) 10/28/2018   CHF (congestive heart failure) ([place])    CVA (cerebral infarction) 1969   Hypertension    Irregular heart beat    Malaise and fatigue 11/05/2019   Murmur 11/05/2019   Palpitations 11/05/2019   Prostatitis    PSVT (paroxysmal supraventricular tachycardia) 05/22/2017   Formatting of this note might be different from the original. Prior ablation 2006   Thyroid disease    Hypothyrodidism   Varicose veins     Medications:  Medications Prior to Admission  Medication Sig Dispense Refill Last Dose   acyclovir (ZOVIRAX) 800 MG tablet Take 400 mg by mouth 2 (two) times daily.   08/21/2022   finasteride (PROSCAR) 5 MG tablet Take 5 mg by mouth daily.   08/21/2022   levothyroxine (SYNTHROID) 150 MCG tablet Take 150 mcg by mouth daily before breakfast.   08/21/2022   psyllium (METAMUCIL) 58.6 % packet Take 1 packet by mouth 2 (two) times daily.   08/21/2022   silodosin (RAPAFLO) 8 MG CAPS capsule Take 8 mg by mouth at bedtime.   08/21/2022   simvastatin (ZOCOR) 20 MG tablet Take 10 mg by mouth at bedtime.   08/21/2022   Sodium Hyaluronate, oral,  (HYALURONIC ACID) 100 MG CAPS Take 100 mg by mouth every morning.   08/21/2022   tadalafil (CIALIS) 5 MG tablet Take 5 mg by mouth daily.   08/21/2022   Tamsulosin HCl (FLOMAX) 0.4 MG CAPS Take 0.4 mg by mouth in the morning. Reported on 01/04/2016   08/21/2022   warfarin (COUMADIN) 4 MG tablet TAKE 1 TO 1 & 1/2 (ONE & ONE-HALF) TABLETS BY MOUTH ONCE DAILY AS DIRECTED BY COUMADIN CLINIC 120 tablet 0 08/21/2022 at 0600   metoprolol tartrate (LOPRESSOR) 25 MG tablet Take 0.5 tablets (12.5 mg total) by mouth daily as needed (Heartrate Higher than 130 lasting 1 hour or more). 45 tablet 3 Unknown    Assessment: 81 yo male admitted by cardiology for surgical ablation for atrial fibrillation. Patient previously on warfarin PTA ('4mg'$  daily, except '6mg'$  on Fridays and Sundays). Per Home med list patient has not missed a dose and current INR is 1.9 today. Will resume home dose today.   Goal of Therapy:  INR 2-3 Monitor platelets by anticoagulation protocol: Yes   Plan:  Warfarin 4 mg Po x 1 today Daily INR Monitor for bleeding  Monica Zahler A. Levada Dy, PharmD, BCPS, FNKF Clinical Pharmacist [place] Please utilize Amion for appropriate phone number to reach the unit pharmacist ([place])  08/22/2022,3:39 PM

## 2022-08-22 NOTE — Anesthesia Procedure Notes (Signed)
Procedure Name: Intubation Date/Time: 08/22/2022 10:52 AM  Performed by: Lorie Phenix, CRNAPre-anesthesia Checklist: Patient identified, Emergency Drugs available, Suction available and Patient being monitored Patient Re-evaluated:Patient Re-evaluated prior to induction Oxygen Delivery Method: Circle system utilized Preoxygenation: Pre-oxygenation with 100% oxygen Induction Type: IV induction Ventilation: Mask ventilation without difficulty Laryngoscope Size: Mac and 4 Grade View: Grade III Tube type: Oral Tube size: 7.5 mm Number of attempts: 1 Airway Equipment and Method: Stylet Placement Confirmation: ETT inserted through vocal cords under direct vision, positive ETCO2 and breath sounds checked- equal and bilateral Secured at: 23 cm Tube secured with: Tape Dental Injury: Teeth and Oropharynx as per pre-operative assessment

## 2022-08-23 ENCOUNTER — Telehealth: Payer: Self-pay | Admitting: Cardiology

## 2022-08-23 ENCOUNTER — Encounter (HOSPITAL_COMMUNITY): Payer: Self-pay | Admitting: Cardiology

## 2022-08-23 DIAGNOSIS — I480 Paroxysmal atrial fibrillation: Secondary | ICD-10-CM | POA: Diagnosis not present

## 2022-08-23 DIAGNOSIS — I110 Hypertensive heart disease with heart failure: Secondary | ICD-10-CM | POA: Diagnosis not present

## 2022-08-23 DIAGNOSIS — I483 Typical atrial flutter: Secondary | ICD-10-CM | POA: Diagnosis not present

## 2022-08-23 DIAGNOSIS — I509 Heart failure, unspecified: Secondary | ICD-10-CM | POA: Diagnosis not present

## 2022-08-23 LAB — PROTIME-INR
INR: 2.7 — ABNORMAL HIGH (ref 0.8–1.2)
Prothrombin Time: 28.4 seconds — ABNORMAL HIGH (ref 11.4–15.2)

## 2022-08-23 MED ORDER — WARFARIN SODIUM 2 MG PO TABS: 4.0000 mg | ORAL_TABLET | Freq: Once | ORAL | Status: DC

## 2022-08-23 NOTE — Telephone Encounter (Signed)
Calling bout his coumadin level and would like to speak to you. Please advise

## 2022-08-23 NOTE — Progress Notes (Signed)
ANTICOAGULATION CONSULT NOTE - Initial Consult  Pharmacy Consult for Warfarin Indication: atrial fibrillation  Allergies  Allergen Reactions   Bactrim [Sulfamethoxazole-Trimethoprim] Hives   Digoxin Other (See Comments)    Rapid heart beat, chest pressure      Patient Measurements: Height: 6' (182.9 cm) Weight: 86.2 kg (190 lb) IBW/kg (Calculated) : 77.6   Vital Signs: Temp: 97.6 F (36.4 C) (12/20 0422) Temp Source: Oral (12/20 0422) BP: 127/68 (12/20 0422) Pulse Rate: 63 (12/20 0422)  Labs: Recent Labs    08/22/22 0803 08/23/22 0200  LABPROT 21.8* 28.4*  INR 1.9* 2.7*     CrCl cannot be calculated (Patient's most recent lab result is older than the maximum 21 days allowed.).   Medical History: Past Medical History:  Diagnosis Date   AAA (abdominal aortic aneurysm) ([place])    Atrial fibrillation ([place])    Cardiomyopathy, nonischemic ([place]) 10/28/2018   CHF (congestive heart failure) ([place])    CVA (cerebral infarction) 1969   Hypertension    Irregular heart beat    Malaise and fatigue 11/05/2019   Murmur 11/05/2019   Palpitations 11/05/2019   Prostatitis    PSVT (paroxysmal supraventricular tachycardia) 05/22/2017   Formatting of this note might be different from the original. Prior ablation 2006   Thyroid disease    Hypothyrodidism   Varicose veins     Medications:  Medications Prior to Admission  Medication Sig Dispense Refill Last Dose   acyclovir (ZOVIRAX) 800 MG tablet Take 400 mg by mouth 2 (two) times daily.   08/21/2022   finasteride (PROSCAR) 5 MG tablet Take 5 mg by mouth daily.   08/21/2022   levothyroxine (SYNTHROID) 150 MCG tablet Take 150 mcg by mouth daily before breakfast.   08/21/2022   psyllium (METAMUCIL) 58.6 % packet Take 1 packet by mouth 2 (two) times daily.   08/21/2022   silodosin (RAPAFLO) 8 MG CAPS capsule Take 8 mg by mouth at bedtime.   08/21/2022   simvastatin (ZOCOR) 20 MG tablet Take 10 mg by mouth at bedtime.   08/21/2022    Sodium Hyaluronate, oral, (HYALURONIC ACID) 100 MG CAPS Take 100 mg by mouth every morning.   08/21/2022   tadalafil (CIALIS) 5 MG tablet Take 5 mg by mouth daily.   08/21/2022   Tamsulosin HCl (FLOMAX) 0.4 MG CAPS Take 0.4 mg by mouth in the morning. Reported on 01/04/2016   08/21/2022   warfarin (COUMADIN) 4 MG tablet TAKE 1 TO 1 & 1/2 (ONE & ONE-HALF) TABLETS BY MOUTH ONCE DAILY AS DIRECTED BY COUMADIN CLINIC 120 tablet 0 08/21/2022 at 0600   metoprolol tartrate (LOPRESSOR) 25 MG tablet Take 0.5 tablets (12.5 mg total) by mouth daily as needed (Heartrate Higher than 130 lasting 1 hour or more). 45 tablet 3 Unknown    Assessment: 30 yoM on warfarin PTA for hx AFib admitted for AF ablation. Pharmacy to dose warfarin. INR therapeutic at 2.7.  Home dose '6mg'$  Fri/Sun, '4mg'$  all other days   Goal of Therapy:  INR 2-3 Monitor platelets by anticoagulation protocol: Yes   Plan:  Warfarin 4 mg Po x 1 today Daily INR  Arrie Senate, PharmD, BCPS, [facility] Clinical Pharmacist [phone] Please check AMION for all [place] numbers 08/23/2022

## 2022-08-23 NOTE — Discharge Summary (Addendum)
ELECTROPHYSIOLOGY PROCEDURE DISCHARGE SUMMARY    Patient ID: Allen [name],  MRN: [id number], DOB/AGE: [birth year] 81 y.o.  Admit date: 08/22/2022 Discharge date: 08/23/2022  Primary Care Physician: Street, [name], MD  Primary Cardiologist: Dr. Bettina Gavia Electrophysiologist: Dr. Curt Bears  Primary Discharge Diagnosis:  paroxysmal Atrial fibrillation, AFlutter      CHA2DS2Vasc is 6, on warfarin      Follows with our coumadin clinic in [place]  Secondary Discharge Diagnosis:  HTN SVT Remote (ablated, ?perhaps not successfully by the patient's report) AAA Hx of EVAR Stroke old hypothyroidism  Procedures This Admission:  1.  Electrophysiology study and radiofrequency catheter ablation on by Dr Curt Bears   This study demonstrated  CONCLUSIONS: 1. Sinus rhythm upon presentation.   2. Successful electrical isolation and anatomical encircling of all four pulmonary veins with radiofrequency current.  A WACA approach was used 3. Additional left atrial ablation was performed with a standard box lesion created along the posterior wall of the left atrium 4. Atrial flutter successfully cardioverted to sinus rhythm. 5. Ablation of typical atrial flutter 6. No early apparent complications..    Brief HPI: Allen [name] is a 81 y.o. male with a history of paroxysmal atrial fibrillation and AFlutter.  They have failed medical therapy with Tikosyn. Risks, benefits, and alternatives to catheter ablation of atrial fibrillation were reviewed with the patient who wished to proceed.    Hospital Course:  The patient was admitted and underwent EPS/ablation with details as outlined above.  They were monitored on telemetry overnight which demonstrated SR occ PACs, infrequent PVCs.  Groin sites are without complication on the day of discharge.  The patient feels well today, denies CP, SOB, site discomfort.  He was examined by Dr. Curt Bears and considered to be stable for discharge.  Wound care  and restrictions were reviewed with the patient.  The patient [name] be seen back by the Afib clinic in 4 weeks and Dr Curt Bears in 12 weeks for post ablation follow up.    Physical Exam: Vitals:   08/22/22 2030 08/22/22 2351 08/23/22 0422 08/23/22 0842  BP: 125/64 112/68 127/68 122/62  Pulse: 69 60 63 68  Resp: '17 19 18 18  '$ Temp: (!) 97.4 F (36.3 C) (!) 97.4 F (36.3 C) 97.6 F (36.4 C) 97.6 F (36.4 C)  TempSrc: Oral Oral Oral Oral  SpO2: 96% 95% 95% 99%  Weight:      Height:        GEN- The patient is well appearing, alert and oriented x 3 today.   HEENT: normocephalic, atraumatic; sclera clear, conjunctiva pink; hearing intact; oropharynx clear; neck supple  Lungs-  CTA b/l, normal work of breathing.  No wheezes, rales, rhonchi Heart-  RRR, no murmurs, rubs or gallops  GI- soft, non-tender, non-distended, bowel sounds present  Extremities- no clubbing, cyanosis, or edema; DP/PT/radial pulses 2+ bilaterally, b/l groins are without hematoma/bruit MS- no significant deformity or atrophy Skin- warm and dry, no rash or lesion Psych- euthymic mood, full affect Neuro- strength and sensation are intact   Labs:   Lab Results  Component Value Date   WBC 3.8 07/25/2022   HGB 13.1 07/25/2022   HCT 39.7 07/25/2022   MCV 94 07/25/2022   PLT 166 07/25/2022   No results for input(s): "NA", "K", "CL", "CO2", "BUN", "CREATININE", "CALCIUM", "PROT", "BILITOT", "ALKPHOS", "ALT", "AST", "GLUCOSE" in the last 168 hours.  Invalid input(s): "LABALBU"   Discharge Medications:  Allergies as of 08/23/2022  Reactions   Bactrim [sulfamethoxazole-trimethoprim] Hives   Digoxin Other (See Comments)   Rapid heart beat, chest pressure        Medication List     TAKE these medications    acyclovir 800 MG tablet Commonly known as: ZOVIRAX Take 400 mg by mouth 2 (two) times daily.   finasteride 5 MG tablet Commonly known as: PROSCAR Take 5 mg by mouth daily.   Hyaluronic Acid  100 MG Caps Take 100 mg by mouth every morning.   levothyroxine 150 MCG tablet Commonly known as: SYNTHROID Take 150 mcg by mouth daily before breakfast.   metoprolol tartrate 25 MG tablet Commonly known as: LOPRESSOR Take 0.5 tablets (12.5 mg total) by mouth daily as needed (Heartrate Higher than 130 lasting 1 hour or more).   psyllium 58.6 % packet Commonly known as: METAMUCIL Take 1 packet by mouth 2 (two) times daily.   silodosin 8 MG Caps capsule Commonly known as: RAPAFLO Take 8 mg by mouth at bedtime.   simvastatin 20 MG tablet Commonly known as: ZOCOR Take 10 mg by mouth at bedtime.   tadalafil 5 MG tablet Commonly known as: CIALIS Take 5 mg by mouth daily.   tamsulosin 0.4 MG Caps capsule Commonly known as: FLOMAX Take 0.4 mg by mouth in the morning. Reported on 01/04/2016   warfarin 4 MG tablet Commonly known as: COUMADIN Take as directed. If you are unsure how to take this medication, talk to your nurse or doctor. Original instructions: TAKE 1 TO 1 & 1/2 (ONE & ONE-HALF) TABLETS BY MOUTH ONCE DAILY AS DIRECTED BY COUMADIN CLINIC Notes to patient: Continue your usual dose/schedule         Disposition: Home Discharge Instructions     Diet - low sodium heart healthy   Complete by: As directed    Increase activity slowly   Complete by: As directed         Duration of Discharge Encounter: Greater than 30 minutes including physician time.  Venetia Night, PA-C 08/23/2022 9:31 AM   I have seen and examined this patient with Tommye Standard.  Agree with above, note added to reflect my findings.  Is status post atrial fibrillation ablation.  Overnight with no acute events.  Has remained in sinus rhythm.  Plan for discharge today with follow-up in clinic.  GEN: Well nourished, well developed, in no acute distress  HEENT: normal  Neck: no JVD, carotid bruits, or masses Cardiac: RRR; no murmurs, rubs, or gallops,no edema  Respiratory:  clear to  auscultation bilaterally, normal work of breathing GI: soft, nontender, nondistended, + BS MS: no deformity or atrophy  Skin: warm and dry Neuro:  Strength and sensation are intact Psych: euthymic mood, full affect     Kiara Keep M. Laasia Arcos MD 08/23/2022 11:44 AM

## 2022-08-23 NOTE — Telephone Encounter (Signed)
Returned pt's call. Pt wanted to make coumadin clinic aware of his INR that was drawn this morning at hospital, post ablation. I made pt aware that his INR was within range and he should continue Warfarin until upcoming coumadin clinic appt.

## 2022-08-23 NOTE — Plan of Care (Signed)

## 2022-09-05 ENCOUNTER — Ambulatory Visit (INDEPENDENT_AMBULATORY_CARE_PROVIDER_SITE_OTHER): Payer: No Typology Code available for payment source

## 2022-09-05 ENCOUNTER — Ambulatory Visit: Payer: No Typology Code available for payment source | Attending: Cardiology

## 2022-09-05 DIAGNOSIS — Z7901 Long term (current) use of anticoagulants: Secondary | ICD-10-CM

## 2022-09-05 DIAGNOSIS — I4891 Unspecified atrial fibrillation: Secondary | ICD-10-CM

## 2022-09-05 LAB — POCT INR: INR: 2 (ref 2.0–3.0)

## 2022-09-05 LAB — CUP PACEART REMOTE DEVICE CHECK
Date Time Interrogation Session: 20240101230730
Implantable Pulse Generator Implant Date: 20220906

## 2022-09-05 NOTE — Patient Instructions (Signed)
Description   Take an extra 1/2 tablet today and then continue taking 1 tablet daily except 1.5 tablets on Sundays and Fridays. (ON SUNDAY, 12/17 TAKE AND EXTRA 1/2 TABLET)  Stay consistent with greens  INR check in 4 week   Coumadin Clinic [phone]

## 2022-09-11 NOTE — Progress Notes (Signed)
Carelink Summary Report / Loop Recorder 

## 2022-09-20 ENCOUNTER — Encounter (HOSPITAL_COMMUNITY): Payer: Self-pay | Admitting: Nurse Practitioner

## 2022-09-20 ENCOUNTER — Ambulatory Visit (HOSPITAL_COMMUNITY)
Admission: RE | Admit: 2022-09-20 | Discharge: 2022-09-20 | Disposition: A | Discharge status: Home / Self Care | Payer: No Typology Code available for payment source | Source: Ambulatory Visit | Attending: Nurse Practitioner | Admitting: Nurse Practitioner

## 2022-09-20 VITALS — BP 148/86 | HR 78 | Ht 72.0 in | Wt 196.4 lb

## 2022-09-20 DIAGNOSIS — I4892 Unspecified atrial flutter: Secondary | ICD-10-CM | POA: Insufficient documentation

## 2022-09-20 DIAGNOSIS — Z87891 Personal history of nicotine dependence: Secondary | ICD-10-CM | POA: Diagnosis not present

## 2022-09-20 DIAGNOSIS — Z95811 Presence of heart assist device: Secondary | ICD-10-CM | POA: Insufficient documentation

## 2022-09-20 DIAGNOSIS — Z7901 Long term (current) use of anticoagulants: Secondary | ICD-10-CM | POA: Diagnosis not present

## 2022-09-20 DIAGNOSIS — R002 Palpitations: Secondary | ICD-10-CM | POA: Insufficient documentation

## 2022-09-20 DIAGNOSIS — Z79899 Other long term (current) drug therapy: Secondary | ICD-10-CM | POA: Insufficient documentation

## 2022-09-20 DIAGNOSIS — I480 Paroxysmal atrial fibrillation: Secondary | ICD-10-CM | POA: Insufficient documentation

## 2022-09-20 DIAGNOSIS — I10 Essential (primary) hypertension: Secondary | ICD-10-CM | POA: Diagnosis not present

## 2022-09-20 DIAGNOSIS — Z8673 Personal history of transient ischemic attack (TIA), and cerebral infarction without residual deficits: Secondary | ICD-10-CM | POA: Insufficient documentation

## 2022-09-20 DIAGNOSIS — I714 Abdominal aortic aneurysm, without rupture, unspecified: Secondary | ICD-10-CM | POA: Insufficient documentation

## 2022-09-20 MED ORDER — METOPROLOL TARTRATE 25 MG PO TABS: 12.5000 mg | ORAL_TABLET | Freq: Two times a day (BID) | ORAL | 1 refills | Status: DC

## 2022-09-20 NOTE — Progress Notes (Signed)
Primary Care Physician: Street, Allen Mt, MD Referring Physician:Dr. [name] is a 82 y.o. male with a h/o  significant for atrial fibrillation, CVA, hypertension, SVT post ablation.  He had an abdominal aortic aneurysm and is post EVAR.  He had multiple episodes of atrial fibrillation.  Had ablation several years ago for atrial fibrillation or SVT.  He has palpitations on a daily basis.  He has a Linq monitor that showed episodes of atrial fibrillation and was on Multaq very shortly months ago. He did not feel well on Multaq.  He was seen by Dr. Curt [name] 6/13 and Tikosyn admission was discussed. He is now here now  for Tikosyn admit. He is in Sinus brady at 45 bpm with a qtc interval of 392 ms. He is on warfarin and has had 5 weeks of therapeutic INR's as of this am. No benadryl use.   Pt is back for one week f/u, 04/27/22, for tikosyn admit.   He had intermittent afib in the hospital on tikosyn. He also had some conversion pauses that were minimized with stopping BB. Dr. Curt [name] felt if he was out of rhythm today then stop tikosyn and for pt to consider amiodarone and/or abaltion. Pt states that the afternoon he went home he had some slurred speech but no other symptoms, he felt was from being overly tired as he did not get to sleep in the hospital. The next am after a good night sleep, his speech was normal.   F/u in afib clinic, 09/20/21. He is in atypical  atrial flutter today, rate controlled. He is s/p ablation one month ago.  He is aware and feels he has been in and out. Review of Linq appears to support that. He also noted high blood pressure for the last 1-2 days and took a lisinopril 10 mg yesterday. No swallowing or groin issues. Overall feels ok.   Today, he denies symptoms of palpitations, chest pain, shortness of breath, orthopnea, PND, lower extremity edema, dizziness, presyncope, syncope, or neurologic sequela. The patient is tolerating medications without  difficulties and is otherwise without complaint today.   Past Medical History:  Diagnosis Date   AAA (abdominal aortic aneurysm) (Allen [name])    Atrial fibrillation (HCC)    Cardiomyopathy, nonischemic ([place]) 10/28/2018   CHF (congestive heart failure) ([place])    CVA (cerebral infarction) 1969   Hypertension    Irregular heart beat    Malaise and fatigue 11/05/2019   Murmur 11/05/2019   Palpitations 11/05/2019   Prostatitis    PSVT (paroxysmal supraventricular tachycardia) 05/22/2017   Formatting of this note might be different from the original. Prior ablation 2006   Thyroid disease    Hypothyrodidism   Varicose veins    Past Surgical History:  Procedure Laterality Date   ABDOMINAL AORTIC ANEURYSM REPAIR     ABLATION     ATRIAL FIBRILLATION ABLATION N/A 08/22/2022   Procedure: ATRIAL FIBRILLATION ABLATION;  Surgeon: Allen Haw, MD;  Location: [facility];  Service: Cardiovascular;  Laterality: N/A;   CAROTID ENDARTERECTOMY  November 21, 2006   Left cea   ENDOVENOUS ABLATION SAPHENOUS VEIN W/ LASER Left 11-28-2012   left greater saphenous vein by Allen Jews MD   ENDOVENOUS ABLATION SAPHENOUS VEIN W/ LASER Right 12-26-2012   right gretaer saphenous vein by Allen Jews MD   INGUINAL HERNIA REPAIR     X's  4   INGUINAL HERNIA REPAIR  09-2011   left side  stab phlebectomy Left 03-13-2013   10-20 incisions by Allen Jews MD   TONSILLECTOMY AND ADENOIDECTOMY      Current Outpatient Medications  Medication Sig Dispense Refill   acyclovir (ZOVIRAX) 800 MG tablet Take 400 mg by mouth 2 (two) times daily.     finasteride (PROSCAR) 5 MG tablet Take 5 mg by mouth daily.     levothyroxine (SYNTHROID) 150 MCG tablet Take 150 mcg by mouth daily before breakfast.     metoprolol tartrate (LOPRESSOR) 25 MG tablet Take 0.5 tablets (12.5 mg total) by mouth daily as needed (Heartrate Higher than 130 lasting 1 hour or more). 45 tablet 3   psyllium (METAMUCIL) 58.6 % packet Take 1 packet by mouth  2 (two) times daily.     silodosin (RAPAFLO) 8 MG CAPS capsule Take 8 mg by mouth at bedtime.     simvastatin (ZOCOR) 20 MG tablet Take 10 mg by mouth at bedtime.     Sodium Hyaluronate, oral, (HYALURONIC ACID) 100 MG CAPS Take 100 mg by mouth every morning.     tadalafil (CIALIS) 5 MG tablet Take 5 mg by mouth daily.     Tamsulosin HCl (FLOMAX) 0.4 MG CAPS Take 0.4 mg by mouth in the morning. Reported on 01/04/2016     warfarin (COUMADIN) 4 MG tablet TAKE 1 TO 1 & 1/2 (ONE & ONE-HALF) TABLETS BY MOUTH ONCE DAILY AS DIRECTED BY COUMADIN CLINIC 120 tablet 0   No current facility-administered medications for this encounter.    Allergies  Allergen Reactions   Bactrim [Sulfamethoxazole-Trimethoprim] Hives   Digoxin Other (See Comments)    Rapid heart beat, chest pressure      Social History   Socioeconomic History   Marital status: Divorced    Spouse name: Not on file   Number of children: Not on file   Years of education: Not on file   Highest education level: Not on file  Occupational History   Not on file  Tobacco Use   Smoking status: Former    Types: Cigarettes    Quit date: 09/04/1993    Years since quitting: 29.0    Passive exposure: Past   Smokeless tobacco: Never  Vaping Use   Vaping Use: Never used  Substance and Sexual Activity   Alcohol use: Yes    Alcohol/week: 2.0 - 4.0 standard drinks of alcohol    Types: 2 - 4 Glasses of wine per week   Drug use: No   Sexual activity: Not on file  Other Topics Concern   Not on file  Social History Narrative   Not on file   Social Determinants of Health   Financial Resource Strain: Not on file  Food Insecurity: Not on file  Transportation Needs: Not on file  Physical Activity: Not on file  Stress: Not on file  Social Connections: Not on file  Intimate Partner Violence: Not on file    Family History  Problem Relation Age of Onset   Stroke Father    Angina Mother    Hypertension Sister    Heart disease Sister      ROS- All systems are reviewed and negative except as per the HPI above  Physical Exam: Vitals:   09/20/22 1004  BP: (!) 148/86  Pulse: 78  Weight: 89.1 kg  Height: 6' (1.829 m)   Wt Readings from Last 3 Encounters:  09/20/22 89.1 kg  08/22/22 86.2 kg  06/30/22 88.9 kg    Labs: Lab Results  Component Value Date  NA 138 07/25/2022   K 4.6 07/25/2022   CL 103 07/25/2022   CO2 26 07/25/2022   GLUCOSE 98 07/25/2022   BUN 13 07/25/2022   CREATININE 1.01 07/25/2022   CALCIUM 8.7 07/25/2022   MG 1.9 04/21/2022   Lab Results  Component Value Date   INR 2.0 09/05/2022   Lab Results  Component Value Date   CHOL 107 12/26/2021   HDL 43 12/26/2021   LDLCALC 50 12/26/2021   TRIG 64 12/26/2021     GEN- The patient is well appearing, alert and oriented x 3 today.   Head- normocephalic, atraumatic Eyes-  Sclera clear, conjunctiva pink Ears- hearing intact Oropharynx- clear Neck- supple, no JVP Lymph- no cervical lymphadenopathy Lungs- Clear to ausculation bilaterally, normal work of breathing Heart- Regular rate but abnormal rhythm, no murmurs, rubs or gallops, PMI not laterally displaced GI- soft, NT, ND, + BS Extremities- no clubbing, cyanosis, or edema MS- no significant deformity or atrophy Skin- no rash or lesion Psych- euthymic mood, full affect Neuro- strength and sensation are intact  EKG- Vent. rate 78 BPM PR interval * ms QRS duration 76 ms QT/QTcB 366/417 ms P-R-T axes 81 92 70 Atrial flutter with 4:1 A-V conduction Rightward axis Septal infarct , age undetermined Abnormal ECG When compared with ECG of 23-Aug-2022 04:34, PREVIOUS ECG IS PRESENT    Assessment and Plan:  1. Afib/flutter  Tikosyn load was not successful to keep pt in rhythm   He is now s/p an ablation since 08/24/23. He appears to be in rhythm intermittently Rate controlled aflutter today I discussed restarting BB with Dr. Curt [name] to help encourage SR but had post conversion  pauses worsened on BB  For that reason, and the fact  pt does not appear to feel bad, will continue to watch for now   2. CHA2DS2VASc  score of at least 4  Continue warfarin   3. HTN  Stable  Elevated for the last 1-2 days and pt took lisinopril 10 mg daily yesterday For now continue to watch at home   F/u with afib clinic in 10 days   Herrings. Jadriel Saxer, [facility] [address] [place], [place] [address] [phone]

## 2022-10-03 ENCOUNTER — Encounter: Payer: Self-pay | Admitting: Cardiology

## 2022-10-03 ENCOUNTER — Ambulatory Visit: Payer: No Typology Code available for payment source | Attending: Cardiology

## 2022-10-03 DIAGNOSIS — I4891 Unspecified atrial fibrillation: Secondary | ICD-10-CM

## 2022-10-03 DIAGNOSIS — Z7901 Long term (current) use of anticoagulants: Secondary | ICD-10-CM | POA: Diagnosis not present

## 2022-10-03 LAB — POCT INR: INR: 1.9 — AB (ref 2.0–3.0)

## 2022-10-03 NOTE — Patient Instructions (Signed)
Description   Take an extra 1/2 tablet today and then continue taking 1 tablet daily except 1.5 tablets on Sundays and Fridays.  Stay consistent with greens  INR check in 4 week   Coumadin Clinic [phone]

## 2022-10-04 ENCOUNTER — Ambulatory Visit (HOSPITAL_COMMUNITY)
Admission: RE | Admit: 2022-10-04 | Discharge: 2022-10-04 | Disposition: A | Discharge status: Home / Self Care | Payer: No Typology Code available for payment source | Source: Ambulatory Visit | Attending: Nurse Practitioner | Admitting: Nurse Practitioner

## 2022-10-04 VITALS — BP 164/90 | HR 79 | Ht 72.0 in | Wt 192.6 lb

## 2022-10-04 DIAGNOSIS — I4892 Unspecified atrial flutter: Secondary | ICD-10-CM | POA: Insufficient documentation

## 2022-10-04 DIAGNOSIS — Z79899 Other long term (current) drug therapy: Secondary | ICD-10-CM | POA: Insufficient documentation

## 2022-10-04 DIAGNOSIS — I480 Paroxysmal atrial fibrillation: Secondary | ICD-10-CM | POA: Diagnosis not present

## 2022-10-04 DIAGNOSIS — I484 Atypical atrial flutter: Secondary | ICD-10-CM | POA: Diagnosis not present

## 2022-10-04 DIAGNOSIS — R002 Palpitations: Secondary | ICD-10-CM | POA: Insufficient documentation

## 2022-10-04 DIAGNOSIS — Z8673 Personal history of transient ischemic attack (TIA), and cerebral infarction without residual deficits: Secondary | ICD-10-CM | POA: Insufficient documentation

## 2022-10-04 DIAGNOSIS — I10 Essential (primary) hypertension: Secondary | ICD-10-CM | POA: Insufficient documentation

## 2022-10-04 DIAGNOSIS — D6869 Other thrombophilia: Secondary | ICD-10-CM | POA: Diagnosis not present

## 2022-10-04 DIAGNOSIS — Z7901 Long term (current) use of anticoagulants: Secondary | ICD-10-CM | POA: Insufficient documentation

## 2022-10-04 NOTE — Progress Notes (Signed)
Primary Care Physician: Street, [name], MD Referring Physician:Dr. Kortney Schoenfelder is a 82 y.o. male with a h/o  significant for atrial fibrillation, CVA, hypertension, SVT post ablation.  He had an abdominal aortic aneurysm and is post EVAR.  He had multiple episodes of atrial fibrillation.  Had ablation several years ago for atrial fibrillation or SVT.  He has palpitations on a daily basis.  He has a Linq monitor that showed episodes of atrial fibrillation and was on Multaq very shortly months ago. He did not feel well on Multaq.  He was seen by Allen [name] 6/13 and Tikosyn admission was discussed. He is now here now  for Tikosyn admit. He is in Sinus brady at 45 bpm with a qtc interval of 392 ms. He is on warfarin and has had 5 weeks of therapeutic INR's as of this am. No benadryl use.   Pt is back for one week f/u, 04/27/22, for tikosyn admit.   He had intermittent afib in the hospital on tikosyn. He also had some conversion pauses that were minimized with stopping BB. Allen [name] felt if he was out of rhythm today then stop tikosyn and for pt to consider amiodarone and/or abaltion. Pt states that the afternoon he went home he had some slurred speech but no other symptoms, he felt was from being overly tired as he did not get to sleep in the hospital. The next am after a good night sleep, his speech was normal.   F/u in afib clinic, 09/20/22. He is in atypical  atrial flutter today, rate controlled. He is s/p ablation one month ago.  He is aware and feels he has been in and out. Review of Linq appears to support that. He also noted high blood pressure for the last 1-2 days and took a lisinopril 10 mg yesterday. No swallowing or groin issues. Overall feels ok.   F/u in afib clinic 10/04/22. He is in rate controlled atrial flutter today. Review of ling shows he is likely persistent now. He does not feel as well as he did around 2 weeks right after ablation. He brings in BP readings at  home and looks like he needs to add back lisinopril 10 mg daily. We discussed cardioversion and he is in agreement to purse but will need 4 therapeutic INR's. Checked yesterday and was sub therapeutic at 1.9.  Today, he denies symptoms of palpitations, chest pain, shortness of breath, orthopnea, PND, lower extremity edema, dizziness, presyncope, syncope, or neurologic sequela. The patient is tolerating medications without difficulties and is otherwise without complaint today.   Past Medical History:  Diagnosis Date   AAA (abdominal aortic aneurysm) ([place])    Atrial fibrillation (HCC)    Cardiomyopathy, nonischemic ([place]) 10/28/2018   CHF (congestive heart failure) ([place])    CVA (cerebral infarction) 1969   Hypertension    Irregular heart beat    Malaise and fatigue 11/05/2019   Murmur 11/05/2019   Palpitations 11/05/2019   Prostatitis    PSVT (paroxysmal supraventricular tachycardia) 05/22/2017   Formatting of this note might be different from the original. Prior ablation 2006   Thyroid disease    Hypothyrodidism   Varicose veins    Past Surgical History:  Procedure Laterality Date   ABDOMINAL AORTIC ANEURYSM REPAIR     ABLATION     ATRIAL FIBRILLATION ABLATION N/A 08/22/2022   Procedure: ATRIAL FIBRILLATION ABLATION;  Surgeon: Constance Haw, MD;  Location: [facility];  Service:  Cardiovascular;  Laterality: N/A;   CAROTID ENDARTERECTOMY  November 21, 2006   Left cea   ENDOVENOUS ABLATION SAPHENOUS VEIN W/ LASER Left 11-28-2012   left greater saphenous vein by Curt Jews MD   ENDOVENOUS ABLATION SAPHENOUS VEIN W/ LASER Right 12-26-2012   right gretaer saphenous vein by Curt Jews MD   INGUINAL HERNIA REPAIR     X's  4   INGUINAL HERNIA REPAIR  09-2011   left side   stab phlebectomy Left 03-13-2013   10-20 incisions by Curt Jews MD   TONSILLECTOMY AND ADENOIDECTOMY      Current Outpatient Medications  Medication Sig Dispense Refill   acyclovir (ZOVIRAX) 800 MG tablet  Take 400 mg by mouth 2 (two) times daily.     finasteride (PROSCAR) 5 MG tablet Take 5 mg by mouth daily.     levothyroxine (SYNTHROID) 150 MCG tablet Take 150 mcg by mouth daily before breakfast.     lisinopril (ZESTRIL) 10 MG tablet Take 10 mg by mouth as needed.     PSYLLIUM PO 2 tablespoons twice daily     silodosin (RAPAFLO) 8 MG CAPS capsule Take 8 mg by mouth at bedtime.     simvastatin (ZOCOR) 20 MG tablet Take 10 mg by mouth at bedtime.     Sodium Hyaluronate, oral, (HYALURONIC ACID) 100 MG CAPS Take 100 mg by mouth every morning.     tadalafil (CIALIS) 5 MG tablet Take 5 mg by mouth daily.     Tamsulosin HCl (FLOMAX) 0.4 MG CAPS Take 0.4 mg by mouth in the morning. Reported on 01/04/2016     warfarin (COUMADIN) 4 MG tablet TAKE 1 TO 1 & 1/2 (ONE & ONE-HALF) TABLETS BY MOUTH ONCE DAILY AS DIRECTED BY COUMADIN CLINIC 120 tablet 0   metoprolol tartrate (LOPRESSOR) 25 MG tablet Take 0.5 tablets (12.5 mg total) by mouth 2 (two) times daily. (Patient not taking: Reported on 10/04/2022) 45 tablet 1   No current facility-administered medications for this encounter.    Allergies  Allergen Reactions   Bactrim [Sulfamethoxazole-Trimethoprim] Hives   Digoxin Other (See Comments)    Rapid heart beat, chest pressure      Social History   Socioeconomic History   Marital status: Divorced    Spouse name: Not on file   Number of children: Not on file   Years of education: Not on file   Highest education level: Not on file  Occupational History   Not on file  Tobacco Use   Smoking status: Former    Types: Cigarettes    Quit date: 09/04/1993    Years since quitting: 29.1    Passive exposure: Past   Smokeless tobacco: Never  Vaping Use   Vaping Use: Never used  Substance and Sexual Activity   Alcohol use: Yes    Alcohol/week: 2.0 - 4.0 standard drinks of alcohol    Types: 2 - 4 Glasses of wine per week   Drug use: No   Sexual activity: Not on file  Other Topics Concern   Not on  file  Social History Narrative   Not on file   Social Determinants of Health   Financial Resource Strain: Not on file  Food Insecurity: Not on file  Transportation Needs: Not on file  Physical Activity: Not on file  Stress: Not on file  Social Connections: Not on file  Intimate Partner Violence: Not on file    Family History  Problem Relation Age of Onset   Stroke Father  Angina Mother    Hypertension Sister    Heart disease Sister     ROS- All systems are reviewed and negative except as per the HPI above  Physical Exam: Vitals:   10/04/22 1041  BP: (!) 164/90  Pulse: 79  Weight: 87.4 kg  Height: 6' (1.829 m)   Wt Readings from Last 3 Encounters:  10/04/22 87.4 kg  09/20/22 89.1 kg  08/22/22 86.2 kg    Labs: Lab Results  Component Value Date   NA 138 07/25/2022   K 4.6 07/25/2022   CL 103 07/25/2022   CO2 26 07/25/2022   GLUCOSE 98 07/25/2022   BUN 13 07/25/2022   CREATININE 1.01 07/25/2022   CALCIUM 8.7 07/25/2022   MG 1.9 04/21/2022   Lab Results  Component Value Date   INR 1.9 (A) 10/03/2022   Lab Results  Component Value Date   CHOL 107 12/26/2021   HDL 43 12/26/2021   LDLCALC 50 12/26/2021   TRIG 64 12/26/2021     GEN- The patient is well appearing, alert and oriented x 3 today.   Head- normocephalic, atraumatic Eyes-  Sclera clear, conjunctiva pink Ears- hearing intact Oropharynx- clear Neck- supple, no JVP Lymph- no cervical lymphadenopathy Lungs- Clear to ausculation bilaterally, normal work of breathing Heart- regular rate but abnormal rhythm, no murmurs, rubs or gallops, PMI not laterally displaced GI- soft, NT, ND, + BS Extremities- no clubbing, cyanosis, or edema MS- no significant deformity or atrophy Skin- no rash or lesion Psych- euthymic mood, full affect Neuro- strength and sensation are intact  EKG-  Vent. rate 79 BPM PR interval * ms QRS duration 94 ms QT/QTcB 376/431 ms P-R-T axes * 79 73 Atrial flutter with  4:1 A-V conduction Incomplete right bundle branch block Abnormal ECG When compared with ECG of 20-Sep-2022 10:50, PREVIOUS ECG IS PRESENT    Assessment and Plan:  1. Afib/flutter  Tikosyn load was not successful to keep pt in rhythm   He is now s/p an ablation since 08/24/23. EKG shows atrial flutter, review of linq and appears to be now persistent  We discussed DCCV, risk vrs  benefit and he wants to pursue  I will schedule for cardioversion after 4 therapeutic INR's, Allen [name]  Rate controlled aflutter today Rate control meds are not being uses as he has h/o  post conversion pauses worsened on BB   2. CHA2DS2VASc  score of at least 4  Continue warfarin with weekly INR's to prepare for CV Yesterday per pt, he was 1.9  3. HTN   Elevated  Brings in BP logs and  it does show elevated BP's at home Restart lisinopril 10 mg daily instead of using prn    Will scheduled DCCV when 4 therapeutic INR's received   Allen [name], [facility] [address] [place], [place] [address] [phone]

## 2022-10-05 NOTE — Progress Notes (Signed)
[facility] Summary Report / Loop Recorder

## 2022-10-08 LAB — CUP PACEART REMOTE DEVICE CHECK
Date Time Interrogation Session: 20240203230500
Implantable Pulse Generator Implant Date: 20220906

## 2022-10-09 ENCOUNTER — Ambulatory Visit: Payer: No Typology Code available for payment source

## 2022-10-09 DIAGNOSIS — I480 Paroxysmal atrial fibrillation: Secondary | ICD-10-CM

## 2022-10-10 ENCOUNTER — Ambulatory Visit: Payer: No Typology Code available for payment source | Attending: Cardiology

## 2022-10-10 DIAGNOSIS — Z7901 Long term (current) use of anticoagulants: Secondary | ICD-10-CM

## 2022-10-10 DIAGNOSIS — I4891 Unspecified atrial fibrillation: Secondary | ICD-10-CM | POA: Diagnosis not present

## 2022-10-10 LAB — POCT INR: INR: 2.5 (ref 2.0–3.0)

## 2022-10-10 NOTE — Patient Instructions (Signed)
Description   Continue taking 1 tablet daily except 1.5 tablets on Sundays and Fridays.  Stay consistent with greens  INR check in 1 week   [facility] [phone]      

## 2022-10-11 ENCOUNTER — Encounter: Payer: Self-pay | Admitting: Cardiology

## 2022-10-12 ENCOUNTER — Encounter (HOSPITAL_COMMUNITY): Payer: Self-pay | Admitting: *Deleted

## 2022-10-17 ENCOUNTER — Ambulatory Visit: Payer: No Typology Code available for payment source | Attending: Cardiology

## 2022-10-17 DIAGNOSIS — I4891 Unspecified atrial fibrillation: Secondary | ICD-10-CM | POA: Diagnosis not present

## 2022-10-17 DIAGNOSIS — Z7901 Long term (current) use of anticoagulants: Secondary | ICD-10-CM

## 2022-10-17 LAB — POCT INR: INR: 2.2 (ref 2.0–3.0)

## 2022-10-17 NOTE — Patient Instructions (Signed)
Description   Take 1.5 tablets today and then continue taking 1 tablet daily except 1.5 tablets on Sundays and Fridays.  Stay consistent with greens  INR check in 1 week   [facility] Clinic [phone]

## 2022-10-20 ENCOUNTER — Telehealth (HOSPITAL_COMMUNITY): Payer: Self-pay | Admitting: *Deleted

## 2022-10-20 ENCOUNTER — Other Ambulatory Visit (HOSPITAL_COMMUNITY): Payer: Self-pay | Admitting: *Deleted

## 2022-10-20 DIAGNOSIS — I484 Atypical atrial flutter: Secondary | ICD-10-CM

## 2022-10-20 NOTE — Telephone Encounter (Signed)
Cardioversion scheduled for 3/5 arrival at 10am npo after mn. Continue weekly INR checks. Follow up with Dr. Curt Bears as scheduled.

## 2022-10-24 ENCOUNTER — Ambulatory Visit: Payer: No Typology Code available for payment source | Attending: Cardiology

## 2022-10-24 DIAGNOSIS — I484 Atypical atrial flutter: Secondary | ICD-10-CM

## 2022-10-24 DIAGNOSIS — Z7901 Long term (current) use of anticoagulants: Secondary | ICD-10-CM

## 2022-10-24 DIAGNOSIS — I4891 Unspecified atrial fibrillation: Secondary | ICD-10-CM

## 2022-10-24 LAB — POCT INR: INR: 2.1 (ref 2.0–3.0)

## 2022-10-24 NOTE — Patient Instructions (Signed)
Description   Take 2 tablets today and then continue taking 1 tablet daily except 1.5 tablets on Sundays and Fridays.  Stay consistent with greens  INR check in 1 week   [facility] Clinic [phone]

## 2022-10-26 ENCOUNTER — Other Ambulatory Visit (HOSPITAL_COMMUNITY): Payer: Self-pay

## 2022-10-31 ENCOUNTER — Ambulatory Visit: Payer: No Typology Code available for payment source | Attending: Cardiology

## 2022-10-31 DIAGNOSIS — Z7901 Long term (current) use of anticoagulants: Secondary | ICD-10-CM | POA: Diagnosis not present

## 2022-10-31 DIAGNOSIS — I4891 Unspecified atrial fibrillation: Secondary | ICD-10-CM

## 2022-10-31 LAB — POCT INR: INR: 2 (ref 2.0–3.0)

## 2022-10-31 NOTE — Patient Instructions (Addendum)
Description   Take 2 tablets tomorrow and then continue taking 1 tablet daily except 1.5 tablets on Sundays and Fridays.  Stay consistent with greens  INR check in 1 week   [facility]in Clinic [phone]

## 2022-11-01 LAB — BASIC METABOLIC PANEL
BUN/Creatinine Ratio: 14 (ref 10–24)
BUN: 14 mg/dL (ref 8–27)
CO2: 24 mmol/L (ref 20–29)
Calcium: 9.1 mg/dL (ref 8.6–10.2)
Chloride: 101 mmol/L (ref 96–106)
Creatinine, Ser: 1.02 mg/dL (ref 0.76–1.27)
Glucose: 101 mg/dL — ABNORMAL HIGH (ref 70–99)
Potassium: 4.5 mmol/L (ref 3.5–5.2)
Sodium: 137 mmol/L (ref 134–144)
eGFR: 74 mL/min/{1.73_m2} (ref >59)

## 2022-11-01 LAB — CBC
Hematocrit: 42.8 % (ref 37.5–51.0)
Hemoglobin: 14.2 g/dL (ref 13.0–17.7)
MCH: 31.1 pg (ref 26.6–33.0)
MCHC: 33.2 g/dL (ref 31.5–35.7)
MCV: 94 fL (ref 79–97)
Platelets: 179 10*3/uL (ref 150–450)
RBC: 4.57 x10E6/uL (ref 4.14–5.80)
RDW: 12.2 % (ref 11.6–15.4)
WBC: 5.4 10*3/uL (ref 3.4–10.8)

## 2022-11-07 ENCOUNTER — Encounter (HOSPITAL_COMMUNITY): Payer: Self-pay | Admitting: Cardiology

## 2022-11-07 ENCOUNTER — Encounter (HOSPITAL_COMMUNITY)
Admission: RE | Disposition: A | Discharge status: Home / Self Care | Payer: Self-pay | Source: Ambulatory Visit | Attending: Cardiology

## 2022-11-07 ENCOUNTER — Other Ambulatory Visit: Payer: Self-pay

## 2022-11-07 ENCOUNTER — Ambulatory Visit (HOSPITAL_COMMUNITY): Payer: No Typology Code available for payment source | Admitting: Certified Registered Nurse Anesthetist

## 2022-11-07 ENCOUNTER — Ambulatory Visit (INDEPENDENT_AMBULATORY_CARE_PROVIDER_SITE_OTHER): Payer: No Typology Code available for payment source

## 2022-11-07 ENCOUNTER — Ambulatory Visit (HOSPITAL_COMMUNITY)
Admission: RE | Admit: 2022-11-07 | Discharge: 2022-11-07 | Disposition: A | Discharge status: Home / Self Care | Payer: No Typology Code available for payment source | Source: Ambulatory Visit | Attending: Cardiology | Admitting: Cardiology

## 2022-11-07 DIAGNOSIS — E039 Hypothyroidism, unspecified: Secondary | ICD-10-CM

## 2022-11-07 DIAGNOSIS — Z09 Encounter for follow-up examination after completed treatment for conditions other than malignant neoplasm: Secondary | ICD-10-CM | POA: Diagnosis not present

## 2022-11-07 DIAGNOSIS — I4819 Other persistent atrial fibrillation: Secondary | ICD-10-CM | POA: Diagnosis not present

## 2022-11-07 DIAGNOSIS — I10 Essential (primary) hypertension: Secondary | ICD-10-CM

## 2022-11-07 DIAGNOSIS — Z87891 Personal history of nicotine dependence: Secondary | ICD-10-CM | POA: Diagnosis not present

## 2022-11-07 DIAGNOSIS — I5030 Unspecified diastolic (congestive) heart failure: Secondary | ICD-10-CM | POA: Diagnosis not present

## 2022-11-07 DIAGNOSIS — I110 Hypertensive heart disease with heart failure: Secondary | ICD-10-CM | POA: Diagnosis not present

## 2022-11-07 DIAGNOSIS — I4892 Unspecified atrial flutter: Secondary | ICD-10-CM | POA: Insufficient documentation

## 2022-11-07 DIAGNOSIS — Z7901 Long term (current) use of anticoagulants: Secondary | ICD-10-CM

## 2022-11-07 DIAGNOSIS — Z8673 Personal history of transient ischemic attack (TIA), and cerebral infarction without residual deficits: Secondary | ICD-10-CM | POA: Insufficient documentation

## 2022-11-07 DIAGNOSIS — I4891 Unspecified atrial fibrillation: Secondary | ICD-10-CM

## 2022-11-07 DIAGNOSIS — I428 Other cardiomyopathies: Secondary | ICD-10-CM | POA: Diagnosis not present

## 2022-11-07 DIAGNOSIS — I484 Atypical atrial flutter: Secondary | ICD-10-CM

## 2022-11-07 HISTORY — PX: CARDIOVERSION: SHX1299

## 2022-11-07 LAB — POCT INR: INR: 2.4 (ref 2.0–3.0)

## 2022-11-07 SURGERY — CARDIOVERSION
Anesthesia: General

## 2022-11-07 MED ORDER — SODIUM CHLORIDE 0.9 % IV SOLN: INTRAVENOUS | Status: DC

## 2022-11-07 MED ORDER — PROPOFOL 10 MG/ML IV BOLUS
INTRAVENOUS | Status: DC | PRN
  Administered 2022-11-07: 70 mg via INTRAVENOUS

## 2022-11-07 MED ORDER — LIDOCAINE 2% (20 MG/ML) 5 ML SYRINGE
INTRAMUSCULAR | Status: DC | PRN
  Administered 2022-11-07: 40 mg via INTRAVENOUS

## 2022-11-07 NOTE — CV Procedure (Signed)
Electrical Cardioversion Procedure Note AURTHUR WATER [id number] 1941/06/27  Procedure: Electrical Cardioversion Indications:  Atrial Flutter  Procedure Details Consent: Risks of procedure as well as the alternatives and risks of each were explained to the (patient/caregiver).  Consent for procedure obtained. INR therapeutic for at least 4 weeks. Time Out: Verified patient identification, verified procedure, site/side was marked, verified correct patient position, special equipment/implants available, medications/allergies/relevent history reviewed, required imaging and test results available.  Performed  Patient placed on cardiac monitor, pulse oximetry, supplemental oxygen as necessary.  Sedation given:  Pt sedated by anesthesia with lidocaine 40 mg and diprovan 70 mg IV. Pacer pads placed anterior and posterior chest.  Cardioverted 1 time(s).  Cardioverted at 120J.  Evaluation Findings: Post procedure EKG shows: NSR Complications: None Patient did tolerate procedure well. Continue coumadin.   [name] 11/07/2022, 9:51 AM

## 2022-11-07 NOTE — Patient Instructions (Signed)
Description   Continue taking 1 tablet daily except 1.5 tablets on Sundays and Fridays.  Stay consistent with greens  INR check in 1 week   [facility] Clinic [phone]

## 2022-11-07 NOTE — Discharge Instructions (Addendum)
Electrical Cardioversion Electrical cardioversion is the delivery of a jolt of electricity to restore a normal rhythm to the heart. A rhythm that is too fast or is not regular keeps the heart from pumping well. In this procedure, sticky patches or metal paddles are placed on the chest to deliver electricity to the heart from a device. This procedure may be done in an emergency if: There is low or no blood pressure as a result of the heart rhythm. Normal rhythm must be restored as fast as possible to protect the brain and heart from further damage. It may save a life. This may also be a scheduled procedure for irregular or fast heart rhythms that are not immediately life-threatening.  What can I expect after the procedure? Your blood pressure, heart rate, breathing rate, and blood oxygen level will be monitored until you leave the hospital or clinic. Your heart rhythm will be watched to make sure it does not change. You may have some redness on the skin where the shocks were given. Over the counter cortizone cream may be helpful.  Follow these instructions at home: Do not drive for 24 hours if you were given a sedative during your procedure. Take over-the-counter and prescription medicines only as told by your health care provider. Ask your health care provider how to check your pulse. Check it often. Rest for 48 hours after the procedure or as told by your health care provider. Avoid or limit your caffeine use as told by your health care provider. Keep all follow-up visits as told by your health care provider. This is important. Contact a health care provider if: You feel like your heart is beating too quickly or your pulse is not regular. You have a serious muscle cramp that does not go away. Get help right away if: You have discomfort in your chest. You are dizzy or you feel faint. You have trouble breathing or you are short of breath. Your speech is slurred. You have trouble moving an  arm or leg on one side of your body. Your fingers or toes turn cold or blue. Summary Electrical cardioversion is the delivery of a jolt of electricity to restore a normal rhythm to the heart. This procedure may be done right away in an emergency or may be a scheduled procedure if the condition is not an emergency. Generally, this is a safe procedure. After the procedure, check your pulse often as told by your health care provider. This information is not intended to replace advice given to you by your health care provider. Make sure you discuss any questions you have with your health care provider. Document Revised: 03/24/2019 Document Reviewed: 03/24/2019 Elsevier Patient Education  2020 [facility] [address] [place], [place]  [address] Phone:  [phone]   November 07, 2022  Patient: Allen [name]  Date of Birth: 1941-03-29  Date of Visit: November 07, 2022    To Whom It May Concern:  Allen [name] was seen and treated on November 07, 2022 and    .           If you have any questions or concerns, please don't hesitate to call.   Sincerely,       Treatment Team:  Attending Provider: Lelon Perla, MD

## 2022-11-07 NOTE — Anesthesia Preprocedure Evaluation (Signed)
Anesthesia Evaluation  Patient identified by MRN, date of birth, ID band Patient awake    Reviewed: Allergy & Precautions, NPO status , Patient's Chart, lab work & pertinent test results, reviewed documented beta blocker date and time   History of Anesthesia Complications Negative for: history of anesthetic complications  Airway Mallampati: I  TM Distance: >3 FB Neck ROM: Full    Dental  (+) Dental Advisory Given, Chipped,    Pulmonary former smoker   Pulmonary exam normal        Cardiovascular hypertension, Pt. on medications and Pt. on home beta blockers Normal cardiovascular exam+ dysrhythmias Atrial Fibrillation + pacemaker    '23 Myoperfusion -   The study is normal. The study is low risk.   Left ventricular function is normal. Nuclear stress EF: 56 %. The left ventricular ejection fraction is normal (55-65%). End diastolic cavity size is normal.   Prior study not available for comparison.  '23 TTE - EF 60 to 65%. The left ventricular internal cavity size was mildly dilated. There is mild left ventricular hypertrophy. Grade II diastolic dysfunction (pseudonormalization). Mild mitral valve regurgitation. Aortic valve regurgitation is mild. There is mild dilatation of the ascending aorta, measuring 39 mm.      Neuro/Psych TIA negative psych ROS   GI/Hepatic negative GI ROS, Neg liver ROS,,,  Endo/Other  Hypothyroidism    Renal/GU negative Renal ROS     Musculoskeletal negative musculoskeletal ROS (+)    Abdominal Normal abdominal exam  (+)   Peds  Hematology  On coumadin    Anesthesia Other Findings Conclusion  ILR summary report received. Battery status OK. Normal device function. No new symptom, brady, or pause episodes. There were 5 new AF episodes.  AF burden is 10.1% of the time.  On [place], not always good ventricular rate control, median heart rates are 165-170 bpm.  There were 4 tachy episodes detected.   Sent to triage.   Monthly summary reports and ROV/PRN  Kathy Breach, RN, CCDS, [employer]      The study is normal. The study is low risk.   Left ventricular function is normal. Nuclear stress EF: 56 %. The left ventricular ejection fraction is normal (55-65%). End diastolic cavity size is normal.   Prior study not available for comparison.     Reproductive/Obstetrics                             Anesthesia Physical Anesthesia Plan  ASA: 3  Anesthesia Plan: General   Post-op Pain Management: Minimal or no pain anticipated   Induction: Intravenous  PONV Risk Score and Plan: 2 and Propofol infusion and TIVA  Airway Management Planned: Oral ETT, Natural Airway and Simple Face Mask  Additional Equipment: None  Intra-op Plan:   Post-operative Plan:   Informed Consent: I have reviewed the patients History and Physical, chart, labs and discussed the procedure including the risks, benefits and alternatives for the proposed anesthesia with the patient or authorized representative who has indicated his/her understanding and acceptance.       Plan Discussed with: CRNA  Anesthesia Plan Comments:        Anesthesia Quick Evaluation

## 2022-11-07 NOTE — Interval H&P Note (Signed)
History and Physical Interval Note:  11/07/2022 9:52 AM  Allen [name]  has presented today for surgery, with the diagnosis of AFIB.  The various methods of treatment have been discussed with the patient and family. After consideration of risks, benefits and other options for treatment, the patient has consented to  Procedure(s): CARDIOVERSION (N/A) as a surgical intervention.  The patient's history has been reviewed, patient examined, no change in status, stable for surgery.  I have reviewed the patient's chart and labs.  Questions were answered to the patient's satisfaction.     [name]

## 2022-11-07 NOTE — H&P (Signed)
As below; pt with h/o atrial fibrillation, CVA, AAA, HTN, NICM for cardioversion of atrial flutter; he denies CP or dyspnea. PE chest CTA CV irregular Abd no masses.  ROS noncontibutory  Plan to proceed with DCCV; patient has been compliant with apixaban. [name]    ATRIAL FIB OFFICE VISIT 10/04/2022 [facility] [place], NP Cardiology Atypical atrial flutter [facility]) +2 more Dx Referred by Street, [name], MD Reason for Visit   Additional Documentation  Vitals: BP 164/90 Important    Pulse 79   Ht 6' (1.829 m)   Wt 87.4 kg   BMI 26.12 kg/m   BSA 2.11 m      More Vitals  Flowsheets: Anthropometrics,   NEWS,   MEWS Score,   Method of Visit  Encounter Info: Billing Info,   History,   Allergies,   Detailed Report   All Notes   Progress Notes by [name], NP at 10/04/2022 11:00 AM  Author: [name], NP Author Type: Nurse Practitioner Filed: 10/04/2022  1:34 PM  Note Status: Signed Cosign: Cosign Not Required Date of Service: 10/04/2022 11:00 AM  Editor: [name], NP (Nurse Practitioner)             Expand All Collapse All    Primary Care Physician: Street, [name], MD Referring Physician:Dr. Cheryle Horsfall is a 82 y.o. male with a h/o  significant for atrial fibrillation, CVA, hypertension, SVT post ablation.  He had an abdominal aortic aneurysm and is post EVAR.  He had multiple episodes of atrial fibrillation.  Had ablation several years ago for atrial fibrillation or SVT.  He has palpitations on a daily basis.  He has a Linq monitor that showed episodes of atrial fibrillation and was on Multaq very shortly months ago. He did not feel well on Multaq.  He was seen by Dr. Curt Bears 6/13 and Tikosyn admission was discussed. He is now here now  for Tikosyn admit. He is in Sinus brady at 45 bpm with a qtc interval of 392 ms. He is on warfarin and  has had 5 weeks of therapeutic INR's as of this am. No benadryl use.    Pt is back for one week f/u, 04/27/22, for tikosyn admit.   He had intermittent afib in the hospital on tikosyn. He also had some conversion pauses that were minimized with stopping BB. Dr. Curt Bears felt if he was out of rhythm today then stop tikosyn and for pt to consider amiodarone and/or abaltion. Pt states that the afternoon he went home he had some slurred speech but no other symptoms, he felt was from being overly tired as he did not get to sleep in the hospital. The next am after a good night sleep, his speech was normal.    F/u in afib clinic, 09/20/22. He is in atypical  atrial flutter today, rate controlled. He is s/p ablation one month ago.  He is aware and feels he has been in and out. Review of Linq appears to support that. He also noted high blood pressure for the last 1-2 days and took a lisinopril 10 mg yesterday. No swallowing or groin issues. Overall feels ok.    F/u in afib clinic 10/04/22. He is in rate controlled atrial flutter today. Review of ling shows he is likely persistent now. He does not feel as well as he did around 2 weeks  right after ablation. He brings in BP readings at home and looks like he needs to add back lisinopril 10 mg daily. We discussed cardioversion and he is in agreement to purse but will need 4 therapeutic INR's. Checked yesterday and was sub therapeutic at 1.9.   Today, he denies symptoms of palpitations, chest pain, shortness of breath, orthopnea, PND, lower extremity edema, dizziness, presyncope, syncope, or neurologic sequela. The patient is tolerating medications without difficulties and is otherwise without complaint today.        Past Medical History:  Diagnosis Date   AAA (abdominal aortic aneurysm) ([place])     Atrial fibrillation ([facility])     Cardiomyopathy, nonischemic ([place]) 10/28/2018   CHF (congestive heart failure) ([place])     CVA (cerebral infarction) 1969   Hypertension      Irregular heart beat     Malaise and fatigue 11/05/2019   Murmur 11/05/2019   Palpitations 11/05/2019   Prostatitis     PSVT (paroxysmal supraventricular tachycardia) 05/22/2017    Formatting of this note might be different from the original. Prior ablation 2006   Thyroid disease      Hypothyrodidism   Varicose veins           Past Surgical History:  Procedure Laterality Date   ABDOMINAL AORTIC ANEURYSM REPAIR       ABLATION       ATRIAL FIBRILLATION ABLATION N/A 08/22/2022    Procedure: ATRIAL FIBRILLATION ABLATION;  Surgeon: Constance Haw, MD;  Location: [facility];  Service: Cardiovascular;  Laterality: N/A;   CAROTID ENDARTERECTOMY   November 21, 2006    Left cea   ENDOVENOUS ABLATION SAPHENOUS VEIN W/ LASER Left 11-28-2012    left greater saphenous vein by Curt Jews MD   ENDOVENOUS ABLATION SAPHENOUS VEIN W/ LASER Right 12-26-2012    right gretaer saphenous vein by Curt Jews MD   INGUINAL HERNIA REPAIR        X's  4   INGUINAL HERNIA REPAIR   09-2011    left side   stab phlebectomy Left 03-13-2013    10-20 incisions by Curt Jews MD   TONSILLECTOMY AND ADENOIDECTOMY                Current Outpatient Medications  Medication Sig Dispense Refill   acyclovir (ZOVIRAX) 800 MG tablet Take 400 mg by mouth 2 (two) times daily.       finasteride (PROSCAR) 5 MG tablet Take 5 mg by mouth daily.       levothyroxine (SYNTHROID) 150 MCG tablet Take 150 mcg by mouth daily before breakfast.       lisinopril (ZESTRIL) 10 MG tablet Take 10 mg by mouth as needed.       PSYLLIUM PO 2 tablespoons twice daily       silodosin (RAPAFLO) 8 MG CAPS capsule Take 8 mg by mouth at bedtime.       simvastatin (ZOCOR) 20 MG tablet Take 10 mg by mouth at bedtime.       Sodium Hyaluronate, oral, (HYALURONIC ACID) 100 MG CAPS Take 100 mg by mouth every morning.       tadalafil (CIALIS) 5 MG tablet Take 5 mg by mouth daily.       Tamsulosin HCl (FLOMAX) 0.4 MG CAPS Take 0.4 mg by mouth in the  morning. Reported on 01/04/2016       warfarin (COUMADIN) 4 MG tablet TAKE 1 TO 1 & 1/2 (ONE & ONE-HALF) TABLETS BY MOUTH ONCE  DAILY AS DIRECTED BY COUMADIN CLINIC 120 tablet 0   metoprolol tartrate (LOPRESSOR) 25 MG tablet Take 0.5 tablets (12.5 mg total) by mouth 2 (two) times daily. (Patient not taking: Reported on 10/04/2022) 45 tablet 1    No current facility-administered medications for this encounter.           Allergies  Allergen Reactions   Bactrim [Sulfamethoxazole-Trimethoprim] Hives   Digoxin Other (See Comments)      Rapid heart beat, chest pressure          Social History         Socioeconomic History   Marital status: Divorced      Spouse name: Not on file   Number of children: Not on file   Years of education: Not on file   Highest education level: Not on file  Occupational History   Not on file  Tobacco Use   Smoking status: Former      Types: Cigarettes      Quit date: 09/04/1993      Years since quitting: 29.1      Passive exposure: Past   Smokeless tobacco: Never  Vaping Use   Vaping Use: Never used  Substance and Sexual Activity   Alcohol use: Yes      Alcohol/week: 2.0 - 4.0 standard drinks of alcohol      Types: 2 - 4 Glasses of wine per week   Drug use: No   Sexual activity: Not on file  Other Topics Concern   Not on file  Social History Narrative   Not on file    Social Determinants of Health    Financial Resource Strain: Not on file  Food Insecurity: Not on file  Transportation Needs: Not on file  Physical Activity: Not on file  Stress: Not on file  Social Connections: Not on file  Intimate Partner Violence: Not on file           Family History  Problem Relation Age of Onset   Stroke Father     Angina Mother     Hypertension Sister     Heart disease Sister        ROS- All systems are reviewed and negative except as per the HPI above   Physical Exam:    Vitals:    10/04/22 1041  BP: (!) 164/90  Pulse: 79  Weight: 87.4 kg   Height: 6' (1.829 m)       Wt Readings from Last 3 Encounters:  10/04/22 87.4 kg  09/20/22 89.1 kg  08/22/22 86.2 kg      Labs: Recent Labs       Lab Results  Component Value Date    NA 138 07/25/2022    K 4.6 07/25/2022    CL 103 07/25/2022    CO2 26 07/25/2022    GLUCOSE 98 07/25/2022    BUN 13 07/25/2022    CREATININE 1.01 07/25/2022    CALCIUM 8.7 07/25/2022    MG 1.9 04/21/2022      Recent Labs       Lab Results  Component Value Date    INR 1.9 (A) 10/03/2022      Recent Labs       Lab Results  Component Value Date    CHOL 107 12/26/2021    HDL 43 12/26/2021    LDLCALC 50 12/26/2021    TRIG 64 12/26/2021          GEN- The patient is well appearing, alert and oriented  x 3 today.   Head- normocephalic, atraumatic Eyes-  Sclera clear, conjunctiva pink Ears- hearing intact Oropharynx- clear Neck- supple, no JVP Lymph- no cervical lymphadenopathy Lungs- Clear to ausculation bilaterally, normal work of breathing Heart- regular rate but abnormal rhythm, no murmurs, rubs or gallops, PMI not laterally displaced GI- soft, NT, ND, + BS Extremities- no clubbing, cyanosis, or edema MS- no significant deformity or atrophy Skin- no rash or lesion Psych- euthymic mood, full affect Neuro- strength and sensation are intact   EKG-  Vent. rate 79 BPM PR interval * ms QRS duration 94 ms QT/QTcB 376/431 ms P-R-T axes * 79 73 Atrial flutter with 4:1 A-V conduction Incomplete right bundle branch block Abnormal ECG When compared with ECG of 20-Sep-2022 10:50, PREVIOUS ECG IS PRESENT       Assessment and Plan:  1. Afib/flutter  Tikosyn load was not successful to keep pt in rhythm   He is now s/p an ablation since 08/24/23. EKG shows atrial flutter, review of linq and appears to be now persistent  We discussed DCCV, risk vrs  benefit and he wants to pursue  I will schedule for cardioversion after 4 therapeutic INR's, Abigail in the [place] office notified   Rate controlled aflutter today Rate control meds are not being uses as he has h/o  post conversion pauses worsened on BB    2. CHA2DS2VASc  score of at least 4  Continue warfarin with weekly INR's to prepare for CV Yesterday per pt, he was 1.9   3. HTN   Elevated  Brings in BP logs and  it does show elevated BP's at home Restart lisinopril 10 mg daily instead of using prn     Will scheduled DCCV when 4 therapeutic INR's received    Butch Penny C. Carroll, [facility] [address] [place],  [address] [phone]

## 2022-11-07 NOTE — Transfer of Care (Signed)
Immediate Anesthesia Transfer of Care Note  Patient: Allen [name]  Procedure(s) Performed: CARDIOVERSION  Patient Location: Endoscopy Unit  Anesthesia Type:General  Level of Consciousness: drowsy  Airway & Oxygen Therapy: Patient Spontanous Breathing  Post-op Assessment: Report given to RN and Post -op Vital signs reviewed and stable  Post vital signs: Reviewed and stable  Last Vitals:  Vitals Value Taken Time  BP    Temp    Pulse    Resp    SpO2      Last Pain:  Vitals:   11/07/22 0949  TempSrc: Tympanic  PainSc: 0-No pain         Complications: No notable events documented.

## 2022-11-07 NOTE — Anesthesia Postprocedure Evaluation (Signed)
Anesthesia Post Note  Patient: Allen [name]  Procedure(s) Performed: CARDIOVERSION     Patient location during evaluation: Endoscopy Anesthesia Type: General Level of consciousness: awake Pain management: pain level controlled Vital Signs Assessment: post-procedure vital signs reviewed and stable Respiratory status: spontaneous breathing Cardiovascular status: stable Postop Assessment: no apparent nausea or vomiting Anesthetic complications: no  No notable events documented.  Last Vitals:  Vitals:   11/07/22 0949 11/07/22 1040  BP: (!) 140/80 114/78  Pulse:  75  Resp: (!) 21 15  Temp: (!) 36.3 C (!) 36.2 C  SpO2: 98% 96%    Last Pain:  Vitals:   11/07/22 1040  TempSrc: Tympanic  PainSc: 0-No pain                 John F Taleyah Hillman Jr

## 2022-11-09 ENCOUNTER — Encounter (HOSPITAL_COMMUNITY): Payer: Self-pay | Admitting: Cardiology

## 2022-11-10 ENCOUNTER — Encounter: Payer: Self-pay | Admitting: Cardiology

## 2022-11-13 ENCOUNTER — Ambulatory Visit: Payer: No Typology Code available for payment source

## 2022-11-13 DIAGNOSIS — I4891 Unspecified atrial fibrillation: Secondary | ICD-10-CM | POA: Diagnosis not present

## 2022-11-13 NOTE — H&P (Signed)
Allen Perla, MD Physician Cardiology   H&P    Signed   Date of Service: 11/07/2022  9:47 AM   Signed      Updated H and P  As below; pt with h/o atrial fibrillation, CVA, AAA, HTN, NICM for cardioversion of atrial flutter; he denies CP or dyspnea. PE chest CTA CV irregular Abd no masses.   ROS noncontibutory  Agree with Social history, PMH.   Plan to proceed with DCCV; patient has been compliant with apixaban. Allen [name]       ATRIAL FIB OFFICE VISIT 10/04/2022 Allen [name] at [place], NP Cardiology Atypical atrial flutter [facility]) +2 more Dx Referred by Street, Allen Mt, MD Reason for Visit    Additional Documentation   Vitals: BP 164/90 Important    Pulse 79   Ht 6' (1.829 m)   Wt 87.4 kg   BMI 26.12 kg/m   BSA 2.11 m       More Vitals  Flowsheets: Anthropometrics,   NEWS,   MEWS Score,   Method of Visit  Encounter Info: Billing Info,   History,   Allergies,   Detailed Report    All Notes    Progress Notes by Allen Needs, NP at 10/04/2022 11:00 AM   Author: [name], NP Author Type: Nurse Practitioner Filed: 10/04/2022  1:34 PM  Note Status: Signed Cosign: Cosign Not Required Date of Service: 10/04/2022 11:00 AM  Editor: Allen Needs, NP (Nurse Practitioner)                       Expand All Collapse All    Primary Care Physician: Street, Allen Mt, MD Referring Physician:Dr. Cheryle [name] is a 82 y.o. male with a h/o  significant for atrial fibrillation, CVA, hypertension, SVT post ablation.  He had an abdominal aortic aneurysm and is post EVAR.  He had multiple episodes of atrial fibrillation.  Had ablation several years ago for atrial fibrillation or SVT.  He has palpitations on a daily basis.  He has a Linq monitor that showed episodes of atrial fibrillation and was on Multaq very shortly months ago. He did not feel well on  Multaq.  He was seen by Dr. Curt [name] 6/13 and Tikosyn admission was discussed. He is now here now  for Tikosyn admit. He is in Sinus brady at 45 bpm with a qtc interval of 392 ms. He is on warfarin and has had 5 weeks of therapeutic INR's as of this am. No benadryl use.    Pt is back for one week f/u, 04/27/22, for tikosyn admit.   He had intermittent afib in the hospital on tikosyn. He also had some conversion pauses that were minimized with stopping BB. Dr. Curt [name] felt if he was out of rhythm today then stop tikosyn and for pt to consider amiodarone and/or abaltion. Pt states that the afternoon he went home he had some slurred speech but no other symptoms, he felt was from being overly tired as he did not get to sleep in the hospital. The next am after a good night sleep, his speech was normal.    F/u in afib [name], 09/20/22. He is in atypical  atrial flutter today, rate controlled. He is s/p ablation one month ago.  He is aware and feels he has been in and out. Review of Linq appears to support that. He  also noted high blood pressure for the last 1-2 days and took a lisinopril 10 mg yesterday. No swallowing or groin issues. Overall feels ok.    F/u in afib [name] 10/04/22. He is in rate controlled atrial flutter today. Review of ling shows he is likely persistent now. He does not feel as well as he did around 2 weeks right after ablation. He brings in BP readings at home and looks like he [name] to add back lisinopril 10 mg daily. We discussed cardioversion and he is in agreement to purse but will need 4 therapeutic INR's. Checked yesterday and was sub therapeutic at 1.9.   Today, he denies symptoms of palpitations, chest pain, shortness of breath, orthopnea, PND, lower extremity edema, dizziness, presyncope, syncope, or neurologic sequela. The patient is tolerating medications without difficulties and is otherwise without complaint today.           Past Medical History:  Diagnosis Date   AAA (abdominal  aortic aneurysm) ([place])     Atrial fibrillation (HCC)     Cardiomyopathy, nonischemic ([place]) 10/28/2018   CHF (congestive heart failure) ([place])     CVA (cerebral infarction) 1969   Hypertension     Irregular heart beat     Malaise and fatigue 11/05/2019   Murmur 11/05/2019   Palpitations 11/05/2019   Prostatitis     PSVT (paroxysmal supraventricular tachycardia) 05/22/2017    Formatting of this note might be different from the original. Prior ablation 2006   Thyroid disease      Hypothyrodidism   Varicose veins               Past Surgical History:  Procedure Laterality Date   ABDOMINAL AORTIC ANEURYSM REPAIR       ABLATION       ATRIAL FIBRILLATION ABLATION N/A 08/22/2022    Procedure: ATRIAL FIBRILLATION ABLATION;  Surgeon: Allen Haw, MD;  Location: [place] [facility];  Service: Cardiovascular;  Laterality: N/A;   CAROTID ENDARTERECTOMY   November 21, 2006    Left cea   ENDOVENOUS ABLATION SAPHENOUS VEIN W/ LASER Left 11-28-2012    left greater saphenous vein by Allen Jews MD   ENDOVENOUS ABLATION SAPHENOUS VEIN W/ LASER Right 12-26-2012    right gretaer saphenous vein by Allen Jews MD   INGUINAL HERNIA REPAIR        X's  4   INGUINAL HERNIA REPAIR   09-2011    left side   stab phlebectomy Left 03-13-2013    10-20 incisions by Allen Jews MD   TONSILLECTOMY AND ADENOIDECTOMY                     Current Outpatient Medications  Medication Sig Dispense Refill   acyclovir (ZOVIRAX) 800 MG tablet Take 400 mg by mouth 2 (two) times daily.       finasteride (PROSCAR) 5 MG tablet Take 5 mg by mouth daily.       levothyroxine (SYNTHROID) 150 MCG tablet Take 150 mcg by mouth daily before breakfast.       lisinopril (ZESTRIL) 10 MG tablet Take 10 mg by mouth as needed.       PSYLLIUM PO 2 tablespoons twice daily       silodosin (RAPAFLO) 8 MG CAPS capsule Take 8 mg by mouth at bedtime.       simvastatin (ZOCOR) 20 MG tablet Take 10 mg by mouth at bedtime.       Sodium Hyaluronate,  oral, (HYALURONIC ACID)  100 MG CAPS Take 100 mg by mouth every morning.       tadalafil (CIALIS) 5 MG tablet Take 5 mg by mouth daily.       Tamsulosin HCl (FLOMAX) 0.4 MG CAPS Take 0.4 mg by mouth in the morning. Reported on 01/04/2016       warfarin (COUMADIN) 4 MG tablet TAKE 1 TO 1 & 1/2 (ONE & ONE-HALF) TABLETS BY MOUTH ONCE DAILY AS DIRECTED BY COUMADIN [name] 120 tablet 0   metoprolol tartrate (LOPRESSOR) 25 MG tablet Take 0.5 tablets (12.5 mg total) by mouth 2 (two) times daily. (Patient not taking: Reported on 10/04/2022) 45 tablet 1    No current facility-administered medications for this encounter.               Allergies  Allergen Reactions   Bactrim [Sulfamethoxazole-Trimethoprim] Hives   Digoxin Other (See Comments)      Rapid heart beat, chest pressure          Social History             Socioeconomic History   Marital status: Divorced      Spouse name: Not on file   Number of children: Not on file   Years of education: Not on file   Highest education level: Not on file  Occupational History   Not on file  Tobacco Use   Smoking status: Former      Types: Cigarettes      Quit date: 09/04/1993      Years since quitting: 29.1      Passive exposure: Past   Smokeless tobacco: Never  Vaping Use   Vaping Use: Never used  Substance and Sexual Activity   Alcohol use: Yes      Alcohol/week: 2.0 - 4.0 standard drinks of alcohol      Types: 2 - 4 Glasses of wine per week   Drug use: No   Sexual activity: Not on file  Other Topics Concern   Not on file  Social History Narrative   Not on file    Social Determinants of Health    Financial Resource Strain: Not on file  Food Insecurity: Not on file  Transportation [name]: Not on file  Physical Activity: Not on file  Stress: Not on file  Social Connections: Not on file  Intimate Partner Violence: Not on file               Family History  Problem Relation Age of Onset   Stroke Father     Angina Mother      Hypertension Sister     Heart disease Sister        ROS- All systems are reviewed and negative except as per the HPI above   Physical Exam:      Vitals:    10/04/22 1041  BP: (!) 164/90  Pulse: 79  Weight: 87.4 kg  Height: 6' (1.829 m)         Wt Readings from Last 3 Encounters:  10/04/22 87.4 kg  09/20/22 89.1 kg  08/22/22 86.2 kg      Labs: Recent Labs           Lab Results  Component Value Date    NA 138 07/25/2022    K 4.6 07/25/2022    CL 103 07/25/2022    CO2 26 07/25/2022    GLUCOSE 98 07/25/2022    BUN 13 07/25/2022    CREATININE 1.01 07/25/2022    CALCIUM 8.7  07/25/2022    MG 1.9 04/21/2022      Recent Labs           Lab Results  Component Value Date    INR 1.9 (A) 10/03/2022      Recent Labs           Lab Results  Component Value Date    CHOL 107 12/26/2021    HDL 43 12/26/2021    LDLCALC 50 12/26/2021    TRIG 64 12/26/2021          GEN- The patient is well appearing, alert and oriented x 3 today.   Head- normocephalic, atraumatic Eyes-  Sclera clear, conjunctiva pink Ears- hearing intact Oropharynx- clear Neck- supple, no JVP Lymph- no cervical lymphadenopathy Lungs- Clear to ausculation bilaterally, normal work of breathing Heart- regular rate but abnormal rhythm, no murmurs, rubs or gallops, PMI not laterally displaced GI- soft, NT, ND, + BS Extremities- no clubbing, cyanosis, or edema MS- no significant deformity or atrophy Skin- no rash or lesion Psych- euthymic mood, full affect Neuro- strength and sensation are intact   EKG-  Vent. rate 79 BPM PR interval * ms QRS duration 94 ms QT/QTcB 376/431 ms P-R-T axes * 79 73 Atrial flutter with 4:1 A-V conduction Incomplete right bundle branch block Abnormal ECG When compared with ECG of 20-Sep-2022 10:50, PREVIOUS ECG IS PRESENT       Assessment and Plan:  1. Afib/flutter  Tikosyn load was not successful to keep pt in rhythm   He is now s/p an ablation since 08/24/23.  EKG shows atrial flutter, review of linq and appears to be now persistent  We discussed DCCV, risk vrs  benefit and he wants to pursue  I will schedule for cardioversion after 4 therapeutic INR's, Abigail in the [place] office notified  Rate controlled aflutter today Rate control meds are not being uses as he has h/o  post conversion pauses worsened on BB    2. CHA2DS2VASc  score of at least 4  Continue warfarin with weekly INR's to prepare for CV Yesterday per pt, he was 1.9   3. HTN   Elevated  Brings in BP logs and  it does show elevated BP's at home Restart lisinopril 10 mg daily instead of using prn     Will scheduled DCCV when 4 therapeutic INR's received    Butch Penny C. Carroll, [facility] [address] [place], [place] [address] [phone]

## 2022-11-14 ENCOUNTER — Ambulatory Visit (INDEPENDENT_AMBULATORY_CARE_PROVIDER_SITE_OTHER): Payer: No Typology Code available for payment source

## 2022-11-14 DIAGNOSIS — Z7901 Long term (current) use of anticoagulants: Secondary | ICD-10-CM | POA: Diagnosis not present

## 2022-11-14 DIAGNOSIS — I4891 Unspecified atrial fibrillation: Secondary | ICD-10-CM | POA: Diagnosis not present

## 2022-11-14 LAB — CUP PACEART REMOTE DEVICE CHECK
Date Time Interrogation Session: 20240310230753
Implantable Pulse Generator Implant Date: 20220906

## 2022-11-14 LAB — POCT INR: INR: 2.2 (ref 2.0–3.0)

## 2022-11-14 NOTE — Patient Instructions (Signed)
Description   Continue taking 1 tablet daily except 1.5 tablets on Sundays and Fridays. Stay consistent with greens  INR check in 4 weeks  [facility] [phone]      

## 2022-11-22 NOTE — Progress Notes (Signed)
Remote pacemaker transmission.   

## 2022-11-24 ENCOUNTER — Ambulatory Visit: Payer: No Typology Code available for payment source | Attending: Cardiology | Admitting: Cardiology

## 2022-11-24 ENCOUNTER — Encounter: Payer: Self-pay | Admitting: Cardiology

## 2022-11-24 VITALS — BP 140/86 | HR 56 | Ht 72.0 in | Wt 192.0 lb

## 2022-11-24 DIAGNOSIS — I25119 Atherosclerotic heart disease of native coronary artery with unspecified angina pectoris: Secondary | ICD-10-CM | POA: Diagnosis not present

## 2022-11-24 DIAGNOSIS — D6869 Other thrombophilia: Secondary | ICD-10-CM | POA: Diagnosis not present

## 2022-11-24 DIAGNOSIS — I209 Angina pectoris, unspecified: Secondary | ICD-10-CM

## 2022-11-24 DIAGNOSIS — I10 Essential (primary) hypertension: Secondary | ICD-10-CM

## 2022-11-24 DIAGNOSIS — I4819 Other persistent atrial fibrillation: Secondary | ICD-10-CM | POA: Diagnosis not present

## 2022-11-24 DIAGNOSIS — R911 Solitary pulmonary nodule: Secondary | ICD-10-CM

## 2022-11-24 DIAGNOSIS — Z79899 Other long term (current) drug therapy: Secondary | ICD-10-CM

## 2022-11-24 MED ORDER — APIXABAN 5 MG PO TABS: 5.0000 mg | ORAL_TABLET | Freq: Two times a day (BID) | ORAL | 3 refills | Status: DC

## 2022-11-24 MED ORDER — APIXABAN 5 MG PO TABS: 5.0000 mg | ORAL_TABLET | Freq: Two times a day (BID) | ORAL | 0 refills | Status: DC

## 2022-11-24 NOTE — Progress Notes (Signed)
Electrophysiology Office Note   Date:  11/24/2022   ID:  Allen [name], DOB 1941-03-31, MRN [id number]  PCP:  Street, [name], MD  Cardiologist:  Bettina Gavia Primary Electrophysiologist:  Ernesha Ramone Meredith Leeds, MD    Chief Complaint: AF   History of Present Illness: Allen [name] is a 82 y.o. male who is being seen today for the evaluation of AF at the request of Street, [name], *. Presenting today for electrophysiology evaluation.  History significant for atrial fibrillation, CVA, hypertension, SVT post ablation.  He had an abdominal aortic aneurysm and is post EVAR.  He had multiple episodes of atrial fibrillation.  He had ablation several years ago for atrial fibrillation or SVT.  He has palpitations on a daily basis.  Linq shows episodes of atrial fibrillation and he was started on Multaq.  He is now status post atrial fibrillation ablation 08/22/2022.  He is having more episodes of atrial flutter.  He is now status post cardioversion.  Since his cardioversion he has done well without arrhythmia.  Today, denies symptoms of palpitations,  shortness of breath, orthopnea, PND, lower extremity edema, claudication, dizziness, presyncope, syncope, bleeding, or neurologic sequela. The patient is tolerating medications without difficulties.  He does have episodes of chest pain.  Pain at times occurs when he is exerting himself or when he is quite tired.  He can rest when he has the pain with exertion which improves the discomfort.  He did have a significantly elevated coronary calcium score on his CT.   Past Medical History:  Diagnosis Date   AAA (abdominal aortic aneurysm) ([place])    Atrial fibrillation (HCC)    Cardiomyopathy, nonischemic ([place]) 10/28/2018   CHF (congestive heart failure) ([place])    CVA (cerebral infarction) 1969   Hypertension    Irregular heart beat    Malaise and fatigue 11/05/2019   Murmur 11/05/2019   Palpitations 11/05/2019   Prostatitis    PSVT (paroxysmal  supraventricular tachycardia) 05/22/2017   Formatting of this note might be different from the original. Prior ablation 2006   Thyroid disease    Hypothyrodidism   Varicose veins    Past Surgical History:  Procedure Laterality Date   ABDOMINAL AORTIC ANEURYSM REPAIR     ABLATION     ATRIAL FIBRILLATION ABLATION N/A 08/22/2022   Procedure: ATRIAL FIBRILLATION ABLATION;  Surgeon: Constance Haw, MD;  Location: [place] [facility];  Service: Cardiovascular;  Laterality: N/A;   CARDIOVERSION N/A 11/07/2022   Procedure: CARDIOVERSION;  Surgeon: Lelon Perla, MD;  Location: [facility];  Service: Cardiovascular;  Laterality: N/A;   CAROTID ENDARTERECTOMY  November 21, 2006   Left cea   ENDOVENOUS ABLATION SAPHENOUS VEIN W/ LASER Left 11-28-2012   left greater saphenous vein by Curt Jews MD   ENDOVENOUS ABLATION SAPHENOUS VEIN W/ LASER Right 12-26-2012   right gretaer saphenous vein by Curt Jews MD   INGUINAL HERNIA REPAIR     X's  4   INGUINAL HERNIA REPAIR  09-2011   left side   stab phlebectomy Left 03-13-2013   10-20 incisions by Curt Jews MD   TONSILLECTOMY AND ADENOIDECTOMY       Current Outpatient Medications  Medication Sig Dispense Refill   acyclovir (ZOVIRAX) 800 MG tablet Take 400 mg by mouth 2 (two) times daily. Taking for 1 week then starting Valacyclovir     apixaban (ELIQUIS) 5 MG TABS tablet Take 1 tablet (5 mg total) by mouth 2 (two) times daily. Ramos  tablet 3   carboxymethylcellulose (REFRESH TEARS) 0.5 % SOLN Place 1 drop into both eyes daily as needed (dry eyes).     diphenhydrAMINE (BENADRYL) 25 mg capsule Take 25-50 mg by mouth every 6 (six) hours as needed (Sinus).     finasteride (PROSCAR) 5 MG tablet Take 5 mg by mouth daily.     levothyroxine (SYNTHROID) 150 MCG tablet Take 150 mcg by mouth daily before breakfast.     lisinopril (ZESTRIL) 10 MG tablet Take 10 mg by mouth daily.     PSYLLIUM PO Take 15 mLs by mouth 3 (three) times daily as needed  (Constipation).     simvastatin (ZOCOR) 20 MG tablet Take 10 mg by mouth at bedtime.     Sodium Hyaluronate, oral, (HYALURONIC ACID) 100 MG CAPS Take 100 mg by mouth every morning.     tadalafil (CIALIS) 5 MG tablet Take 5 mg by mouth daily.     Tamsulosin HCl (FLOMAX) 0.4 MG CAPS Take 0.4 mg by mouth in the morning. Reported on 01/04/2016     valACYclovir (VALTREX) 500 MG tablet Take 1 tablet (500 mg total) by mouth 2 (two) times daily. 90 tablet 0   metoprolol tartrate (LOPRESSOR) 25 MG tablet Take 0.5 tablets (12.5 mg total) by mouth 2 (two) times daily. (Patient not taking: Reported on 11/24/2022) 45 tablet 1   No current facility-administered medications for this visit.    Allergies:   Bactrim [sulfamethoxazole-trimethoprim] and Digoxin   Social History:  The patient  reports that he quit smoking about 29 years ago. His smoking use included cigarettes. He has been exposed to tobacco smoke. He has never used smokeless tobacco. He reports current alcohol use of about 2.0 - 4.0 standard drinks of alcohol per week. He reports that he does not use drugs.   Family History:  The patient's family history includes Angina in his mother; Heart disease in his sister; Hypertension in his sister; Stroke in his father.   ROS:  Please see the history of present illness.   Otherwise, review of systems is positive for none.   All other systems are reviewed and negative.   PHYSICAL EXAM: VS:  BP (!) 140/86   Pulse (!) 56   Ht 6' (1.829 m)   Wt 192 lb (87.1 kg)   SpO2 99%   BMI 26.04 kg/m  , BMI Body mass index is 26.04 kg/m. GEN: Well nourished, well developed, in no acute distress  HEENT: normal  Neck: no JVD, carotid bruits, or masses Cardiac: RRR; no murmurs, rubs, or gallops,no edema  Respiratory:  clear to auscultation bilaterally, normal work of breathing GI: soft, nontender, nondistended, + BS MS: no deformity or atrophy  Skin: warm and dry, device site well healed Neuro:  Strength and  sensation are intact Psych: euthymic mood, full affect  EKG:  EKG is ordered today. Personal review of the ekg ordered shows sinus rhythm  Personal review of the device interrogation today. Results in [place]: 12/26/2021: ALT 12; TSH 0.547 04/21/2022: Magnesium 1.9 10/31/2022: BUN 14; Creatinine, Ser 1.02; Hemoglobin 14.2; Platelets 179; Potassium 4.5; Sodium 137    Lipid Panel     Component Value Date/Time   CHOL 107 12/26/2021 0859   TRIG 64 12/26/2021 0859   HDL 43 12/26/2021 0859   CHOLHDL 2.5 12/26/2021 0859   LDLCALC 50 12/26/2021 0859     Wt Readings from Last 3 Encounters:  11/24/22 192 lb (87.1 kg)  11/07/22 187 lb (84.8  kg)  10/04/22 192 lb 9.6 oz (87.4 kg)      Other studies Reviewed: Additional studies/ records that were reviewed today include: Cardiac monitor 03/14/20 personally reviewed  Review of the above records today demonstrates:  Conclusion, rare ventricular ectopy occasional supraventricular ectopy and triggered and diary events associated with APCs and PVCs.    ASSESSMENT AND PLAN:  1.  Paroxysmal atrial fibrillation: Currently on warfarin.  CHA2DS2-VASc of 6.  ILR implanted.  Is status post ablation 08/22/2022.  He has had atrial flutter since his ablation and has not had a cardioversion.  He has remained in sinus rhythm.  Would like to stop warfarin and start Eliquis.  2.  Hypertension: Currently well-controlled  3.  Hyperlipidemia: Continue statin per primary cardiology  4.  Second hypercoagulable state: Currently on warfarin for atrial fibrillation  5.  Coronary artery disease: Significantly elevated calcium score on preablation CT.  Dilpreet Faires check fasting lipids.  He is also having potentially an anginal equivalent.  Shelbi Vaccaro plan for cardiac PET.  6.  Pulmonary nodule: Found on preablation CT.  Maura Braaten refer to pulmonary nodule clinic.  Current medicines are reviewed at length with the patient today.   The patient does not have concerns  regarding his medicines.  The following changes were made today: Stop warfarin, start Eliquis  Labs/ tests ordered today include:  Orders Placed This Encounter  Procedures   NM PET [place]   Lipid Profile   Ambulatory referral to Pulmonology   EKG 12-Lead      Disposition:   FU 3 months  Signed, Clayvon Parlett Meredith Leeds, MD  11/24/2022 11:50 AM     [facility] [address] [place]  [address] [phone] (office) [phone] (fax)

## 2022-11-24 NOTE — Patient Instructions (Addendum)
Medication Instructions:  Your physician has recommended you make the following change in your medication:  STOP Coumadin START Eliquis 5 mg twice daily  *If you need a refill on your cardiac medications before your next appointment, please call your pharmacy*   Lab Work: Your physician recommends that you return for lab work in [place] for [facility] lipid panel  If you have labs (blood work) drawn today and your tests are completely normal, you will receive your results only by: [place] (if you have MyChart) OR A paper copy in the mail If you have any lab test that is abnormal or we need to change your treatment, we will call you to review the results.   Testing/Procedures: You will get a call from pulmonology about scheduling a follow up CT for pulmonary nodules    How to Prepare for Your Cardiac PET/CT Stress Test:  1. Please do not take these medications before your test:   Medications that may interfere with the cardiac pharmacological stress agent (ex. nitrates - including erectile dysfunction medications, isosorbide mononitrate, tamulosin or beta-blockers) the day of the exam. (Erectile dysfunction medication should be held for at least 72 hrs prior to test) Theophylline containing medications for 12 hours. Dipyridamole 48 hours prior to the test. Your remaining medications may be taken with water.  2. Nothing to eat or drink, except water, 3 hours prior to arrival time.   NO caffeine/decaffeinated products, or chocolate 12 hours prior to arrival.  3. NO perfume, cologne or lotion  4. Total time is 1 to 2 hours; you may want to bring reading material for the waiting time.  5. Please report to Radiology at the [facility] Main Entrance 30 minutes early for your test.  [address], [place] [address]  Diabetic Preparation:  Hold oral medications. You may take NPH and Lantus insulin. Do not take Humalog or Humulin R (Regular Insulin) the  day of your test. Check blood sugars prior to leaving the house. If able to eat breakfast prior to 3 hour fasting, you may take all medications, including your insulin, Do not worry if you miss your breakfast dose of insulin - start at your next meal.  IF YOU THINK YOU MAY BE PREGNANT, OR ARE NURSING PLEASE INFORM THE TECHNOLOGIST.  In preparation for your appointment, medication and supplies will be purchased.  Appointment availability is limited, so if you need to cancel or reschedule, please call the Radiology Department at [phone]  24 hours in advance to avoid a cancellation fee of $100.00  What to Expect After you Arrive:  Once you arrive and check in for your appointment, you will be taken to a preparation room within the Radiology Department.  A technologist or Nurse will obtain your medical history, verify that you are correctly prepped for the exam, and explain the procedure.  Afterwards,  an IV will be started in your arm and electrodes will be placed on your skin for EKG monitoring during the stress portion of the exam. Then you will be escorted to the PET/CT scanner.  There, staff will get you positioned on the scanner and obtain a blood pressure and EKG.  During the exam, you will continue to be connected to the EKG and blood pressure machines.  A small, safe amount of a radioactive tracer will be injected in your IV to obtain a series of pictures of your heart along with an injection of a stress agent.    After your  Exam:  It is recommended that you eat a meal and drink a caffeinated beverage to counter act any effects of the stress agent.  Drink plenty of fluids for the remainder of the day and urinate frequently for the first couple of hours after the exam.  Your doctor will inform you of your test results within 7-10 business days.  For questions about your test or how to prepare for your test, please call: Marchia Bond, Cardiac Imaging Nurse Navigator  Gordy Clement,  Cardiac Imaging Nurse Navigator Office: [phone]    Follow-Up: At [facility], you and your health needs are our priority.  As part of our continuing mission to provide you with exceptional heart care, we have created designated Provider Care Teams.  These Care Teams include your primary Cardiologist (physician) and Advanced Practice Providers (APPs -  Physician Assistants and Nurse Practitioners) who all work together to provide you with the care you need, when you need it.  Your next appointment:   3 month(s)  The format for your next appointment:   In Person  Provider:   Allegra Lai, MD{   Thank you for choosing [facility]!!   Trinidad Curet, RN [phone]

## 2022-11-29 LAB — LIPID PANEL
Chol/HDL Ratio: 2.2 ratio (ref 0.0–5.0)
Cholesterol, Total: 101 mg/dL (ref 100–199)
HDL: 46 mg/dL (ref >39)
LDL Chol Calc (NIH): 42 mg/dL (ref 0–99)
Triglycerides: 58 mg/dL (ref 0–149)
VLDL Cholesterol Cal: 13 mg/dL (ref 5–40)

## 2022-11-29 LAB — SPECIMEN STATUS REPORT

## 2022-12-06 ENCOUNTER — Ambulatory Visit (INDEPENDENT_AMBULATORY_CARE_PROVIDER_SITE_OTHER): Payer: No Typology Code available for payment source | Admitting: Emergency Medicine

## 2022-12-06 ENCOUNTER — Encounter: Payer: Self-pay | Admitting: Emergency Medicine

## 2022-12-06 VITALS — BP 126/74 | HR 60 | Temp 98.8°F | Ht 72.0 in | Wt 192.8 lb

## 2022-12-06 DIAGNOSIS — R911 Solitary pulmonary nodule: Secondary | ICD-10-CM | POA: Insufficient documentation

## 2022-12-06 HISTORY — DX: Solitary pulmonary nodule: R91.1

## 2022-12-06 NOTE — Patient Instructions (Addendum)
We reviewed your CT scan of the chest from December. We will plan to repeat your CT scan of the chest in June 2024 to compare with your prior. Follow Dr. Lamonte Sakai in June after your CT so we can review the results together.

## 2022-12-06 NOTE — Progress Notes (Signed)
Subjective:    Patient ID: Allen [name], male    DOB: July 05, 1941, 82 y.o.   MRN: [id number]  HPI 82 year old former smoker (50+ pack years) with hypertension, atrial fibrillation and PSVT post ablation, nonischemic cardiomyopathy, prior stroke, hypothyroidism.  He is referred today for review of cardiac CT that showed newly identified pulmonary nodule. He had a basal cell CA on his face removed He reports no dyspnea unless his arrhythmia is acting up. No cough except for the spring. He does have some allergies, uses an antihistamine prn.  He is not on any bronchodilator therapy  Cardiac morphology CT chest 08/15/2022 reviewed by me, shows no mediastinal or hilar adenopathy.  There is an irregular right lower lobe pulmonary nodule 10 x 4 mm   Review of Systems As per HPI  Past Medical History:  Diagnosis Date   AAA (abdominal aortic aneurysm)    Atrial fibrillation    Cardiomyopathy, nonischemic 10/28/2018   CHF (congestive heart failure)    CVA (cerebral infarction) 1969   Hypertension    Irregular heart beat    Malaise and fatigue 11/05/2019   Murmur 11/05/2019   Palpitations 11/05/2019   Prostatitis    PSVT (paroxysmal supraventricular tachycardia) 05/22/2017   Formatting of this note might be different from the original. Prior ablation 2006   Thyroid disease    Hypothyrodidism   Varicose veins      Family History  Problem Relation Age of Onset   Stroke Father    Angina Mother    Hypertension Sister    Heart disease Sister     No family hx lung CA  Social History   Socioeconomic History   Marital status: Divorced    Spouse name: Not on file   Number of children: Not on file   Years of education: Not on file   Highest education level: Not on file  Occupational History   Not on file  Tobacco Use   Smoking status: Former    Types: Cigarettes    Quit date: 09/04/1993    Years since quitting: 29.2    Passive exposure: Past   Smokeless tobacco: Never  Vaping Use    Vaping Use: Never used  Substance and Sexual Activity   Alcohol use: Yes    Alcohol/week: 2.0 - 4.0 standard drinks of alcohol    Types: 2 - 4 Glasses of wine per week   Drug use: No   Sexual activity: Not on file  Other Topics Concern   Not on file  Social History Narrative   Not on file   Social Determinants of Health   Financial Resource Strain: Not on file  Food Insecurity: Not on file  Transportation Needs: Not on file  Physical Activity: Not on file  Stress: Not on file  Social Connections: Not on file  Intimate Partner Violence: Not on file    He was a Customer service manager, 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 60 tablet 3   carboxymethylcellulose (REFRESH TEARS) 0.5 % SOLN Place 1 drop into both eyes daily as needed (dry eyes).  diphenhydrAMINE (BENADRYL) 25 mg capsule Take 25-50 mg by mouth every 6 (six) hours as needed (Sinus).     finasteride (PROSCAR) 5 MG tablet Take 5 mg by mouth daily.     levothyroxine (SYNTHROID) 150 MCG tablet Take 150 mcg by mouth daily before breakfast.     lisinopril (ZESTRIL) 10 MG tablet Take 10 mg by mouth daily.     PSYLLIUM PO Take 15 mLs by mouth 3 (three) times daily as needed (Constipation).     simvastatin (ZOCOR) 20 MG tablet Take 10 mg by mouth at bedtime.     Sodium Hyaluronate, oral, (HYALURONIC ACID) 100 MG CAPS Take 100 mg by mouth every morning.     tadalafil (CIALIS) 5 MG tablet Take 5 mg by mouth daily.     Tamsulosin HCl (FLOMAX) 0.4 MG CAPS Take 0.4 mg by mouth in the morning. Reported on 01/04/2016     valACYclovir  (VALTREX) 500 MG tablet Take 1 tablet (500 mg total) by mouth 2 (two) times daily. 90 tablet 0   acyclovir (ZOVIRAX) 800 MG tablet Take 400 mg by mouth 2 (two) times daily. Taking for 1 week then starting Valacyclovir     apixaban (ELIQUIS) 5 MG TABS tablet Take 1 tablet (5 mg total) by mouth 2 (two) times daily. 60 tablet 0   metoprolol tartrate (LOPRESSOR) 25 MG tablet Take 0.5 tablets (12.5 mg total) by mouth 2 (two) times daily. (Patient not taking: Reported on 11/24/2022) 45 tablet 1   No facility-administered medications prior to visit.        Objective:   Physical Exam Vitals:   12/06/22 1408  BP: 126/74  Pulse: 60  Temp: 98.8 F (37.1 C)  TempSrc: Oral  SpO2: 98%  Weight: 192 lb 12.8 oz (87.5 kg)  Height: 6' (1.829 m)   Gen: Pleasant, well-nourished, in no distress,  normal affect  ENT: No lesions,  mouth clear,  oropharynx clear, no postnasal drip  Neck: No JVD, no stridor  Lungs: No use of accessory muscles, no crackles or wheezing on normal respiration, no wheeze on forced expiration  Cardiovascular: RRR, heart sounds normal, no murmur or gallops, no peripheral edema  Musculoskeletal: No deformities, no cyanosis or clubbing  Neuro: alert, awake, non focal  Skin: Warm, no lesions or rash      Assessment & Plan:  Pulmonary nodule 10 mm pulmonary nodule in the right lower lobe of unclear significance.  He does have a significant tobacco history.  Discussed the differential diagnosis with him today.  We will plan to repeat his CT at the 53-month mark which would be June.  If stable plan for a full 2 years of surveillance.  I will follow-up with him after the scan   Baltazar Apo, MD, PhD 12/06/2022, 2:40 PM [facility] [phone] or if no answer before 7:00PM call [phone] For any issues after 7:00PM please call eLink [phone]

## 2022-12-06 NOTE — Addendum Note (Signed)
Addended by: Gavin Potters R on: 12/06/2022 02:45 PM   Modules accepted: Orders

## 2022-12-06 NOTE — Assessment & Plan Note (Signed)
10 mm pulmonary nodule in the right lower lobe of unclear significance.  He does have a significant tobacco history.  Discussed the differential diagnosis with him today.  We will plan to repeat his CT at the 46-month mark which would be June.  If stable plan for a full 2 years of surveillance.  I will follow-up with him after the scan

## 2022-12-08 ENCOUNTER — Encounter: Payer: Self-pay | Admitting: Cardiology

## 2022-12-19 ENCOUNTER — Ambulatory Visit (INDEPENDENT_AMBULATORY_CARE_PROVIDER_SITE_OTHER): Payer: No Typology Code available for payment source

## 2022-12-19 ENCOUNTER — Telehealth: Payer: Self-pay | Admitting: Emergency Medicine

## 2022-12-19 DIAGNOSIS — D2239 Melanocytic nevi of other parts of face: Secondary | ICD-10-CM | POA: Diagnosis not present

## 2022-12-19 DIAGNOSIS — I50810 Right heart failure, unspecified: Secondary | ICD-10-CM

## 2022-12-19 DIAGNOSIS — L821 Other seborrheic keratosis: Secondary | ICD-10-CM | POA: Diagnosis not present

## 2022-12-19 DIAGNOSIS — L570 Actinic keratosis: Secondary | ICD-10-CM | POA: Diagnosis not present

## 2022-12-19 DIAGNOSIS — D225 Melanocytic nevi of trunk: Secondary | ICD-10-CM | POA: Diagnosis not present

## 2022-12-19 DIAGNOSIS — L814 Other melanin hyperpigmentation: Secondary | ICD-10-CM | POA: Diagnosis not present

## 2022-12-19 DIAGNOSIS — D485 Neoplasm of uncertain behavior of skin: Secondary | ICD-10-CM | POA: Diagnosis not present

## 2022-12-19 NOTE — Telephone Encounter (Signed)
FYI I have documented this in the order

## 2022-12-19 NOTE — Telephone Encounter (Signed)
PT calling because when he saw Dr. Delton Coombes he was referred for a CT scan.  PT states he is with the [facility] for Cardiac and they have decided to take care of this "in house". So there is no need to sched CT. He thanks [place] for our understanding. He did not indicate if he will continue to see Dr. Delton Coombes for Follow ups.

## 2022-12-19 NOTE — Telephone Encounter (Signed)
FYI

## 2022-12-20 LAB — CUP PACEART REMOTE DEVICE CHECK
Date Time Interrogation Session: 20240412230358
Implantable Pulse Generator Implant Date: 20220906

## 2022-12-20 NOTE — Progress Notes (Signed)
Carelink Summary Report / Loop Recorder 

## 2022-12-25 DIAGNOSIS — K581 Irritable bowel syndrome with constipation: Secondary | ICD-10-CM | POA: Diagnosis not present

## 2022-12-25 DIAGNOSIS — E039 Hypothyroidism, unspecified: Secondary | ICD-10-CM | POA: Diagnosis not present

## 2022-12-25 DIAGNOSIS — I10 Essential (primary) hypertension: Secondary | ICD-10-CM | POA: Diagnosis not present

## 2022-12-25 DIAGNOSIS — I25119 Atherosclerotic heart disease of native coronary artery with unspecified angina pectoris: Secondary | ICD-10-CM | POA: Diagnosis not present

## 2022-12-25 DIAGNOSIS — I480 Paroxysmal atrial fibrillation: Secondary | ICD-10-CM | POA: Diagnosis not present

## 2022-12-25 DIAGNOSIS — D6869 Other thrombophilia: Secondary | ICD-10-CM | POA: Diagnosis not present

## 2023-01-17 DIAGNOSIS — L255 Unspecified contact dermatitis due to plants, except food: Secondary | ICD-10-CM | POA: Diagnosis not present

## 2023-01-17 LAB — CUP PACEART REMOTE DEVICE CHECK
Date Time Interrogation Session: 20240515230447
Implantable Pulse Generator Implant Date: 20220906

## 2023-01-19 ENCOUNTER — Telehealth (HOSPITAL_COMMUNITY): Payer: Self-pay | Admitting: *Deleted

## 2023-01-19 NOTE — Telephone Encounter (Signed)
Reaching out to patient to offer assistance regarding upcoming cardiac imaging study; pt verbalizes understanding of appt date/time, parking situation and where to check in, pre-test NPO status and verified current allergies; name and call back number provided for further questions should they arise  Larie Mathes RN Navigator Cardiac Imaging [facility] [phone] office [phone] cell  Patient aware to avoid caffeine 12 hours prior to his cardiac PET scan. 

## 2023-01-22 ENCOUNTER — Ambulatory Visit: Payer: No Typology Code available for payment source

## 2023-01-22 DIAGNOSIS — I4819 Other persistent atrial fibrillation: Secondary | ICD-10-CM

## 2023-01-22 NOTE — Progress Notes (Signed)
Carelink Summary Report / Loop Recorder 

## 2023-01-22 NOTE — Addendum Note (Signed)
Addended by: Okey Regal on: 01/22/2023 03:18 PM   Modules accepted: Orders

## 2023-01-22 NOTE — Addendum Note (Signed)
Addended by: Baird Lyons on: 01/22/2023 02:46 PM   Modules accepted: Orders

## 2023-01-22 NOTE — Addendum Note (Signed)
Addended by: Baird Lyons on: 01/22/2023 02:44 PM   Modules accepted: Orders

## 2023-01-23 ENCOUNTER — Encounter (HOSPITAL_COMMUNITY)
Admission: RE | Admit: 2023-01-23 | Discharge: 2023-01-23 | Disposition: A | Discharge status: Home / Self Care | Payer: No Typology Code available for payment source | Source: Ambulatory Visit | Attending: Cardiology | Admitting: Cardiology

## 2023-01-23 DIAGNOSIS — I209 Angina pectoris, unspecified: Secondary | ICD-10-CM | POA: Insufficient documentation

## 2023-01-23 DIAGNOSIS — I25119 Atherosclerotic heart disease of native coronary artery with unspecified angina pectoris: Secondary | ICD-10-CM | POA: Insufficient documentation

## 2023-01-23 LAB — NM PET CT CARDIAC PERFUSION MULTI W/ABSOLUTE BLOODFLOW
MBFR: 2.45
Nuc Rest EF: 56 %
Nuc Stress EF: 74 %
Rest MBF: 0.69 ml/g/min
ST Depression (mm): 0 mm
Stress MBF: 1.69 ml/g/min
TID: 0.94

## 2023-01-23 MED ORDER — REGADENOSON 0.4 MG/5ML IV SOLN
0.4000 mg | Freq: Once | INTRAVENOUS | Status: AC
  Administered 2023-01-23: 0.4 mg via INTRAVENOUS

## 2023-01-23 MED ORDER — RUBIDIUM RB82 GENERATOR (RUBYFILL)
22.6100 | PACK | Freq: Once | INTRAVENOUS | Status: AC
  Administered 2023-01-23: 22.61 via INTRAVENOUS

## 2023-01-23 MED ORDER — RUBIDIUM RB82 GENERATOR (RUBYFILL)
22.5900 | PACK | Freq: Once | INTRAVENOUS | Status: AC
  Administered 2023-01-23: 22.59 via INTRAVENOUS

## 2023-01-23 MED ORDER — REGADENOSON 0.4 MG/5ML IV SOLN
INTRAVENOUS | Status: AC
  Filled 2023-01-23: qty 5

## 2023-01-23 NOTE — Progress Notes (Signed)
Tolerated test well 

## 2023-02-02 ENCOUNTER — Telehealth: Payer: Self-pay

## 2023-02-02 NOTE — Telephone Encounter (Signed)
I called patient to remind him of his appointment for Monday and he is requesting a call back from Sherri in regards to a missed call he had from yesterday about some testing. He said he'll be home and able to talk until about 10 this morning. Thank you CB # [phone]

## 2023-02-02 NOTE — Telephone Encounter (Signed)
Pt informed that Dr. Elberta Fortis will go over findings in further detail at his OV on Monday. Pt has questions about the "stress portion" of the test, and that the report sent to him in [place] said that test was stopped but does not give a reason, and this was concerning "they did not finish the sentence", he wanted to know why.   Reminded further discussion on Monday. Patient verbalized understanding and agreeable to plan.

## 2023-02-05 ENCOUNTER — Ambulatory Visit: Payer: No Typology Code available for payment source | Attending: Cardiology | Admitting: Cardiology

## 2023-02-05 ENCOUNTER — Encounter: Payer: Self-pay | Admitting: Cardiology

## 2023-02-05 VITALS — BP 140/82 | HR 60 | Ht 72.0 in | Wt 187.0 lb

## 2023-02-05 DIAGNOSIS — I10 Essential (primary) hypertension: Secondary | ICD-10-CM | POA: Diagnosis not present

## 2023-02-05 DIAGNOSIS — I480 Paroxysmal atrial fibrillation: Secondary | ICD-10-CM

## 2023-02-05 DIAGNOSIS — I2510 Atherosclerotic heart disease of native coronary artery without angina pectoris: Secondary | ICD-10-CM | POA: Diagnosis not present

## 2023-02-05 DIAGNOSIS — E785 Hyperlipidemia, unspecified: Secondary | ICD-10-CM

## 2023-02-05 DIAGNOSIS — D6869 Other thrombophilia: Secondary | ICD-10-CM | POA: Diagnosis not present

## 2023-02-05 NOTE — Patient Instructions (Signed)
Medication Instructions:  Your physician recommends that you continue on your current medications as directed. Please refer to the Current Medication list given to you today.  *If you need a refill on your cardiac medications before your next appointment, please call your pharmacy*  Follow-Up: At [facility], you and your health needs are our priority.  As part of our continuing mission to provide you with exceptional heart care, we have created designated Provider Care Teams.  These Care Teams include your primary Cardiologist (physician) and Advanced Practice Providers (APPs -  Physician Assistants and Nurse Practitioners) who all work together to provide you with the care you need, when you need it.  Your next appointment:   6 month(s)  Provider:   Loman Brooklyn, MD

## 2023-02-05 NOTE — Progress Notes (Signed)
Electrophysiology Office Note   Date:  02/05/2023   ID:  Allen [name], DOB 1940-11-08, MRN [id number]  PCP:  Street, Stephanie Coup, MD  Cardiologist:  Dulce Sellar Primary Electrophysiologist:  Binyomin Brann Jorja Loa, MD    Chief Complaint: AF   History of Present Illness: Allen [name] is a 82 y.o. male who is being seen today for the evaluation of AF at the request of Street, Stephanie Coup, *. Presenting today for electrophysiology evaluation.  History significant for atrial fibrillation, CVA, hypertension, SVT post ablation.  He had an abdominal aortic aneurysm and is post EVAR.  He had multiple episodes of atrial fibrillation.  He had ablation several years ago for atrial fibrillation or SVT.  He has palpitations on a daily basis.  Linq shows episodes of atrial fibrillation and he was started on Multaq.  He is now status post atrial fibrillation ablation 08/22/2022.  He is having more episodes of atrial flutter.  He is now status post cardioversion.  Since his cardioversion he has done well without arrhythmia.  Today, denies symptoms of palpitations,  shortness of breath, orthopnea, PND, lower extremity edema, claudication, dizziness, presyncope, syncope, bleeding, or neurologic sequela. The patient is tolerating medications without difficulties.  He does have episodes of chest pain.  Pain at times occurs when he is exerting himself or when he is quite tired.  He can rest when he has the pain with exertion which improves the discomfort.  He did have a significantly elevated coronary calcium score on his CT.   Past Medical History:  Diagnosis Date   AAA (abdominal aortic aneurysm) (HCC)    Atrial fibrillation (HCC)    Cardiomyopathy, nonischemic (HCC) 10/28/2018   CHF (congestive heart failure) (HCC)    CVA (cerebral infarction) 1969   Hypertension    Irregular heart beat    Malaise and fatigue 11/05/2019   Murmur 11/05/2019   Palpitations 11/05/2019   Prostatitis    PSVT (paroxysmal  supraventricular tachycardia) 05/22/2017   Formatting of this note might be different from the original. Prior ablation 2006   Thyroid disease    Hypothyrodidism   Varicose veins    Past Surgical History:  Procedure Laterality Date   ABDOMINAL AORTIC ANEURYSM REPAIR     ABLATION     ATRIAL FIBRILLATION ABLATION N/A 08/22/2022   Procedure: ATRIAL FIBRILLATION ABLATION;  Surgeon: Regan Lemming, MD;  Location: [facility] INVASIVE CV LAB;  Service: Cardiovascular;  Laterality: N/A;   CARDIOVERSION N/A 11/07/2022   Procedure: CARDIOVERSION;  Surgeon: Lewayne Bunting, MD;  Location: [facility] ENDOSCOPY;  Service: Cardiovascular;  Laterality: N/A;   CAROTID ENDARTERECTOMY  November 21, 2006   Left cea   ENDOVENOUS ABLATION SAPHENOUS VEIN W/ LASER Left 11-28-2012   left greater saphenous vein by Gretta Began MD   ENDOVENOUS ABLATION SAPHENOUS VEIN W/ LASER Right 12-26-2012   right gretaer saphenous vein by Gretta Began MD   INGUINAL HERNIA REPAIR     X's  4   INGUINAL HERNIA REPAIR  09-2011   left side   stab phlebectomy Left 03-13-2013   10-20 incisions by Gretta Began MD   TONSILLECTOMY AND ADENOIDECTOMY       Current Outpatient Medications  Medication Sig Dispense Refill   apixaban (ELIQUIS) 5 MG TABS tablet Take 1 tablet (5 mg total) by mouth 2 (two) times daily. 60 tablet 3   carboxymethylcellulose (REFRESH TEARS) 0.5 % SOLN Place 1 drop into both eyes daily as needed (dry eyes).  diphenhydrAMINE (BENADRYL) 25 mg capsule Take 25-50 mg by mouth every 6 (six) hours as needed (Sinus).     finasteride (PROSCAR) 5 MG tablet Take 5 mg by mouth daily.     levothyroxine (SYNTHROID) 150 MCG tablet Take 150 mcg by mouth daily before breakfast.     lisinopril (ZESTRIL) 10 MG tablet Take 10 mg by mouth daily.     PSYLLIUM PO Take 15 mLs by mouth 3 (three) times daily as needed (Constipation).     simvastatin (ZOCOR) 20 MG tablet Take 10 mg by mouth at bedtime.     Sodium Hyaluronate, oral, (HYALURONIC  ACID) 100 MG CAPS Take 100 mg by mouth every morning.     tadalafil (CIALIS) 5 MG tablet Take 5 mg by mouth daily.     Tamsulosin HCl (FLOMAX) 0.4 MG CAPS Take 0.4 mg by mouth in the morning. Reported on 01/04/2016     valACYclovir (VALTREX) 500 MG tablet Take 1 tablet (500 mg total) by mouth 2 (two) times daily. 90 tablet 0   No current facility-administered medications for this visit.    Allergies:   Bactrim [sulfamethoxazole-trimethoprim] and Digoxin   Social History:  The patient  reports that he quit smoking about 29 years ago. His smoking use included cigarettes. He has been exposed to tobacco smoke. He has never used smokeless tobacco. He reports current alcohol use of about 2.0 - 4.0 standard drinks of alcohol per week. He reports that he does not use drugs.   Family History:  The patient's family history includes Angina in his mother; Heart disease in his sister; Hypertension in his sister; Stroke in his father.   ROS:  Please see the history of present illness.   Otherwise, review of systems is positive for none.   All other systems are reviewed and negative.   PHYSICAL EXAM: VS:  BP (!) 140/82   Pulse 60   Ht 6' (1.829 m)   Wt 187 lb (84.8 kg)   SpO2 98%   BMI 25.36 kg/m  , BMI Body mass index is 25.36 kg/m. GEN: Well nourished, well developed, in no acute distress  HEENT: normal  Neck: no JVD, carotid bruits, or masses Cardiac: RRR; no murmurs, rubs, or gallops,no edema  Respiratory:  clear to auscultation bilaterally, normal work of breathing GI: soft, nontender, nondistended, + BS MS: no deformity or atrophy  Skin: warm and dry, device site well healed Neuro:  Strength and sensation are intact Psych: euthymic mood, full affect  EKG:  EKG is ordered today. Personal review of the ekg ordered shows sinus rhythm  Personal review of the device interrogation today. Results in [facility]   Recent Labs: 04/21/2022: Magnesium 1.9 10/31/2022: BUN 14; Creatinine, Ser 1.02;  Hemoglobin 14.2; Platelets 179; Potassium 4.5; Sodium 137    Lipid Panel     Component Value Date/Time   CHOL 101 11/28/2022 0000   TRIG 58 11/28/2022 0000   HDL 46 11/28/2022 0000   CHOLHDL 2.2 11/28/2022 0000   LDLCALC 42 11/28/2022 0000     Wt Readings from Last 3 Encounters:  02/05/23 187 lb (84.8 kg)  12/06/22 192 lb 12.8 oz (87.5 kg)  11/24/22 192 lb (87.1 kg)      Other studies Reviewed: Additional studies/ records that were reviewed today include: Cardiac monitor 03/14/20 personally reviewed  Review of the above records today demonstrates:  Conclusion, rare ventricular ectopy occasional supraventricular ectopy and triggered and diary events associated with APCs and PVCs.    ASSESSMENT  AND PLAN:  1.  Paroxysmal atrial fibrillation: Currently on Eliquis.  CHA2DS2-VASc of 6.  ILR implanted.  Post ablation 08/22/2022.  He did have atrial flutter and had a cardioversion.  He has remained in sinus rhythm.  2.  Hypertension: Mildly elevated today.  Usually well-controlled  3.  Hyperlipidemia: Continue statin per primary cardiology  4.  Secondary hypercoagulable state: Currently on Eliquis for atrial fibrillation  5.  Coronary artery disease: Significantly elevated coronary calcium on preablation CT.  Coronary PET without obstructive disease.  Continue with current management.  6.  Pulmonary nodule: Found on preablation CT.  Has been referred to pulmonary nodule clinic.  Case discussed with primary cardiology  Current medicines are reviewed at length with the patient today.   The patient does not have concerns regarding his medicines.  The following changes were made today: None  Labs/ tests ordered today include:  No orders of the defined types were placed in this encounter.     Disposition:   FU 6 months  Signed, Aysen Shieh Jorja Loa, MD  02/05/2023 10:43 AM     [facility] [address] [place] [address] [phone]  (office) [phone] (fax)

## 2023-02-16 NOTE — Progress Notes (Signed)
Carelink Summary Report / Loop Recorder 

## 2023-02-21 LAB — CUP PACEART REMOTE DEVICE CHECK
Date Time Interrogation Session: 20240617230158
Implantable Pulse Generator Implant Date: 20220906

## 2023-02-26 ENCOUNTER — Ambulatory Visit: Payer: No Typology Code available for payment source

## 2023-02-26 DIAGNOSIS — I480 Paroxysmal atrial fibrillation: Secondary | ICD-10-CM | POA: Diagnosis not present

## 2023-03-07 ENCOUNTER — Other Ambulatory Visit: Payer: No Typology Code available for payment source

## 2023-03-16 NOTE — Progress Notes (Signed)
Carelink Summary Report / Loop Recorder 

## 2023-03-21 DIAGNOSIS — I25119 Atherosclerotic heart disease of native coronary artery with unspecified angina pectoris: Secondary | ICD-10-CM | POA: Diagnosis not present

## 2023-03-21 DIAGNOSIS — Z0000 Encounter for general adult medical examination without abnormal findings: Secondary | ICD-10-CM | POA: Diagnosis not present

## 2023-03-21 DIAGNOSIS — D6869 Other thrombophilia: Secondary | ICD-10-CM | POA: Diagnosis not present

## 2023-03-21 DIAGNOSIS — K581 Irritable bowel syndrome with constipation: Secondary | ICD-10-CM | POA: Diagnosis not present

## 2023-03-21 DIAGNOSIS — I480 Paroxysmal atrial fibrillation: Secondary | ICD-10-CM | POA: Diagnosis not present

## 2023-03-21 DIAGNOSIS — E785 Hyperlipidemia, unspecified: Secondary | ICD-10-CM | POA: Diagnosis not present

## 2023-03-21 DIAGNOSIS — K2930 Chronic superficial gastritis without bleeding: Secondary | ICD-10-CM | POA: Diagnosis not present

## 2023-04-02 ENCOUNTER — Ambulatory Visit (INDEPENDENT_AMBULATORY_CARE_PROVIDER_SITE_OTHER): Payer: No Typology Code available for payment source

## 2023-04-02 DIAGNOSIS — I480 Paroxysmal atrial fibrillation: Secondary | ICD-10-CM

## 2023-04-18 ENCOUNTER — Telehealth: Payer: Self-pay | Admitting: Cardiology

## 2023-04-18 DIAGNOSIS — I4819 Other persistent atrial fibrillation: Secondary | ICD-10-CM

## 2023-04-18 MED ORDER — APIXABAN 5 MG PO TABS: 5.0000 mg | ORAL_TABLET | Freq: Two times a day (BID) | ORAL | 3 refills | Status: DC

## 2023-04-18 NOTE — Telephone Encounter (Signed)
Patient needs refill on Eliquis sent through [place]- He is requesting 90 day supply- he is down to about a week left. He may need a refill sent to local pharmacy before he can get more- please call patient [phone]

## 2023-04-18 NOTE — Progress Notes (Signed)
Carelink Summary Report / Loop Recorder 

## 2023-04-25 DIAGNOSIS — I483 Typical atrial flutter: Secondary | ICD-10-CM

## 2023-04-25 DIAGNOSIS — N994 Postprocedural pelvic peritoneal adhesions: Secondary | ICD-10-CM

## 2023-04-25 DIAGNOSIS — N400 Enlarged prostate without lower urinary tract symptoms: Secondary | ICD-10-CM

## 2023-04-25 DIAGNOSIS — D3132 Benign neoplasm of left choroid: Secondary | ICD-10-CM

## 2023-04-25 DIAGNOSIS — K2901 Acute gastritis with bleeding: Secondary | ICD-10-CM | POA: Insufficient documentation

## 2023-04-25 DIAGNOSIS — Z23 Encounter for immunization: Secondary | ICD-10-CM

## 2023-04-25 DIAGNOSIS — G459 Transient cerebral ischemic attack, unspecified: Secondary | ICD-10-CM | POA: Insufficient documentation

## 2023-04-25 DIAGNOSIS — F4389 Other reactions to severe stress: Secondary | ICD-10-CM

## 2023-04-25 DIAGNOSIS — A6000 Herpesviral infection of urogenital system, unspecified: Secondary | ICD-10-CM | POA: Insufficient documentation

## 2023-04-25 DIAGNOSIS — C44311 Basal cell carcinoma of skin of nose: Secondary | ICD-10-CM | POA: Insufficient documentation

## 2023-04-25 DIAGNOSIS — E669 Obesity, unspecified: Secondary | ICD-10-CM | POA: Insufficient documentation

## 2023-04-25 HISTORY — DX: Postprocedural pelvic peritoneal adhesions: N99.4

## 2023-04-25 HISTORY — DX: Benign neoplasm of left choroid: D31.32

## 2023-04-25 HISTORY — DX: Encounter for immunization: Z23

## 2023-04-25 HISTORY — DX: Obesity, unspecified: E66.9

## 2023-04-25 HISTORY — DX: Benign prostatic hyperplasia without lower urinary tract symptoms: N40.0

## 2023-04-25 HISTORY — DX: Other reactions to severe stress: F43.89

## 2023-04-25 HISTORY — DX: Basal cell carcinoma of skin of nose: C44.311

## 2023-04-25 HISTORY — DX: Transient cerebral ischemic attack, unspecified: G45.9

## 2023-04-25 HISTORY — DX: Acute gastritis with bleeding: K29.01

## 2023-04-25 HISTORY — DX: Herpesviral infection of urogenital system, unspecified: A60.00

## 2023-04-25 HISTORY — DX: Typical atrial flutter: I48.3

## 2023-04-25 NOTE — Progress Notes (Unsigned)
Cardiology Office Note:    Date:  04/26/2023   ID:  Allen [name], DOB 07/27/1941, MRN [id number]  PCP:  Street, Stephanie Coup, MD  Cardiologist:  Norman Herrlich, MD    Referring MD: [id number], Stephanie Coup, *    ASSESSMENT:    1. Paroxysmal atrial fibrillation (HCC)   2. Secondary hypercoagulable state (HCC)   3. Hypertensive heart disease without heart failure   4. Coronary artery disease involving native coronary artery of native heart without angina pectoris   5. Hyperlipidemia, unspecified hyperlipidemia type    PLAN:    In order of problems listed above:  Clinically has done well after atrial fibrillation ablation maintaining sinus rhythm although he still has palpitation anticoagulated now with Eliquis Hypertension is stable p.o. target continue current treatment ACE inhibitor He is on lipid-lowering therapy recent labs in March with an LDL of 42 He is pending eye surgery YAG right eye has had some diplopia I told him I do not think this is TIA and he will follow-up with his ophthalmology    Next appointment: 6 months   Medication Adjustments/Labs and Tests Ordered: Current medicines are reviewed at length with the patient today.  Concerns regarding medicines are outlined above.  No orders of the defined types were placed in this encounter.  No orders of the defined types were placed in this encounter.    History of Present Illness:    Allen [name] is a 82 y.o. male with a hx of ischemic cardiomyopathy with normalization of ejection fraction paroxysmal atrial fibrillation with EP catheter ablation previous stroke with long-term anticoagulation hypertensive heart disease peripheral arterial disease of the EVAR for abdominal aortic aneurysm and hypothyroidism last seen 06/30/2022.  Compliance with diet, lifestyle and medications: Yes  He reviewed the results of his PET cardiac was concerned it was an adequate study I reviewed with him it was normal I told him he  received the standard protocol He is now on Eliquis He inquires what medications he can take to reverse heart disease I told him there is none on the market right now but what he is taking anticoagulant blood pressure cholesterol changes the course of the illness and Holtz progression There is no indication he requires revascularization Continues to have palpitation but no demonstrable recurrence of atrial fibrillation Past Medical History:  Diagnosis Date   AAA (abdominal aortic aneurysm) (HCC)    Atrial fibrillation (HCC)    Cardiomyopathy, nonischemic (HCC) 10/28/2018   CHF (congestive heart failure) (HCC)    CVA (cerebral infarction) 1969   Hypertension    Irregular heart beat    Malaise and fatigue 11/05/2019   Murmur 11/05/2019   Palpitations 11/05/2019   Prostatitis    PSVT (paroxysmal supraventricular tachycardia) 05/22/2017   Formatting of this note might be different from the original. Prior ablation 2006   Thyroid disease    Hypothyrodidism   Varicose veins     Current Medications: Current Meds  Medication Sig   apixaban (ELIQUIS) 5 MG TABS tablet Take 1 tablet (5 mg total) by mouth 2 (two) times daily.   carboxymethylcellulose (REFRESH TEARS) 0.5 % SOLN Place 1 drop into both eyes daily as needed (dry eyes).   diphenhydrAMINE (BENADRYL) 25 mg capsule Take 25-50 mg by mouth every 6 (six) hours as needed (Sinus).   finasteride (PROSCAR) 5 MG tablet Take 5 mg by mouth daily.   levothyroxine (SYNTHROID) 150 MCG tablet Take 150 mcg by mouth daily before breakfast.   lisinopril (ZESTRIL)  10 MG tablet Take 10 mg by mouth daily.   omeprazole (PRILOSEC) 20 MG capsule Take 20 mg by mouth daily.   PSYLLIUM PO Take 15 mLs by mouth 3 (three) times daily as needed (Constipation).   simvastatin (ZOCOR) 20 MG tablet Take 10 mg by mouth at bedtime.   tadalafil (CIALIS) 5 MG tablet Take 5 mg by mouth daily.   Tamsulosin HCl (FLOMAX) 0.4 MG CAPS Take 0.4 mg by mouth in the morning. Reported on  01/04/2016   valACYclovir (VALTREX) 500 MG tablet Take 1 tablet (500 mg total) by mouth 2 (two) times daily.   [DISCONTINUED] Sodium Hyaluronate, oral, (HYALURONIC ACID) 100 MG CAPS Take 100 mg by mouth every morning.      EKGs/Labs/Other Studies Reviewed:    The following studies were reviewed today:  Cardiac Studies & Procedures     STRESS TESTS  NM PET CT CARDIAC PERFUSION MULTI W/ABSOLUTE BLOODFLOW 01/23/2023  Narrative   The study is normal. The study is low risk.   LV perfusion is normal. There is no evidence of ischemia. There is no evidence of infarction.   Rest left ventricular function is normal. Rest EF: 56 %. Stress left ventricular function is normal. Stress EF: 74 %. End diastolic cavity size is normal. End systolic cavity size is normal.   Myocardial blood flow was computed to be 0.45ml/g/min at rest and 1.41ml/g/min at stress. Global myocardial blood flow reserve was 2.45 and was normal.   Coronary calcium was present on the attenuation correction CT images. Severe coronary calcifications were present. Coronary calcifications were present in the left anterior descending artery, left circumflex artery and right coronary artery distribution(s).   Electronically Signed: Laurance Flatten, MD  CLINICAL DATA:  This over-read does not include interpretation of cardiac or coronary anatomy or pathology. The cardiac PET-CT interpretation by the cardiologist is attached.  COMPARISON:  Coronary CTA 08/15/2022  FINDINGS: Vascular: Normal caliber ascending thoracic aorta. Stable atherosclerotic calcifications. Advanced three-vessel coronary artery calcifications.  Mediastinum/Nodes: No mediastinal or hilar mass or lymphadenopathy. The esophagus is grossly normal.  Lungs/Pleura: No acute pulmonary findings. No worrisome pulmonary lesions. No pleural effusions.  Upper Abdomen: No significant upper abdominal findings.  Musculoskeletal: No significant bony  findings.  IMPRESSION: 1. No mediastinal or hilar mass or adenopathy. 2. No acute pulmonary findings or worrisome pulmonary lesions. 3. Coronary artery calcifications.  Aortic Atherosclerosis (ICD10-I70.0).   Electronically Signed By: Rudie Meyer M.D. On: 01/23/2023 12:46   ECHOCARDIOGRAM  ECHOCARDIOGRAM COMPLETE 07/07/2021  Narrative ECHOCARDIOGRAM REPORT    Patient Name:   Allen [name] Date of Exam: 07/07/2021 Medical Rec #:  [id number]        Height:       72.0 in Accession #:    [id number]       Weight:       196.0 lb Date of Birth:  1941/07/16         BSA:          2.112 m Patient Age:    80 years         BP:           134/70 mmHg Patient Gender: M                HR:           52 bpm. Exam Location:  [place]  Procedure: 2D Echo, Cardiac Doppler, Color Doppler and Strain Analysis  Indications:    Paroxysmal atrial fibrillation (HCC) [I48.0 (ICD-10-CM)]  History:        Patient has no prior history of Echocardiogram examinations. CHF and Cardiomyopathy; Signs/Symptoms:Hypertensive Heart Disease, Murmur and Fatigue.  Sonographer:    Louie Boston RDCS Referring Phys: [id number] Axie Hayne J Antwoine Zorn  IMPRESSIONS   1. GLS -15.6. Left ventricular ejection fraction, by estimation, is 60 to 65%. The left ventricle has normal function. The left ventricle has no regional wall motion abnormalities. The left ventricular internal cavity size was mildly dilated. There is mild left ventricular hypertrophy. Left ventricular diastolic parameters are consistent with Grade II diastolic dysfunction (pseudonormalization). 2. Right ventricular systolic function is normal. The right ventricular size is normal. There is normal pulmonary artery systolic pressure. 3. The mitral valve is normal in structure. Mild mitral valve regurgitation. No evidence of mitral stenosis. 4. The aortic valve is normal in structure. Aortic valve regurgitation is mild. No aortic stenosis is present. 5. There is mild  dilatation of the ascending aorta, measuring 39 mm. 6. The inferior vena cava is normal in size with greater than 50% respiratory variability, suggesting right atrial pressure of 3 mmHg.  FINDINGS Left Ventricle: GLS -15.6. Left ventricular ejection fraction, by estimation, is 60 to 65%. The left ventricle has normal function. The left ventricle has no regional wall motion abnormalities. The left ventricular internal cavity size was mildly dilated. There is mild left ventricular hypertrophy. Left ventricular diastolic parameters are consistent with Grade II diastolic dysfunction (pseudonormalization).  Right Ventricle: The right ventricular size is normal. No increase in right ventricular wall thickness. Right ventricular systolic function is normal. There is normal pulmonary artery systolic pressure. The tricuspid regurgitant velocity is 2.52 m/s, and with an assumed right atrial pressure of 3 mmHg, the estimated right ventricular systolic pressure is 28.4 mmHg.  Left Atrium: Left atrial size was normal in size.  Right Atrium: Right atrial size was normal in size.  Pericardium: There is no evidence of pericardial effusion.  Mitral Valve: The mitral valve is normal in structure. Mild mitral valve regurgitation. No evidence of mitral valve stenosis.  Tricuspid Valve: The tricuspid valve is normal in structure. Tricuspid valve regurgitation is mild . No evidence of tricuspid stenosis.  Aortic Valve: The aortic valve is normal in structure. Aortic valve regurgitation is mild. No aortic stenosis is present.  Pulmonic Valve: The pulmonic valve was normal in structure. Pulmonic valve regurgitation is not visualized. No evidence of pulmonic stenosis.  Aorta: The aortic root is normal in size and structure. There is mild dilatation of the ascending aorta, measuring 39 mm.  Venous: The inferior vena cava is normal in size with greater than 50% respiratory variability, suggesting right atrial  pressure of 3 mmHg.  IAS/Shunts: No atrial level shunt detected by color flow Doppler.   LEFT VENTRICLE PLAX 2D LVIDd:         6.00 cm   Diastology LVIDs:         3.80 cm   LV e' medial:    6.96 cm/s LV PW:         1.20 cm   LV E/e' medial:  8.4 LV IVS:        1.20 cm   LV e' lateral:   10.30 cm/s LVOT diam:     2.30 cm   LV E/e' lateral: 5.7 LV SV:         91 LV SV Index:   43 LVOT Area:     4.15 cm   RIGHT VENTRICLE  IVC RV S prime:     10.20 cm/s  IVC diam: 1.50 cm TAPSE (M-mode): 2.6 cm  LEFT ATRIUM             Index        RIGHT ATRIUM           Index LA Vol (A2C):   90.7 ml 42.94 ml/m  RA Area:     19.90 cm LA Vol (A4C):   65.9 ml 31.20 ml/m  RA Volume:   50.20 ml  23.77 ml/m LA Biplane Vol: 78.8 ml 37.30 ml/m AORTIC VALVE LVOT Vmax:   91.40 cm/s LVOT Vmean:  64.800 cm/s LVOT VTI:    0.220 m  AORTA Ao Root diam: 3.80 cm Ao Asc diam:  3.85 cm Ao Desc diam: 2.20 cm  MITRAL VALVE               TRICUSPID VALVE MV Area (PHT): 2.77 cm    TR Peak grad:   25.4 mmHg MV Decel Time: 274 msec    TR Vmax:        252.00 cm/s MV E velocity: 58.70 cm/s MV A velocity: 48.40 cm/s  SHUNTS MV E/A ratio:  1.21        Systemic VTI:  0.22 m Systemic Diam: 2.30 cm  Gypsy Balsam MD Electronically signed by Gypsy Balsam MD Signature Date/Time: 07/07/2021/5:00:02 PM    Final    MONITORS  LONG TERM MONITOR (3-14 DAYS) 03/11/2020  Narrative A ZIO monitor was performed for 3 days beginning 02/18/2020 to assess paroxysmal atrial fibrillation with palpitation.  The cardiac rhythm throughout was sinus with average heart rate 54 bpm minimum 44 maximum 99 bpm.  There are no pauses of 3 seconds or greater and there were no episodes of second or third-degree AV block or sinus node exit block.  Ventricular ectopy was rare with isolated PVCs.  Supraventricular arrhythmia was occasional APCs.  There were no episodes of atrial fibrillation or flutter.  There were 2  brief runs of atrial premature contractions the longest 6 complexes at a rate of 97 bpm.  There were 4 triggered and 1 diary events.  2 were frequent APCs 1 frequent PVCs and the other to sinus rhythm.   Conclusion, rare ventricular ectopy occasional supraventricular ectopy and triggered and diary events associated with APCs and PVCs.               Recent Labs: 10/31/2022: BUN 14; Creatinine, Ser 1.02; Hemoglobin 14.2; Platelets 179; Potassium 4.5; Sodium 137  Recent Lipid Panel    Component Value Date/Time   CHOL 101 11/28/2022 0000   TRIG 58 11/28/2022 0000   HDL 46 11/28/2022 0000   CHOLHDL 2.2 11/28/2022 0000   LDLCALC 42 11/28/2022 0000    Physical Exam:    VS:  BP 122/76 (BP Location: Right Arm, Patient Position: Sitting)   Pulse 67   Ht 6' (1.829 m)   Wt 186 lb 3.2 oz (84.5 kg)   SpO2 98%   BMI 25.25 kg/m     Wt Readings from Last 3 Encounters:  04/26/23 186 lb 3.2 oz (84.5 kg)  02/05/23 187 lb (84.8 kg)  12/06/22 192 lb 12.8 oz (87.5 kg)     GEN:  Well nourished, well developed in no acute distress HEENT: Normal NECK: No JVD; No carotid bruits LYMPHATICS: No lymphadenopathy CARDIAC: RRR, no murmurs, rubs, gallops RESPIRATORY:  Clear to auscultation without rales, wheezing or rhonchi  ABDOMEN: Soft, non-tender, non-distended MUSCULOSKELETAL:  No  edema; No deformity  SKIN: Warm and dry NEUROLOGIC:  Alert and oriented x 3 PSYCHIATRIC:  Normal affect    Signed, Norman Herrlich, MD  04/26/2023 9:46 AM    [facility]

## 2023-04-26 ENCOUNTER — Encounter: Payer: Self-pay | Admitting: Cardiology

## 2023-04-26 ENCOUNTER — Ambulatory Visit: Payer: No Typology Code available for payment source | Attending: Cardiology | Admitting: Cardiology

## 2023-04-26 VITALS — BP 122/76 | HR 67 | Ht 72.0 in | Wt 186.2 lb

## 2023-04-26 DIAGNOSIS — E039 Hypothyroidism, unspecified: Secondary | ICD-10-CM | POA: Diagnosis not present

## 2023-04-26 DIAGNOSIS — I10 Essential (primary) hypertension: Secondary | ICD-10-CM | POA: Diagnosis not present

## 2023-04-26 DIAGNOSIS — Y708 Miscellaneous anesthesiology devices associated with adverse incidents, not elsewhere classified: Secondary | ICD-10-CM | POA: Diagnosis not present

## 2023-04-26 DIAGNOSIS — K581 Irritable bowel syndrome with constipation: Secondary | ICD-10-CM | POA: Diagnosis not present

## 2023-04-26 DIAGNOSIS — D6489 Other specified anemias: Secondary | ICD-10-CM | POA: Diagnosis not present

## 2023-04-26 DIAGNOSIS — I119 Hypertensive heart disease without heart failure: Secondary | ICD-10-CM | POA: Diagnosis not present

## 2023-04-26 DIAGNOSIS — D6869 Other thrombophilia: Secondary | ICD-10-CM

## 2023-04-26 DIAGNOSIS — E785 Hyperlipidemia, unspecified: Secondary | ICD-10-CM | POA: Diagnosis not present

## 2023-04-26 DIAGNOSIS — I480 Paroxysmal atrial fibrillation: Secondary | ICD-10-CM | POA: Diagnosis not present

## 2023-04-26 DIAGNOSIS — I2510 Atherosclerotic heart disease of native coronary artery without angina pectoris: Secondary | ICD-10-CM

## 2023-04-26 DIAGNOSIS — Z0000 Encounter for general adult medical examination without abnormal findings: Secondary | ICD-10-CM | POA: Diagnosis not present

## 2023-04-26 DIAGNOSIS — I25119 Atherosclerotic heart disease of native coronary artery with unspecified angina pectoris: Secondary | ICD-10-CM | POA: Diagnosis not present

## 2023-04-26 DIAGNOSIS — I672 Cerebral atherosclerosis: Secondary | ICD-10-CM | POA: Diagnosis not present

## 2023-04-26 NOTE — Patient Instructions (Signed)

## 2023-04-27 DIAGNOSIS — R7301 Impaired fasting glucose: Secondary | ICD-10-CM | POA: Diagnosis not present

## 2023-04-27 DIAGNOSIS — D6489 Other specified anemias: Secondary | ICD-10-CM | POA: Diagnosis not present

## 2023-04-27 DIAGNOSIS — E039 Hypothyroidism, unspecified: Secondary | ICD-10-CM | POA: Diagnosis not present

## 2023-04-27 DIAGNOSIS — E785 Hyperlipidemia, unspecified: Secondary | ICD-10-CM | POA: Diagnosis not present

## 2023-04-27 DIAGNOSIS — Z79899 Other long term (current) drug therapy: Secondary | ICD-10-CM | POA: Diagnosis not present

## 2023-05-08 ENCOUNTER — Ambulatory Visit (INDEPENDENT_AMBULATORY_CARE_PROVIDER_SITE_OTHER): Payer: No Typology Code available for payment source

## 2023-05-08 DIAGNOSIS — I480 Paroxysmal atrial fibrillation: Secondary | ICD-10-CM

## 2023-05-08 LAB — CUP PACEART REMOTE DEVICE CHECK
Date Time Interrogation Session: 20240830230417
Implantable Pulse Generator Implant Date: 20220906

## 2023-05-10 ENCOUNTER — Encounter: Payer: Self-pay | Admitting: Cardiology

## 2023-05-16 NOTE — Progress Notes (Signed)
Carelink Summary Report / Loop Recorder 

## 2023-05-18 DIAGNOSIS — S61511A Laceration without foreign body of right wrist, initial encounter: Secondary | ICD-10-CM | POA: Diagnosis not present

## 2023-05-20 DIAGNOSIS — S60221A Contusion of right hand, initial encounter: Secondary | ICD-10-CM | POA: Diagnosis not present

## 2023-05-20 DIAGNOSIS — M79641 Pain in right hand: Secondary | ICD-10-CM | POA: Diagnosis not present

## 2023-06-11 ENCOUNTER — Ambulatory Visit (INDEPENDENT_AMBULATORY_CARE_PROVIDER_SITE_OTHER): Payer: No Typology Code available for payment source

## 2023-06-11 DIAGNOSIS — I480 Paroxysmal atrial fibrillation: Secondary | ICD-10-CM

## 2023-06-11 LAB — CUP PACEART REMOTE DEVICE CHECK
Date Time Interrogation Session: 20241006230059
Implantable Pulse Generator Implant Date: 20220906

## 2023-06-20 ENCOUNTER — Telehealth: Payer: Self-pay | Admitting: Cardiology

## 2023-06-20 NOTE — Telephone Encounter (Signed)
Calling to get patient notes from 10/7 remote check. Fax [phone] Please advise

## 2023-06-21 NOTE — Telephone Encounter (Signed)
Patient is requesting medical records. Thank you.

## 2023-06-25 NOTE — Progress Notes (Signed)
Carelink Summary Report / Loop Recorder 

## 2023-06-28 DIAGNOSIS — Z7902 Long term (current) use of antithrombotics/antiplatelets: Secondary | ICD-10-CM | POA: Diagnosis not present

## 2023-06-28 DIAGNOSIS — Z23 Encounter for immunization: Secondary | ICD-10-CM | POA: Diagnosis not present

## 2023-06-28 DIAGNOSIS — K5900 Constipation, unspecified: Secondary | ICD-10-CM | POA: Diagnosis not present

## 2023-07-16 ENCOUNTER — Ambulatory Visit (INDEPENDENT_AMBULATORY_CARE_PROVIDER_SITE_OTHER): Payer: No Typology Code available for payment source

## 2023-07-16 DIAGNOSIS — I480 Paroxysmal atrial fibrillation: Secondary | ICD-10-CM | POA: Diagnosis not present

## 2023-07-17 LAB — CUP PACEART REMOTE DEVICE CHECK
Date Time Interrogation Session: 20241110230749
Implantable Pulse Generator Implant Date: 20220906

## 2023-08-06 ENCOUNTER — Other Ambulatory Visit: Payer: Self-pay

## 2023-08-06 ENCOUNTER — Telehealth: Payer: Self-pay | Admitting: Cardiology

## 2023-08-06 DIAGNOSIS — I4819 Other persistent atrial fibrillation: Secondary | ICD-10-CM

## 2023-08-06 MED ORDER — APIXABAN 5 MG PO TABS
5.0000 mg | ORAL_TABLET | Freq: Two times a day (BID) | ORAL | 3 refills | Status: AC
Start: 2023-08-06 — End: ?

## 2023-08-06 NOTE — Telephone Encounter (Signed)
Called patient and informed him that his Eliquis medication had been re-filled and sent to the [facility] clinical pharmacy. Patient was appreciative for the call and had no further questions at this time.

## 2023-08-06 NOTE — Telephone Encounter (Signed)
*  STAT* If patient is at the pharmacy, call can be transferred to refill team.   1. Which medications need to be refilled? (please list name of each medication and dose if known)  apixaban (ELIQUIS) 5 MG TABS tablet  2. Which pharmacy/location (including street and city if local pharmacy) is medication to be sent to? [facility] - [place], [place] - [address] Phone: [phone]  Fax: [phone]     3. Do they need a 30 day or 90 day supply? 90

## 2023-08-09 NOTE — Progress Notes (Signed)
Carelink Summary Report / Loop Recorder 

## 2023-08-13 DIAGNOSIS — K5900 Constipation, unspecified: Secondary | ICD-10-CM | POA: Diagnosis not present

## 2023-08-13 DIAGNOSIS — R143 Flatulence: Secondary | ICD-10-CM | POA: Diagnosis not present

## 2023-08-20 ENCOUNTER — Ambulatory Visit (INDEPENDENT_AMBULATORY_CARE_PROVIDER_SITE_OTHER): Payer: No Typology Code available for payment source

## 2023-08-20 DIAGNOSIS — I4819 Other persistent atrial fibrillation: Secondary | ICD-10-CM

## 2023-08-20 LAB — CUP PACEART REMOTE DEVICE CHECK
Date Time Interrogation Session: 20241215230519
Implantable Pulse Generator Implant Date: 20220906

## 2023-09-10 ENCOUNTER — Encounter: Payer: Self-pay | Admitting: Cardiology

## 2023-09-10 ENCOUNTER — Ambulatory Visit: Payer: No Typology Code available for payment source | Attending: Cardiology | Admitting: Cardiology

## 2023-09-10 VITALS — BP 126/70 | HR 59 | Ht 72.0 in | Wt 194.0 lb

## 2023-09-10 DIAGNOSIS — I10 Essential (primary) hypertension: Secondary | ICD-10-CM | POA: Diagnosis not present

## 2023-09-10 DIAGNOSIS — I480 Paroxysmal atrial fibrillation: Secondary | ICD-10-CM | POA: Diagnosis not present

## 2023-09-10 DIAGNOSIS — D6869 Other thrombophilia: Secondary | ICD-10-CM

## 2023-09-10 DIAGNOSIS — R55 Syncope and collapse: Secondary | ICD-10-CM

## 2023-09-10 NOTE — Progress Notes (Signed)
   Electrophysiology Office Note:   Date:  09/10/2023  ID:  Allen [name], DOB September 23, 1940, MRN [id number]  Primary Cardiologist: Redell Leiter, MD Primary Heart Failure: None Electrophysiologist: Creedon Danielski Gladis Norton, MD      History of Present Illness:   Allen [name] is a 83 y.o. male with h/o atrial fibrillation, CVA, hypertension, SVT, abdominal aortic aneurysm post EVAR seen today for routine electrophysiology followup.   Since last being seen in our clinic the patient reports doing overall well.  He does continue to have intermittent palpitations that are short-lived and infrequent.  Aside from that, he has no complaints other than some fatigue.  He does walk every day.  He is no longer able to work in the yard for multiple hours.  He can work in the yard for 2 hours at a time.  he denies chest pain, palpitations, dyspnea, PND, orthopnea, nausea, vomiting, dizziness, syncope, edema, weight gain, or early satiety.   Review of systems complete and found to be negative unless listed in HPI.   Device History: Medtronic loop recorder implanted 05/10/21 for syncope  EP Information / Studies Reviewed:    EKG is ordered today. Personal review as below.  EKG Interpretation Date/Time:  Monday September 10 2023 10:37:21 EST Ventricular Rate:  59 PR Interval:  178 QRS Duration:  86 QT Interval:  406 QTC Calculation: 401 R Axis:   74  Text Interpretation: Sinus bradycardia When compared with ECG of 07-Nov-2022 11:02, No significant change since last tracing Confirmed by Arno Cullers ([id number]) on 09/10/2023 10:42:39 AM     Risk Assessment/Calculations:    CHA2DS2-VASc Score = 6   This indicates a 9.7% annual risk of stroke. The patient's score is based upon: CHF History: 0 HTN History: 1 Diabetes History: 0 Stroke History: 2 Vascular Disease History: 1 Age Score: 2 Gender Score: 0             Physical Exam:   VS:  BP 126/70   Pulse (!) 59   Ht 6' (1.829 m)   Wt 194 lb (88  kg)   SpO2 98%   BMI 26.31 kg/m    Wt Readings from Last 3 Encounters:  09/10/23 194 lb (88 kg)  04/26/23 186 lb 3.2 oz (84.5 kg)  02/05/23 187 lb (84.8 kg)     GEN: Well nourished, well developed in no acute distress NECK: No JVD; No carotid bruits CARDIAC: Regular rate and rhythm, no murmurs, rubs, gallops RESPIRATORY:  Clear to auscultation without rales, wheezing or rhonchi  ABDOMEN: Soft, non-tender, non-distended EXTREMITIES:  No edema; No deformity   ILR Interrogation- reviewed in detail today,  See PACEART report  ASSESSMENT AND PLAN:    Syncope s/p Medtronic Loop recorder Normal device function See Pace Art report No changes today  2.  Paroxysmal atrial fibrillation: Post ablation 08/22/2022.  Has had continued episodes of atrial flutter requiring cardioversion.  No further arrhythmias.  0% burden noted on cardiac monitor.  No changes.  3.  Secondary hypercoagulable state: Currently on Eliquis  for atrial fibrillation  4.  Coronary artery disease: Nonobstructive on PET scan.  As calcium 1 preablation CT.  Plan per primary cardiology.  5.  Hypertension: Currently well-controlled     Follow up with Dr. Norton in 12 months  Signed, Winona Sison Gladis Norton, MD

## 2023-09-24 NOTE — Progress Notes (Signed)
Carelink Summary Report / Loop Recorder 

## 2023-10-09 DIAGNOSIS — K219 Gastro-esophageal reflux disease without esophagitis: Secondary | ICD-10-CM | POA: Diagnosis not present

## 2023-10-09 DIAGNOSIS — R194 Change in bowel habit: Secondary | ICD-10-CM | POA: Diagnosis not present

## 2023-10-09 DIAGNOSIS — K5900 Constipation, unspecified: Secondary | ICD-10-CM | POA: Diagnosis not present

## 2023-10-11 ENCOUNTER — Telehealth: Payer: Self-pay

## 2023-10-11 NOTE — Telephone Encounter (Signed)
   [facility] Pre-operative Risk Assessment    Request for surgical clearance:  What type of surgery is being performed? Colonoscopy/EGD   When is this surgery scheduled? March 12th    What type of clearance is required (medical clearance vs. Pharmacy clearance to hold med vs. Both)? Both   Are there any medications that need to be held prior to surgery and how long? Eliquis  on March 10th   Practice name and name of physician performing surgery? Dr. Evalene Rattler at [facility]     What is your office phone number [phone]    7.   What is your office fax number [phone]  8.   Anesthesia type (None, local, MAC, general) ? Not indicated    Tyonna Talerico C Nesta Scaturro 10/11/2023, 4:38 PM  _________________________________________________________________   (provider comments below)

## 2023-10-12 NOTE — Telephone Encounter (Signed)
 Patient with diagnosis of afib on Eliquis  for anticoagulation.    Procedure: Colonoscopy/EGD  Date of procedure: 11/14/23   CHA2DS2-VASc Score = 6   This indicates a 9.7% annual risk of stroke. The patient's score is based upon: CHF History: 0 HTN History: 1 Diabetes History: 0 Stroke History: 2 Vascular Disease History: 1 Age Score: 2 Gender Score: 0    CrCl 80 mL/min Platelet count 179 (10/11/22)    Per office protocol, patient can hold Eliquis  for 2 days prior to procedure.     **This guidance is not considered finalized until pre-operative APP has relayed final recommendations.**

## 2023-10-12 NOTE — Telephone Encounter (Signed)
 Allen [name] 83 year old male is requesting preoperative cardiac evaluation for colonoscopy/EGD.  Procedure is scheduled for 3/12.  He was recently seen in clinic by you on 09/10/2023.  Would you be able to provide recommendations for cardiac risk for his upcoming procedure.  Thank you for your help.  Please direct your response to CV DIV preop pool.  Josefa HERO. Katriana Dortch NP-C     10/12/2023, 3:54 PM [facility] [address] Office [phone] Fax [phone]

## 2023-10-16 NOTE — Telephone Encounter (Signed)
   Primary Cardiologist: Norman Herrlich, MD  Chart reviewed as part of pre-operative protocol coverage. Given past medical history and time since last visit, based on ACC/AHA guidelines, BLAYKE CORDREY would be at acceptable risk for the planned procedure without further cardiovascular testing.    Per office protocol, patient can hold Eliquis for 2 days prior to procedure.    I will route this recommendation to the requesting party via Epic fax function and remove from pre-op pool.  Please call with questions.  Levi Aland, NP-C  10/16/2023, 2:29 PM [address] Office [phone] Fax [phone]

## 2023-10-18 DIAGNOSIS — M6289 Other specified disorders of muscle: Secondary | ICD-10-CM | POA: Diagnosis not present

## 2023-10-18 DIAGNOSIS — I480 Paroxysmal atrial fibrillation: Secondary | ICD-10-CM | POA: Diagnosis not present

## 2023-10-18 DIAGNOSIS — I25119 Atherosclerotic heart disease of native coronary artery with unspecified angina pectoris: Secondary | ICD-10-CM | POA: Diagnosis not present

## 2023-10-18 DIAGNOSIS — K581 Irritable bowel syndrome with constipation: Secondary | ICD-10-CM | POA: Diagnosis not present

## 2023-10-18 DIAGNOSIS — D6869 Other thrombophilia: Secondary | ICD-10-CM | POA: Diagnosis not present

## 2023-10-25 ENCOUNTER — Encounter: Payer: Self-pay | Admitting: Cardiology

## 2023-10-25 DIAGNOSIS — I259 Chronic ischemic heart disease, unspecified: Secondary | ICD-10-CM | POA: Insufficient documentation

## 2023-10-25 DIAGNOSIS — H26491 Other secondary cataract, right eye: Secondary | ICD-10-CM

## 2023-10-25 HISTORY — DX: Chronic ischemic heart disease, unspecified: I25.9

## 2023-10-25 HISTORY — DX: Other secondary cataract, right eye: H26.491

## 2023-10-26 NOTE — Progress Notes (Signed)
 Cardiology Office Note:    Date:  10/30/2023   ID:  Allen [name], DOB Sep 05, 1940, MRN [id number]  PCP:  Street, Stephanie Coup, MD  Cardiologist:  Norman Herrlich, MD    Referring MD: [address], Stephanie Coup, *    ASSESSMENT:    1. Paroxysmal atrial fibrillation (HCC)   2. Chronic anticoagulation   3. Hypertensive heart disease without heart failure   4. Coronary artery disease involving native coronary artery of native heart without angina pectoris   5. Mixed hyperlipidemia    PLAN:    In order of problems listed above:  Allen [name] is doing well maintaining sinus rhythm after his EP catheter ablation and implanted loop recorderNo longer on a beta-blocker because of resting bradycardia Continue his current anticoagulant he has instructions for holding it periprocedural colonoscopy Well-controlled BP at target continue his ACE inhibitor recheck labs today creatinine and potassium Stable CAD having no anginal discomfort Continue statin we will check a lipid profile today long with CMP and TSH with complaints of fatigue   Next appointment: 9 months   Medication Adjustments/Labs and Tests Ordered: Current medicines are reviewed at length with the patient today.  Concerns regarding medicines are outlined above.  No orders of the defined types were placed in this encounter.  No orders of the defined types were placed in this encounter.    History of Present Illness:    Allen [name] is a 83 y.o. male with a hx of paroxysmal atrial fibrillation maintaining sinus rhythm after EP catheter ablation chronic anticoagulation with history of previous stroke hypertensive heart disease without heart failure coronary artery disease abdominal aortic aneurysm with EVAR hypothyroidism and hyperlipidemia last seen 04/26/2023.  Will continue to follow his loop recorder in the office he has had no arrhythmic episodes or new episodes of atrial fibrillation.  Compliance with diet, lifestyle and  medications: Yes  His primary concern is he feels his heart rate is not adequate for physical activity and it is holding him back in no longer takes a beta-blocker For edema treadmill stress test he declined I encouraged him to get a smart watch to self monitor his heart rhythm and push himself get back into golf and activities He is not having palpitations syncope chest pain edema or shortness of breath. He tolerates his anticoagulant without any bleeding complication He tolerates his statin without muscle pain or weakness We will recheck labs today Past Medical History:  Diagnosis Date   AAA (abdominal aortic aneurysm) (HCC)    Acute hemorrhagic gastritis 04/25/2023   Feb 08, 2016 Entered By: Crisoforo Oxford Comment: [facility] egd: acute gastritis w bleeding.     Aneurysm of abdominal vessel (HCC) 08/20/2012   IMO SNOMED Dx Update Oct 2024     Atrial fibrillation [facility])    Atrial flutter (HCC) 11/05/2019   Atypical chest pain 10/01/2017   Basal cell carcinoma of nasolabial groove 04/25/2023   Nov 15, 2016 Entered By: Crisoforo Oxford Comment: Private dermatology.     Benign neoplasm of left choroid 04/25/2023   Benign prostatic hyperplasia without lower urinary tract symptoms 04/25/2023   Cardiomyopathy, nonischemic (HCC) 10/28/2018   Chest discomfort 10/28/2018   CHF (congestive heart failure) (HCC)    Chronic anticoagulation 02/12/2020   Chronic ischemic heart disease, unspecified 10/25/2023   CVA (cerebral infarction) 1969   DOE (dyspnea on exertion) 10/01/2017   Dyslipidemia 10/01/2017   Encounter for immunization 04/25/2023   Genital herpes 04/25/2023   Groin pain, right 06/28/2017  Hypertension    Hypertensive heart disease 02/12/2020   Irregular heart beat    Lower abdominal pain 08/23/2021   Malaise and fatigue 11/05/2019   Murmur 11/05/2019   Obesity 04/25/2023   Other reactions to severe stress 04/25/2023   Other secondary cataract, right eye 10/25/2023    Palpitations 11/05/2019   Paroxysmal atrial fibrillation (HCC) 04/18/2022   Periumbilical pain 05/16/2016   Postprocedural pelvic peritoneal adhesions 04/25/2023   Prostatitis    PSVT (paroxysmal supraventricular tachycardia) (HCC) 05/22/2017   Formatting of this note might be different from the original. Prior ablation 2006   Pulmonary nodule 12/06/2022   Supraumbilical hernia 05/16/2016   Thyroid disease    Hypothyrodidism   Transient ischemic attack 04/25/2023   Typical atrial flutter (HCC) 04/25/2023   Varicose veins    Varicose veins of bilateral lower extremities with other complications 11/19/2012   IMO SNOMED Dx Update Oct 2024      Current Medications: Current Meds  Medication Sig   apixaban (ELIQUIS) 5 MG TABS tablet Take 1 tablet (5 mg total) by mouth 2 (two) times daily.   carboxymethylcellulose (REFRESH TEARS) 0.5 % SOLN Place 1 drop into both eyes daily as needed (dry eyes).   diphenhydrAMINE (BENADRYL) 25 mg capsule Take 25-50 mg by mouth every 6 (six) hours as needed (Sinus).   finasteride (PROSCAR) 5 MG tablet Take 5 mg by mouth daily.   levothyroxine (SYNTHROID) 150 MCG tablet Take 150 mcg by mouth daily before breakfast.   lisinopril (ZESTRIL) 10 MG tablet Take 5 mg by mouth daily.   omeprazole (PRILOSEC) 20 MG capsule Take 20 mg by mouth daily.   polyethylene glycol (MIRALAX / GLYCOLAX) 17 g packet Take 17 g by mouth daily.   PSYLLIUM PO Take 15 mLs by mouth 3 (three) times daily as needed (Constipation).   simvastatin (ZOCOR) 20 MG tablet Take 10 mg by mouth at bedtime.   tadalafil (CIALIS) 5 MG tablet Take 5 mg by mouth daily.   Tamsulosin HCl (FLOMAX) 0.4 MG CAPS Take 0.4 mg by mouth in the morning. Reported on 01/04/2016   valACYclovir (VALTREX) 500 MG tablet Take 1 tablet (500 mg total) by mouth 2 (two) times daily.      EKGs/Labs/Other Studies Reviewed:    The following studies were reviewed today:  Cardiac Studies & Procedures    ______________________________________________________________________________________________   STRESS TESTS  NM PET CT CARDIAC PERFUSION MULTI W/ABSOLUTE BLOODFLOW 01/23/2023  Narrative   The study is normal. The study is low risk.   LV perfusion is normal. There is no evidence of ischemia. There is no evidence of infarction.   Rest left ventricular function is normal. Rest EF: 56 %. Stress left ventricular function is normal. Stress EF: 74 %. End diastolic cavity size is normal. End systolic cavity size is normal.   Myocardial blood flow was computed to be 0.4ml/g/min at rest and 1.32ml/g/min at stress. Global myocardial blood flow reserve was 2.45 and was normal.   Coronary calcium was present on the attenuation correction CT images. Severe coronary calcifications were present. Coronary calcifications were present in the left anterior descending artery, left circumflex artery and right coronary artery distribution(s).   Electronically Signed: Laurance Flatten, MD  CLINICAL DATA:  This over-read does not include interpretation of cardiac or coronary anatomy or pathology. The cardiac PET-CT interpretation by the cardiologist is attached.  COMPARISON:  Coronary CTA 08/15/2022  FINDINGS: Vascular: Normal caliber ascending thoracic aorta. Stable atherosclerotic calcifications. Advanced three-vessel coronary artery calcifications.  Mediastinum/Nodes: No mediastinal or hilar mass or lymphadenopathy. The esophagus is grossly normal.  Lungs/Pleura: No acute pulmonary findings. No worrisome pulmonary lesions. No pleural effusions.  Upper Abdomen: No significant upper abdominal findings.  Musculoskeletal: No significant bony findings.  IMPRESSION: 1. No mediastinal or hilar mass or adenopathy. 2. No acute pulmonary findings or worrisome pulmonary lesions. 3. Coronary artery calcifications.  Aortic Atherosclerosis (ICD10-I70.0).   Electronically Signed By: Rudie Meyer  M.D. On: 01/23/2023 12:46   ECHOCARDIOGRAM  ECHOCARDIOGRAM COMPLETE 07/07/2021  Narrative ECHOCARDIOGRAM REPORT    Patient Name:   KEVIN SPACE Date of Exam: 07/07/2021 Medical Rec #:  [id number]        Height:       72.0 in Accession #:    [id number]       Weight:       196.0 lb Date of Birth:  [birth year]         BSA:          2.112 m Patient Age:    80 years         BP:           134/70 mmHg Patient Gender: M                HR:           52 bpm. Exam Location:  [facility]  Procedure: 2D Echo, Cardiac Doppler, Color Doppler and Strain Analysis  Indications:    Paroxysmal atrial fibrillation (HCC) [I48.0 (ICD-10-CM)]  History:        Patient has no prior history of Echocardiogram examinations. CHF and Cardiomyopathy; Signs/Symptoms:Hypertensive Heart Disease, Murmur and Fatigue.  Sonographer:    Louie Boston RDCS Referring Phys: [id number] Andrey Mccaskill J Rickelle Sylvestre  IMPRESSIONS   1. GLS -15.6. Left ventricular ejection fraction, by estimation, is 60 to 65%. The left ventricle has normal function. The left ventricle has no regional wall motion abnormalities. The left ventricular internal cavity size was mildly dilated. There is mild left ventricular hypertrophy. Left ventricular diastolic parameters are consistent with Grade II diastolic dysfunction (pseudonormalization). 2. Right ventricular systolic function is normal. The right ventricular size is normal. There is normal pulmonary artery systolic pressure. 3. The mitral valve is normal in structure. Mild mitral valve regurgitation. No evidence of mitral stenosis. 4. The aortic valve is normal in structure. Aortic valve regurgitation is mild. No aortic stenosis is present. 5. There is mild dilatation of the ascending aorta, measuring 39 mm. 6. The inferior vena cava is normal in size with greater than 50% respiratory variability, suggesting right atrial pressure of 3 mmHg.  FINDINGS Left Ventricle: GLS -15.6. Left ventricular ejection  fraction, by estimation, is 60 to 65%. The left ventricle has normal function. The left ventricle has no regional wall motion abnormalities. The left ventricular internal cavity size was mildly dilated. There is mild left ventricular hypertrophy. Left ventricular diastolic parameters are consistent with Grade II diastolic dysfunction (pseudonormalization).  Right Ventricle: The right ventricular size is normal. No increase in right ventricular wall thickness. Right ventricular systolic function is normal. There is normal pulmonary artery systolic pressure. The tricuspid regurgitant velocity is 2.52 m/s, and with an assumed right atrial pressure of 3 mmHg, the estimated right ventricular systolic pressure is 28.4 mmHg.  Left Atrium: Left atrial size was normal in size.  Right Atrium: Right atrial size was normal in size.  Pericardium: There is no evidence of pericardial effusion.  Mitral Valve: The mitral valve is normal in structure. Mild mitral  valve regurgitation. No evidence of mitral valve stenosis.  Tricuspid Valve: The tricuspid valve is normal in structure. Tricuspid valve regurgitation is mild . No evidence of tricuspid stenosis.  Aortic Valve: The aortic valve is normal in structure. Aortic valve regurgitation is mild. No aortic stenosis is present.  Pulmonic Valve: The pulmonic valve was normal in structure. Pulmonic valve regurgitation is not visualized. No evidence of pulmonic stenosis.  Aorta: The aortic root is normal in size and structure. There is mild dilatation of the ascending aorta, measuring 39 mm.  Venous: The inferior vena cava is normal in size with greater than 50% respiratory variability, suggesting right atrial pressure of 3 mmHg.  IAS/Shunts: No atrial level shunt detected by color flow Doppler.   LEFT VENTRICLE PLAX 2D LVIDd:         6.00 cm   Diastology LVIDs:         3.80 cm   LV e' medial:    6.96 cm/s LV PW:         1.20 cm   LV E/e' medial:  8.4 LV  IVS:        1.20 cm   LV e' lateral:   10.30 cm/s LVOT diam:     2.30 cm   LV E/e' lateral: 5.7 LV SV:         91 LV SV Index:   43 LVOT Area:     4.15 cm   RIGHT VENTRICLE             IVC RV S prime:     10.20 cm/s  IVC diam: 1.50 cm TAPSE (M-mode): 2.6 cm  LEFT ATRIUM             Index        RIGHT ATRIUM           Index LA Vol (A2C):   90.7 ml 42.94 ml/m  RA Area:     19.90 cm LA Vol (A4C):   65.9 ml 31.20 ml/m  RA Volume:   50.20 ml  23.77 ml/m LA Biplane Vol: 78.8 ml 37.30 ml/m AORTIC VALVE LVOT Vmax:   91.40 cm/s LVOT Vmean:  64.800 cm/s LVOT VTI:    0.220 m  AORTA Ao Root diam: 3.80 cm Ao Asc diam:  3.85 cm Ao Desc diam: 2.20 cm  MITRAL VALVE               TRICUSPID VALVE MV Area (PHT): 2.77 cm    TR Peak grad:   25.4 mmHg MV Decel Time: 274 msec    TR Vmax:        252.00 cm/s MV E velocity: 58.70 cm/s MV A velocity: 48.40 cm/s  SHUNTS MV E/A ratio:  1.21        Systemic VTI:  0.22 m Systemic Diam: 2.30 cm  Gypsy Balsam MD Electronically signed by Gypsy Balsam MD Signature Date/Time: 07/07/2021/5:00:02 PM    Final    MONITORS  LONG TERM MONITOR (3-14 DAYS) 03/11/2020  Narrative A ZIO monitor was performed for 3 days beginning 02/18/2020 to assess paroxysmal atrial fibrillation with palpitation.  The cardiac rhythm throughout was sinus with average heart rate 54 bpm minimum 44 maximum 99 bpm.  There are no pauses of 3 seconds or greater and there were no episodes of second or third-degree AV block or sinus node exit block.  Ventricular ectopy was rare with isolated PVCs.  Supraventricular arrhythmia was occasional APCs.  There were no episodes of atrial fibrillation or  flutter.  There were 2 brief runs of atrial premature contractions the longest 6 complexes at a rate of 97 bpm.  There were 4 triggered and 1 diary events.  2 were frequent APCs 1 frequent PVCs and the other to sinus rhythm.   Conclusion, rare ventricular ectopy occasional  supraventricular ectopy and triggered and diary events associated with APCs and PVCs.       ______________________________________________________________________________________________          Recent Labs: 10/31/2022: BUN 14; Creatinine, Ser 1.02; Hemoglobin 14.2; Platelets 179; Potassium 4.5; Sodium 137  Recent Lipid Panel    Component Value Date/Time   CHOL 101 11/28/2022 0000   TRIG 58 11/28/2022 0000   HDL 46 11/28/2022 0000   CHOLHDL 2.2 11/28/2022 0000   LDLCALC 42 11/28/2022 0000    Physical Exam:    VS:  BP 138/70   Pulse 64   Ht 6' (1.829 m)   Wt 196 lb 3.2 oz (89 kg)   SpO2 98%   BMI 26.61 kg/m     Wt Readings from Last 3 Encounters:  10/30/23 196 lb 3.2 oz (89 kg)  09/10/23 194 lb (88 kg)  04/26/23 186 lb 3.2 oz (84.5 kg)     GEN:  Well nourished, well developed in no acute distress HEENT: Normal NECK: No JVD; No carotid bruits LYMPHATICS: No lymphadenopathy CARDIAC: RRR, no murmurs, rubs, gallops RESPIRATORY:  Clear to auscultation without rales, wheezing or rhonchi  ABDOMEN: Soft, non-tender, non-distended MUSCULOSKELETAL:  No edema; No deformity  SKIN: Warm and dry NEUROLOGIC:  Alert and oriented x 3 PSYCHIATRIC:  Normal affect    Signed, Norman Herrlich, MD  10/30/2023 8:44 AM    [facility]

## 2023-10-29 ENCOUNTER — Ambulatory Visit (INDEPENDENT_AMBULATORY_CARE_PROVIDER_SITE_OTHER): Payer: No Typology Code available for payment source

## 2023-10-29 DIAGNOSIS — I480 Paroxysmal atrial fibrillation: Secondary | ICD-10-CM | POA: Diagnosis not present

## 2023-10-30 ENCOUNTER — Ambulatory Visit: Payer: No Typology Code available for payment source | Attending: Cardiology | Admitting: Cardiology

## 2023-10-30 ENCOUNTER — Encounter: Payer: Self-pay | Admitting: Cardiology

## 2023-10-30 VITALS — BP 138/70 | HR 64 | Ht 72.0 in | Wt 196.2 lb

## 2023-10-30 DIAGNOSIS — I480 Paroxysmal atrial fibrillation: Secondary | ICD-10-CM

## 2023-10-30 DIAGNOSIS — I2510 Atherosclerotic heart disease of native coronary artery without angina pectoris: Secondary | ICD-10-CM | POA: Diagnosis not present

## 2023-10-30 DIAGNOSIS — E782 Mixed hyperlipidemia: Secondary | ICD-10-CM

## 2023-10-30 DIAGNOSIS — I119 Hypertensive heart disease without heart failure: Secondary | ICD-10-CM | POA: Diagnosis not present

## 2023-10-30 DIAGNOSIS — Z7901 Long term (current) use of anticoagulants: Secondary | ICD-10-CM

## 2023-10-30 LAB — CUP PACEART REMOTE DEVICE CHECK
Date Time Interrogation Session: 20250223230620
Implantable Pulse Generator Implant Date: 20220906

## 2023-10-30 NOTE — Patient Instructions (Signed)
 Medication Instructions:  Your physician recommends that you continue on your current medications as directed. Please refer to the Current Medication list given to you today.  *If you need a refill on your cardiac medications before your next appointment, please call your pharmacy*   Lab Work: Your physician recommends that you return for lab work in:   Labs today: CMP, Lipids, CBC, TSH  If you have labs (blood work) drawn today and your tests are completely normal, you will receive your results only by: MyChart Message (if you have MyChart) OR A paper copy in the mail If you have any lab test that is abnormal or we need to change your treatment, we will call you to review the results.   Testing/Procedures: None   Follow-Up: At [facility], you and your health needs are our priority.  As part of our continuing mission to provide you with exceptional heart care, we have created designated Provider Care Teams.  These Care Teams include your primary Cardiologist (physician) and Advanced Practice Providers (APPs -  Physician Assistants and Nurse Practitioners) who all work together to provide you with the care you need, when you need it.  We recommend signing up for the patient portal called "MyChart".  Sign up information is provided on this After Visit Summary.  MyChart is used to connect with patients for Virtual Visits (Telemedicine).  Patients are able to view lab/test results, encounter notes, upcoming appointments, etc.  Non-urgent messages can be sent to your provider as well.   To learn more about what you can do with MyChart, go to [email or web address].    Your next appointment:   9 month(s)  Provider:   Norman Herrlich, MD  Other Instructions GI will tell him when to resume his Eliquis after endoscopy

## 2023-10-31 LAB — COMPREHENSIVE METABOLIC PANEL
ALT: 10 IU/L (ref 0–44)
AST: 16 IU/L (ref 0–40)
Albumin: 4.2 g/dL (ref 3.7–4.7)
Alkaline Phosphatase: 77 IU/L (ref 44–121)
BUN/Creatinine Ratio: 11 (ref 10–24)
BUN: 12 mg/dL (ref 8–27)
Bilirubin Total: 0.5 mg/dL (ref 0.0–1.2)
CO2: 23 mmol/L (ref 20–29)
Calcium: 8.9 mg/dL (ref 8.6–10.2)
Chloride: 100 mmol/L (ref 96–106)
Creatinine, Ser: 1.09 mg/dL (ref 0.76–1.27)
Globulin, Total: 2.6 g/dL (ref 1.5–4.5)
Glucose: 97 mg/dL (ref 70–99)
Potassium: 5.3 mmol/L — ABNORMAL HIGH (ref 3.5–5.2)
Sodium: 136 mmol/L (ref 134–144)
Total Protein: 6.8 g/dL (ref 6.0–8.5)
eGFR: 68 mL/min/{1.73_m2} (ref >59)

## 2023-10-31 LAB — CBC
Hematocrit: 39.4 % (ref 37.5–51.0)
Hemoglobin: 12.8 g/dL — ABNORMAL LOW (ref 13.0–17.7)
MCH: 31 pg (ref 26.6–33.0)
MCHC: 32.5 g/dL (ref 31.5–35.7)
MCV: 95 fL (ref 79–97)
Platelets: 200 10*3/uL (ref 150–450)
RBC: 4.13 x10E6/uL — ABNORMAL LOW (ref 4.14–5.80)
RDW: 12 % (ref 11.6–15.4)
WBC: 4.5 10*3/uL (ref 3.4–10.8)

## 2023-10-31 LAB — LIPID PANEL
Chol/HDL Ratio: 2.5 ratio (ref 0.0–5.0)
Cholesterol, Total: 111 mg/dL (ref 100–199)
HDL: 44 mg/dL (ref >39)
LDL Chol Calc (NIH): 54 mg/dL (ref 0–99)
Triglycerides: 55 mg/dL (ref 0–149)
VLDL Cholesterol Cal: 13 mg/dL (ref 5–40)

## 2023-10-31 LAB — TSH: TSH: 1.76 u[IU]/mL (ref 0.450–4.500)

## 2023-11-14 DIAGNOSIS — K219 Gastro-esophageal reflux disease without esophagitis: Secondary | ICD-10-CM | POA: Diagnosis not present

## 2023-11-14 DIAGNOSIS — I4891 Unspecified atrial fibrillation: Secondary | ICD-10-CM | POA: Diagnosis not present

## 2023-11-14 DIAGNOSIS — R194 Change in bowel habit: Secondary | ICD-10-CM | POA: Diagnosis not present

## 2023-11-14 DIAGNOSIS — Z09 Encounter for follow-up examination after completed treatment for conditions other than malignant neoplasm: Secondary | ICD-10-CM | POA: Diagnosis not present

## 2023-11-14 DIAGNOSIS — K2970 Gastritis, unspecified, without bleeding: Secondary | ICD-10-CM | POA: Diagnosis not present

## 2023-11-14 DIAGNOSIS — K5730 Diverticulosis of large intestine without perforation or abscess without bleeding: Secondary | ICD-10-CM | POA: Diagnosis not present

## 2023-11-14 DIAGNOSIS — K449 Diaphragmatic hernia without obstruction or gangrene: Secondary | ICD-10-CM | POA: Diagnosis not present

## 2023-11-14 DIAGNOSIS — K5900 Constipation, unspecified: Secondary | ICD-10-CM | POA: Diagnosis not present

## 2023-11-14 DIAGNOSIS — Z8711 Personal history of peptic ulcer disease: Secondary | ICD-10-CM | POA: Diagnosis not present

## 2023-11-14 DIAGNOSIS — Z860100 Personal history of colon polyps, unspecified: Secondary | ICD-10-CM | POA: Diagnosis not present

## 2023-11-14 DIAGNOSIS — Z7901 Long term (current) use of anticoagulants: Secondary | ICD-10-CM | POA: Diagnosis not present

## 2023-11-28 ENCOUNTER — Encounter: Payer: Self-pay | Admitting: Emergency Medicine

## 2023-12-03 ENCOUNTER — Ambulatory Visit: Payer: No Typology Code available for payment source

## 2023-12-03 DIAGNOSIS — I480 Paroxysmal atrial fibrillation: Secondary | ICD-10-CM

## 2023-12-04 LAB — CUP PACEART REMOTE DEVICE CHECK
Date Time Interrogation Session: 20250330230650
Implantable Pulse Generator Implant Date: 20220906

## 2023-12-04 NOTE — Progress Notes (Signed)
 Carelink Summary Report / Loop Recorder

## 2023-12-04 NOTE — Addendum Note (Signed)
 Addended by: Geralyn Flash D on: 12/04/2023 02:33 PM   Modules accepted: Orders

## 2023-12-17 ENCOUNTER — Telehealth: Payer: Self-pay | Admitting: Cardiology

## 2023-12-17 NOTE — Telephone Encounter (Signed)
 Spoke with pt who would like to know why his cardiac PET was stopped abruptly last May. Advised I will reach out to staff who do the test to see if I can find the answer.

## 2023-12-17 NOTE — Telephone Encounter (Signed)
 Patient wants a call back to discuss results of his NM PET CT CARDIAC PERFUSION MULTI W/ABSOLUTE BLOODFLOW test.

## 2023-12-17 NOTE — Telephone Encounter (Signed)
 left vm to return call

## 2023-12-18 NOTE — Telephone Encounter (Signed)
 Spoke with pt and advised that the test was completed in its entirety. Area where it states that test was stopped ... Was a drop down that was not completed by the tech. Reassured him that he was given all the medication and that everything was okay with his test. Pt verbalized understanding and had no additional questions.

## 2023-12-18 NOTE — Telephone Encounter (Signed)
 Left vm to return call. Unable to get an answer as to why test was stopped.

## 2023-12-20 DIAGNOSIS — L814 Other melanin hyperpigmentation: Secondary | ICD-10-CM | POA: Diagnosis not present

## 2023-12-20 DIAGNOSIS — D485 Neoplasm of uncertain behavior of skin: Secondary | ICD-10-CM | POA: Diagnosis not present

## 2023-12-20 DIAGNOSIS — D2239 Melanocytic nevi of other parts of face: Secondary | ICD-10-CM | POA: Diagnosis not present

## 2023-12-20 DIAGNOSIS — L570 Actinic keratosis: Secondary | ICD-10-CM | POA: Diagnosis not present

## 2023-12-27 DIAGNOSIS — Z8719 Personal history of other diseases of the digestive system: Secondary | ICD-10-CM | POA: Diagnosis not present

## 2023-12-27 DIAGNOSIS — K581 Irritable bowel syndrome with constipation: Secondary | ICD-10-CM | POA: Diagnosis not present

## 2023-12-27 DIAGNOSIS — M6289 Other specified disorders of muscle: Secondary | ICD-10-CM | POA: Diagnosis not present

## 2023-12-27 DIAGNOSIS — Z9889 Other specified postprocedural states: Secondary | ICD-10-CM | POA: Diagnosis not present

## 2024-01-07 ENCOUNTER — Ambulatory Visit (INDEPENDENT_AMBULATORY_CARE_PROVIDER_SITE_OTHER): Payer: No Typology Code available for payment source

## 2024-01-07 DIAGNOSIS — I480 Paroxysmal atrial fibrillation: Secondary | ICD-10-CM | POA: Diagnosis not present

## 2024-01-07 LAB — CUP PACEART REMOTE DEVICE CHECK
Date Time Interrogation Session: 20250504231314
Implantable Pulse Generator Implant Date: 20220906

## 2024-01-16 NOTE — Addendum Note (Signed)
 Addended by: Edra Govern D on: 01/16/2024 01:28 PM   Modules accepted: Orders

## 2024-01-16 NOTE — Progress Notes (Signed)
 Carelink Summary Report / Loop Recorder

## 2024-02-07 ENCOUNTER — Ambulatory Visit (INDEPENDENT_AMBULATORY_CARE_PROVIDER_SITE_OTHER)

## 2024-02-07 DIAGNOSIS — R55 Syncope and collapse: Secondary | ICD-10-CM | POA: Diagnosis not present

## 2024-02-07 LAB — CUP PACEART REMOTE DEVICE CHECK
Date Time Interrogation Session: 20250604231237
Implantable Pulse Generator Implant Date: 20220906

## 2024-02-18 ENCOUNTER — Ambulatory Visit: Payer: Self-pay | Admitting: Cardiology

## 2024-02-25 NOTE — Progress Notes (Signed)
 Carelink Summary Report / Loop Recorder

## 2024-03-10 ENCOUNTER — Ambulatory Visit

## 2024-03-11 DIAGNOSIS — D485 Neoplasm of uncertain behavior of skin: Secondary | ICD-10-CM | POA: Diagnosis not present

## 2024-03-11 DIAGNOSIS — L820 Inflamed seborrheic keratosis: Secondary | ICD-10-CM | POA: Diagnosis not present

## 2024-03-11 DIAGNOSIS — L728 Other follicular cysts of the skin and subcutaneous tissue: Secondary | ICD-10-CM | POA: Diagnosis not present

## 2024-03-13 LAB — CUP PACEART REMOTE DEVICE CHECK
Date Time Interrogation Session: 20250710105708
Implantable Pulse Generator Implant Date: 20220906

## 2024-03-21 ENCOUNTER — Encounter: Payer: Self-pay | Admitting: Advanced Practice Midwife

## 2024-03-25 NOTE — Progress Notes (Signed)
 Carelink Summary Report / Loop Recorder

## 2024-03-27 ENCOUNTER — Telehealth: Payer: Self-pay | Admitting: Cardiology

## 2024-03-27 NOTE — Telephone Encounter (Signed)
 Pt has been released in carelink. [facility] [place].

## 2024-03-27 NOTE — Telephone Encounter (Signed)
 Just FYI patient cancelled remote monitoring/EP follow ups with [facility]- Patient states that the [facility] is now doing this in house. He also would like to communicate this has nothing to do with us - he truly enjoys Dr. Inocencio and Dr. Monetta and being seen at the [facility] office, he just has to do what they ask. If things should change he will call us  and is also keeping his appt with Dr. Monetta in November. Please call patient with any questions- [phone].  Thank you

## 2024-04-05 ENCOUNTER — Ambulatory Visit: Payer: Self-pay | Admitting: Cardiology

## 2024-04-06 DIAGNOSIS — J019 Acute sinusitis, unspecified: Secondary | ICD-10-CM | POA: Diagnosis not present

## 2024-04-06 DIAGNOSIS — G4489 Other headache syndrome: Secondary | ICD-10-CM | POA: Diagnosis not present

## 2024-04-06 DIAGNOSIS — J029 Acute pharyngitis, unspecified: Secondary | ICD-10-CM | POA: Diagnosis not present

## 2024-04-06 DIAGNOSIS — B9689 Other specified bacterial agents as the cause of diseases classified elsewhere: Secondary | ICD-10-CM | POA: Diagnosis not present

## 2024-04-06 DIAGNOSIS — R519 Headache, unspecified: Secondary | ICD-10-CM | POA: Diagnosis not present

## 2024-04-10 ENCOUNTER — Encounter

## 2024-04-29 DIAGNOSIS — I480 Paroxysmal atrial fibrillation: Secondary | ICD-10-CM | POA: Diagnosis not present

## 2024-04-29 DIAGNOSIS — Z131 Encounter for screening for diabetes mellitus: Secondary | ICD-10-CM | POA: Diagnosis not present

## 2024-04-29 DIAGNOSIS — D6489 Other specified anemias: Secondary | ICD-10-CM | POA: Diagnosis not present

## 2024-04-29 DIAGNOSIS — I10 Essential (primary) hypertension: Secondary | ICD-10-CM | POA: Diagnosis not present

## 2024-04-29 DIAGNOSIS — D6869 Other thrombophilia: Secondary | ICD-10-CM | POA: Diagnosis not present

## 2024-04-29 DIAGNOSIS — Z9181 History of falling: Secondary | ICD-10-CM | POA: Diagnosis not present

## 2024-04-29 DIAGNOSIS — E039 Hypothyroidism, unspecified: Secondary | ICD-10-CM | POA: Diagnosis not present

## 2024-04-29 DIAGNOSIS — Z0000 Encounter for general adult medical examination without abnormal findings: Secondary | ICD-10-CM | POA: Diagnosis not present

## 2024-04-29 DIAGNOSIS — E785 Hyperlipidemia, unspecified: Secondary | ICD-10-CM | POA: Diagnosis not present

## 2024-04-29 DIAGNOSIS — Z79899 Other long term (current) drug therapy: Secondary | ICD-10-CM | POA: Diagnosis not present

## 2024-05-08 DIAGNOSIS — I25119 Atherosclerotic heart disease of native coronary artery with unspecified angina pectoris: Secondary | ICD-10-CM | POA: Diagnosis not present

## 2024-05-08 DIAGNOSIS — I428 Other cardiomyopathies: Secondary | ICD-10-CM | POA: Diagnosis not present

## 2024-05-08 DIAGNOSIS — K2930 Chronic superficial gastritis without bleeding: Secondary | ICD-10-CM | POA: Diagnosis not present

## 2024-05-12 ENCOUNTER — Encounter

## 2024-05-23 DIAGNOSIS — L300 Nummular dermatitis: Secondary | ICD-10-CM | POA: Diagnosis not present

## 2024-05-23 DIAGNOSIS — L299 Pruritus, unspecified: Secondary | ICD-10-CM | POA: Diagnosis not present

## 2024-06-02 DIAGNOSIS — R21 Rash and other nonspecific skin eruption: Secondary | ICD-10-CM | POA: Diagnosis not present

## 2024-06-02 DIAGNOSIS — M79604 Pain in right leg: Secondary | ICD-10-CM | POA: Diagnosis not present

## 2024-06-02 DIAGNOSIS — M79605 Pain in left leg: Secondary | ICD-10-CM | POA: Diagnosis not present

## 2024-06-03 DIAGNOSIS — L570 Actinic keratosis: Secondary | ICD-10-CM | POA: Diagnosis not present

## 2024-06-03 DIAGNOSIS — L739 Follicular disorder, unspecified: Secondary | ICD-10-CM | POA: Diagnosis not present

## 2024-06-03 DIAGNOSIS — L578 Other skin changes due to chronic exposure to nonionizing radiation: Secondary | ICD-10-CM | POA: Diagnosis not present

## 2024-06-03 DIAGNOSIS — C44329 Squamous cell carcinoma of skin of other parts of face: Secondary | ICD-10-CM | POA: Diagnosis not present

## 2024-06-03 DIAGNOSIS — L821 Other seborrheic keratosis: Secondary | ICD-10-CM | POA: Diagnosis not present

## 2024-06-30 DIAGNOSIS — Z23 Encounter for immunization: Secondary | ICD-10-CM | POA: Diagnosis not present

## 2024-07-24 NOTE — Progress Notes (Signed)
 Cardiology Office Note:    Date:  07/28/2024   ID:  Allen [name], DOB [birth year], MRN [id number]  PCP:  Street, Allen HERO, MD  Cardiologist:  Redell Leiter, MD    Referring MD: [address], Allen [name], *    ASSESSMENT:    1. Paroxysmal atrial fibrillation (HCC)   2. Chronic anticoagulation   3. Hypertensive heart disease without heart failure   4. Mixed hyperlipidemia   5. Coronary artery disease involving native coronary artery of native heart without angina pectoris   6. Syncope, unspecified syncope type    PLAN:    In order of problems listed above:  Allen [name] is doing well no recurrent atrial fibrillation his loop recorder is now followed through cardiology [facility] Continue his anticoagulant Blood pressure at target with weight loss physical activity and lisinopril continue the same Low but is at target continue his statin Stable no anginal discomfort on good medical therapy No recurrence of syncope has implanted loop recorder now followed by [facility]   Next appointment: 1 year   Medication Adjustments/Labs and Tests Ordered: Current medicines are reviewed at length with the patient today.  Concerns regarding medicines are outlined above.  No orders of the defined types were placed in this encounter.  No orders of the defined types were placed in this encounter.    History of Present Illness:    Allen [name] is a 83 y.o. male with a hx of paroxysmal atrial fibrillation maintaining sinus rhythm after EP catheter ablation chronic anticoagulation with a history of previous stroke hypertensive heart disease without heart failure coronary artery disease abdominal aortic aneurysm with successful EVAR hypothyroidism and hyper lipidemia last seen by me 10/30/2023.  Implanted loop recorder followed in OUR practice.  Most recent device check July showed normal function no new symptoms tachy bradycardia or pause events.  Most recent ejection fraction 56% May  2024  Compliance with diet, lifestyle and medications: Yes he is doing well Exercising not having cardiovascular symptoms edema shortness of breath chest pain palpitation or syncope Follow-up labs ordered February Labs in August on target in our office Past Medical History:  Diagnosis Date   AAA (abdominal aortic aneurysm)    Acute hemorrhagic gastritis 04/25/2023   Feb 08, 2016 Entered By: BONNY DANELLE DEL Comment: [place] endoscopy egd: acute gastritis w bleeding.     Aneurysm of abdominal vessel 08/20/2012   IMO SNOMED Dx Update Oct 2024     Atrial fibrillation [facility])    Atrial flutter (HCC) 11/05/2019   Atypical chest pain 10/01/2017   Basal cell carcinoma of nasolabial groove 04/25/2023   Nov 15, 2016 Entered By: BONNY DANELLE DEL Comment: Private dermatology.     Benign neoplasm of left choroid 04/25/2023   Benign prostatic hyperplasia without lower urinary tract symptoms 04/25/2023   Cardiomyopathy, nonischemic (HCC) 10/28/2018   Chest discomfort 10/28/2018   CHF (congestive heart failure) (HCC)    Chronic anticoagulation 02/12/2020   Chronic ischemic heart disease, unspecified 10/25/2023   DOE (dyspnea on exertion) 10/01/2017   Dyslipidemia 10/01/2017   Encounter for immunization 04/25/2023   Genital herpes 04/25/2023   Groin pain, right 06/28/2017   Hypertension    Hypertensive heart disease 02/12/2020   Irregular heart beat    Lower abdominal pain 08/23/2021   Malaise and fatigue 11/05/2019   Murmur 11/05/2019   Obesity 04/25/2023   Other reactions to severe stress 04/25/2023   Other secondary cataract, right eye 10/25/2023   Palpitations 11/05/2019   Paroxysmal atrial fibrillation (  HCC) 04/18/2022   Periumbilical pain 05/16/2016   Postprocedural pelvic peritoneal adhesions 04/25/2023   Prostatitis    PSVT (paroxysmal supraventricular tachycardia) 05/22/2017   Formatting of this note might be different from the original. Prior ablation 2006   Pulmonary nodule  12/06/2022   Supraumbilical hernia 05/16/2016   Thyroid  disease    Hypothyrodidism   Transient ischemic attack 04/25/2023   Typical atrial flutter (HCC) 04/25/2023   Varicose veins of bilateral lower extremities with other complications 11/19/2012   IMO SNOMED Dx Update Oct 2024      Current Medications: Current Meds  Medication Sig   apixaban  (ELIQUIS ) 5 MG TABS tablet Take 1 tablet (5 mg total) by mouth 2 (two) times daily.   carboxymethylcellulose (REFRESH TEARS) 0.5 % SOLN Place 1 drop into both eyes daily as needed (dry eyes).   diphenhydrAMINE (BENADRYL) 25 mg capsule Take 25-50 mg by mouth every 6 (six) hours as needed (Sinus).   finasteride  (PROSCAR ) 5 MG tablet Take 5 mg by mouth daily.   levothyroxine  (SYNTHROID ) 150 MCG tablet Take 150 mcg by mouth daily before breakfast.   lisinopril (ZESTRIL) 10 MG tablet Take 5 mg by mouth daily.   polyethylene glycol (MIRALAX / GLYCOLAX) 17 g packet Take 17 g by mouth as needed for mild constipation or moderate constipation.   PSYLLIUM PO Take 15 mLs by mouth 3 (three) times daily as needed (Constipation).   simvastatin  (ZOCOR ) 20 MG tablet Take 10 mg by mouth at bedtime.   tadalafil  (CIALIS ) 5 MG tablet Take 5 mg by mouth daily.   Tamsulosin  HCl (FLOMAX ) 0.4 MG CAPS Take 0.4 mg by mouth in the morning. Reported on 01/04/2016   valACYclovir (VALTREX) 500 MG tablet Take 500 mg by mouth daily.   [DISCONTINUED] omeprazole (PRILOSEC) 20 MG capsule Take 20 mg by mouth daily.      EKGs/Labs/Other Studies Reviewed:    The following studies were reviewed today:  Cardiac Studies & Procedures   ______________________________________________________________________________________________   STRESS TESTS  NM PET CT CARDIAC PERFUSION MULTI W/ABSOLUTE BLOODFLOW 01/23/2023  Narrative   The study is normal. The study is low risk.   LV perfusion is normal. There is no evidence of ischemia. There is no evidence of infarction.   Rest left  ventricular function is normal. Rest EF: 56 %. Stress left ventricular function is normal. Stress EF: 74 %. End diastolic cavity size is normal. End systolic cavity size is normal.   Myocardial blood flow was computed to be 0.8ml/g/min at rest and 1.35ml/g/min at stress. Global myocardial blood flow reserve was 2.45 and was normal.   Coronary calcium was present on the attenuation correction CT images. Severe coronary calcifications were present. Coronary calcifications were present in the left anterior descending artery, left circumflex artery and right coronary artery distribution(s).   Electronically Signed: Powell Sorrow, MD  CLINICAL DATA:  This over-read does not include interpretation of cardiac or coronary anatomy or pathology. The cardiac PET-CT interpretation by the cardiologist is attached.  COMPARISON:  Coronary CTA 08/15/2022  FINDINGS: Vascular: Normal caliber ascending thoracic aorta. Stable atherosclerotic calcifications. Advanced three-vessel coronary artery calcifications.  Mediastinum/Nodes: No mediastinal or hilar mass or lymphadenopathy. The esophagus is grossly normal.  Lungs/Pleura: No acute pulmonary findings. No worrisome pulmonary lesions. No pleural effusions.  Upper Abdomen: No significant upper abdominal findings.  Musculoskeletal: No significant bony findings.  IMPRESSION: 1. No mediastinal or hilar mass or adenopathy. 2. No acute pulmonary findings or worrisome pulmonary lesions. 3. Coronary artery calcifications.  Aortic Atherosclerosis (ICD10-I70.0).   Electronically Signed By: MYRTIS Stammer M.D. On: 01/23/2023 12:46   ECHOCARDIOGRAM  ECHOCARDIOGRAM COMPLETE 07/07/2021  Narrative ECHOCARDIOGRAM REPORT    Patient Name:   RAIDEN HAYDU Date of Exam: 07/07/2021 Medical Rec #:  [id number]        Height:       72.0 in Accession #:    [id number]       Weight:       196.0 lb Date of Birth:  1941/03/13         BSA:          2.112  m Patient Age:    83 years         BP:           134/70 mmHg Patient Gender: M                HR:           52 bpm. Exam Location:  [place]  Procedure: 2D Echo, Cardiac Doppler, Color Doppler and Strain Analysis  Indications:    Paroxysmal atrial fibrillation (HCC) [I48.0 (ICD-10-CM)]  History:        Patient has no prior history of Echocardiogram examinations. CHF and Cardiomyopathy; Signs/Symptoms:Hypertensive Heart Disease, Murmur and Fatigue.  Sonographer:    Lynwood Silvas RDCS Referring Phys: [id number] Jeree Delcid J Kristie Bracewell  IMPRESSIONS   1. GLS -15.6. Left ventricular ejection fraction, by estimation, is 60 to 65%. The left ventricle has normal function. The left ventricle has no regional wall motion abnormalities. The left ventricular internal cavity size was mildly dilated. There is mild left ventricular hypertrophy. Left ventricular diastolic parameters are consistent with Grade II diastolic dysfunction (pseudonormalization). 2. Right ventricular systolic function is normal. The right ventricular size is normal. There is normal pulmonary artery systolic pressure. 3. The mitral valve is normal in structure. Mild mitral valve regurgitation. No evidence of mitral stenosis. 4. The aortic valve is normal in structure. Aortic valve regurgitation is mild. No aortic stenosis is present. 5. There is mild dilatation of the ascending aorta, measuring 39 mm. 6. The inferior vena cava is normal in size with greater than 50% respiratory variability, suggesting right atrial pressure of 3 mmHg.  FINDINGS Left Ventricle: GLS -15.6. Left ventricular ejection fraction, by estimation, is 60 to 65%. The left ventricle has normal function. The left ventricle has no regional wall motion abnormalities. The left ventricular internal cavity size was mildly dilated. There is mild left ventricular hypertrophy. Left ventricular diastolic parameters are consistent with Grade II diastolic dysfunction  (pseudonormalization).  Right Ventricle: The right ventricular size is normal. No increase in right ventricular wall thickness. Right ventricular systolic function is normal. There is normal pulmonary artery systolic pressure. The tricuspid regurgitant velocity is 2.52 m/s, and with an assumed right atrial pressure of 3 mmHg, the estimated right ventricular systolic pressure is 28.4 mmHg.  Left Atrium: Left atrial size was normal in size.  Right Atrium: Right atrial size was normal in size.  Pericardium: There is no evidence of pericardial effusion.  Mitral Valve: The mitral valve is normal in structure. Mild mitral valve regurgitation. No evidence of mitral valve stenosis.  Tricuspid Valve: The tricuspid valve is normal in structure. Tricuspid valve regurgitation is mild . No evidence of tricuspid stenosis.  Aortic Valve: The aortic valve is normal in structure. Aortic valve regurgitation is mild. No aortic stenosis is present.  Pulmonic Valve: The pulmonic valve was normal in structure. Pulmonic valve regurgitation is not  visualized. No evidence of pulmonic stenosis.  Aorta: The aortic root is normal in size and structure. There is mild dilatation of the ascending aorta, measuring 39 mm.  Venous: The inferior vena cava is normal in size with greater than 50% respiratory variability, suggesting right atrial pressure of 3 mmHg.  IAS/Shunts: No atrial level shunt detected by color flow Doppler.   LEFT VENTRICLE PLAX 2D LVIDd:         6.00 cm   Diastology LVIDs:         3.80 cm   LV e' medial:    6.96 cm/s LV PW:         1.20 cm   LV E/e' medial:  8.4 LV IVS:        1.20 cm   LV e' lateral:   10.30 cm/s LVOT diam:     2.30 cm   LV E/e' lateral: 5.7 LV SV:         91 LV SV Index:   43 LVOT Area:     4.15 cm   RIGHT VENTRICLE             IVC RV S prime:     10.20 cm/s  IVC diam: 1.50 cm TAPSE (M-mode): 2.6 cm  LEFT ATRIUM             Index        RIGHT ATRIUM            Index LA Vol (A2C):   90.7 ml 42.94 ml/m  RA Area:     19.90 cm LA Vol (A4C):   65.9 ml 31.20 ml/m  RA Volume:   50.20 ml  23.77 ml/m LA Biplane Vol: 78.8 ml 37.30 ml/m AORTIC VALVE LVOT Vmax:   91.40 cm/s LVOT Vmean:  64.800 cm/s LVOT VTI:    0.220 m  AORTA Ao Root diam: 3.80 cm Ao Asc diam:  3.85 cm Ao Desc diam: 2.20 cm  MITRAL VALVE               TRICUSPID VALVE MV Area (PHT): 2.77 cm    TR Peak grad:   25.4 mmHg MV Decel Time: 274 msec    TR Vmax:        252.00 cm/s MV E velocity: 58.70 cm/s MV A velocity: 48.40 cm/s  SHUNTS MV E/A ratio:  1.21        Systemic VTI:  0.22 m Systemic Diam: 2.30 cm  Lamar Fitch MD Electronically signed by Lamar Fitch MD Signature Date/Time: 07/07/2021/5:00:02 PM    Final    MONITORS  LONG TERM MONITOR (3-14 DAYS) 03/11/2020  Narrative A ZIO monitor was performed for 3 days beginning 02/18/2020 to assess paroxysmal atrial fibrillation with palpitation.  The cardiac rhythm throughout was sinus with average heart rate 54 bpm minimum 44 maximum 99 bpm.  There are no pauses of 3 seconds or greater and there were no episodes of second or third-degree AV block or sinus node exit block.  Ventricular ectopy was rare with isolated PVCs.  Supraventricular arrhythmia was occasional APCs.  There were no episodes of atrial fibrillation or flutter.  There were 2 brief runs of atrial premature contractions the longest 6 complexes at a rate of 97 bpm.  There were 4 triggered and 1 diary events.  2 were frequent APCs 1 frequent PVCs and the other to sinus rhythm.   Conclusion, rare ventricular ectopy occasional supraventricular ectopy and triggered and diary events associated with APCs and PVCs.  ______________________________________________________________________________________________          Recent Labs: 10/30/2023: ALT 10; BUN 12; Creatinine, Ser 1.09; Hemoglobin 12.8; Platelets 200; Potassium 5.3; Sodium 136; TSH  1.760  Recent Lipid Panel    Component Value Date/Time   CHOL 111 10/30/2023 0854   TRIG 55 10/30/2023 0854   HDL 44 10/30/2023 0854   CHOLHDL 2.5 10/30/2023 0854   LDLCALC 54 10/30/2023 0854    Physical Exam:    VS:  BP 136/70   Pulse 60   Ht 6' (1.829 m)   Wt 185 lb 9.6 oz (84.2 kg)   SpO2 99%   BMI 25.17 kg/m     Wt Readings from Last 3 Encounters:  07/28/24 185 lb 9.6 oz (84.2 kg)  10/30/23 196 lb 3.2 oz (89 kg)  09/10/23 194 lb (88 kg)     GEN:  Well nourished, well developed in no acute distress HEENT: Normal NECK: No JVD; No carotid bruits LYMPHATICS: No lymphadenopathy CARDIAC: RRR, no murmurs, rubs, gallops RESPIRATORY:  Clear to auscultation without rales, wheezing or rhonchi  ABDOMEN: Soft, non-tender, non-distended MUSCULOSKELETAL:  No edema; No deformity  SKIN: Warm and dry NEUROLOGIC:  Alert and oriented x 3 PSYCHIATRIC:  Normal affect    Signed, Redell Leiter, MD  07/28/2024 9:15 AM    [facility]

## 2024-07-28 ENCOUNTER — Encounter: Payer: Self-pay | Admitting: Cardiology

## 2024-07-28 ENCOUNTER — Ambulatory Visit: Attending: Cardiology | Admitting: Cardiology

## 2024-07-28 VITALS — BP 136/70 | HR 60 | Ht 72.0 in | Wt 185.6 lb

## 2024-07-28 DIAGNOSIS — Z7901 Long term (current) use of anticoagulants: Secondary | ICD-10-CM | POA: Diagnosis not present

## 2024-07-28 DIAGNOSIS — I2510 Atherosclerotic heart disease of native coronary artery without angina pectoris: Secondary | ICD-10-CM

## 2024-07-28 DIAGNOSIS — I480 Paroxysmal atrial fibrillation: Secondary | ICD-10-CM

## 2024-07-28 DIAGNOSIS — E782 Mixed hyperlipidemia: Secondary | ICD-10-CM

## 2024-07-28 DIAGNOSIS — I119 Hypertensive heart disease without heart failure: Secondary | ICD-10-CM | POA: Diagnosis not present

## 2024-07-28 DIAGNOSIS — R55 Syncope and collapse: Secondary | ICD-10-CM

## 2024-07-28 NOTE — Addendum Note (Signed)
 Addended by: SHERRE ADE I on: 07/28/2024 09:26 AM   Modules accepted: Orders

## 2024-07-28 NOTE — Patient Instructions (Signed)
 Medication Instructions:  Your physician recommends that you continue on your current medications as directed. Please refer to the Current Medication list given to you today.  *If you need a refill on your cardiac medications before your next appointment, please call your pharmacy*  Lab Work: Your physician recommends that you return for lab work in:   Labs in February: CMP, Lipids, TSH T3 T4  If you have labs (blood work) drawn today and your tests are completely normal, you will receive your results only by: MyChart Message (if you have MyChart) OR A paper copy in the mail If you have any lab test that is abnormal or we need to change your treatment, we will call you to review the results.  Testing/Procedures: None  Follow-Up: At [facility], you and your health needs are our priority.  As part of our continuing mission to provide you with exceptional heart care, our providers are all part of one team.  This team includes your primary Cardiologist (physician) and Advanced Practice Providers or APPs (Physician Assistants and Nurse Practitioners) who all work together to provide you with the care you need, when you need it.  Your next appointment:   1 year(s)  Provider:   Redell Leiter, MD    We recommend signing up for the patient portal called MyChart.  Sign up information is provided on this After Visit Summary.  MyChart is used to connect with patients for Virtual Visits (Telemedicine).  Patients are able to view lab/test results, encounter notes, upcoming appointments, etc.  Non-urgent messages can be sent to your provider as well.   To learn more about what you can do with MyChart, go to [email or web address].   Other Instructions None

## 2024-10-10 ENCOUNTER — Other Ambulatory Visit: Payer: Self-pay

## 2024-10-10 DIAGNOSIS — I484 Atypical atrial flutter: Secondary | ICD-10-CM

## 2024-11-03 ENCOUNTER — Ambulatory Visit: Admitting: Cardiology
